# Patient Record
Sex: Female | Born: 1950 | Race: White | Hispanic: No | Smoking: Never smoker
Health system: Southern US, Community
[De-identification: ages and names within clinical notes are randomized; demographics above are authoritative.]

## PROBLEM LIST (undated history)

## (undated) DIAGNOSIS — R87629 Unspecified abnormal cytological findings in specimens from vagina: Secondary | ICD-10-CM

## (undated) DIAGNOSIS — G709 Myoneural disorder, unspecified: Secondary | ICD-10-CM

## (undated) DIAGNOSIS — M1990 Unspecified osteoarthritis, unspecified site: Secondary | ICD-10-CM

## (undated) DIAGNOSIS — I10 Essential (primary) hypertension: Secondary | ICD-10-CM

## (undated) DIAGNOSIS — K219 Gastro-esophageal reflux disease without esophagitis: Secondary | ICD-10-CM

## (undated) DIAGNOSIS — F419 Anxiety disorder, unspecified: Secondary | ICD-10-CM

## (undated) DIAGNOSIS — F32A Depression, unspecified: Secondary | ICD-10-CM

## (undated) DIAGNOSIS — E119 Type 2 diabetes mellitus without complications: Secondary | ICD-10-CM

## (undated) HISTORY — DX: Gastro-esophageal reflux disease without esophagitis: K21.9

## (undated) HISTORY — DX: Type 2 diabetes mellitus without complications: E11.9

## (undated) HISTORY — DX: Anxiety disorder, unspecified: F41.9

## (undated) HISTORY — PX: ABDOMINAL HYSTERECTOMY: SHX81

## (undated) HISTORY — PX: CERVIX LESION DESTRUCTION: SHX591

## (undated) HISTORY — DX: Essential (primary) hypertension: I10

## (undated) HISTORY — PX: JOINT REPLACEMENT: SHX530

## (undated) HISTORY — DX: Depression, unspecified: F32.A

## (undated) HISTORY — DX: Unspecified abnormal cytological findings in specimens from vagina: R87.629

## (undated) HISTORY — DX: Unspecified osteoarthritis, unspecified site: M19.90

## (undated) HISTORY — PX: ARTHROPLASTY: SHX135

## (undated) HISTORY — DX: Myoneural disorder, unspecified: G70.9

## (undated) HISTORY — PX: TUBAL LIGATION: SHX77

---

## 1994-03-19 DIAGNOSIS — I10 Essential (primary) hypertension: Secondary | ICD-10-CM

## 1994-03-19 HISTORY — DX: Essential (primary) hypertension: I10

## 2005-09-17 ENCOUNTER — Encounter: Payer: Self-pay | Admitting: Family Medicine

## 2005-10-16 ENCOUNTER — Ambulatory Visit: Payer: Self-pay | Admitting: Family Medicine

## 2005-11-08 ENCOUNTER — Ambulatory Visit: Payer: Self-pay | Admitting: Family Medicine

## 2005-12-20 ENCOUNTER — Ambulatory Visit: Payer: Self-pay | Admitting: Family Medicine

## 2005-12-25 DIAGNOSIS — G4700 Insomnia, unspecified: Secondary | ICD-10-CM | POA: Insufficient documentation

## 2005-12-25 DIAGNOSIS — I10 Essential (primary) hypertension: Secondary | ICD-10-CM | POA: Insufficient documentation

## 2005-12-25 DIAGNOSIS — F339 Major depressive disorder, recurrent, unspecified: Secondary | ICD-10-CM | POA: Insufficient documentation

## 2005-12-31 ENCOUNTER — Encounter: Payer: Self-pay | Admitting: Family Medicine

## 2006-03-06 ENCOUNTER — Ambulatory Visit: Payer: Self-pay | Admitting: Family Medicine

## 2006-03-21 ENCOUNTER — Ambulatory Visit: Payer: Self-pay | Admitting: Family Medicine

## 2006-05-20 ENCOUNTER — Ambulatory Visit: Payer: Self-pay | Admitting: Family Medicine

## 2006-05-20 DIAGNOSIS — G2581 Restless legs syndrome: Secondary | ICD-10-CM | POA: Insufficient documentation

## 2006-06-14 ENCOUNTER — Telehealth: Payer: Self-pay | Admitting: Family Medicine

## 2006-07-08 ENCOUNTER — Ambulatory Visit: Payer: Self-pay | Admitting: Family Medicine

## 2006-09-04 ENCOUNTER — Ambulatory Visit: Payer: Self-pay | Admitting: Family Medicine

## 2006-09-04 DIAGNOSIS — J019 Acute sinusitis, unspecified: Secondary | ICD-10-CM | POA: Insufficient documentation

## 2006-10-03 ENCOUNTER — Ambulatory Visit: Payer: Self-pay | Admitting: Family Medicine

## 2006-11-15 ENCOUNTER — Telehealth: Payer: Self-pay | Admitting: Family Medicine

## 2006-11-19 ENCOUNTER — Ambulatory Visit: Payer: Self-pay | Admitting: Family Medicine

## 2006-11-19 DIAGNOSIS — H66009 Acute suppurative otitis media without spontaneous rupture of ear drum, unspecified ear: Secondary | ICD-10-CM | POA: Insufficient documentation

## 2006-11-27 ENCOUNTER — Telehealth (INDEPENDENT_AMBULATORY_CARE_PROVIDER_SITE_OTHER): Payer: Self-pay | Admitting: *Deleted

## 2006-11-28 ENCOUNTER — Ambulatory Visit: Payer: Self-pay | Admitting: Family Medicine

## 2006-12-12 ENCOUNTER — Ambulatory Visit: Payer: Self-pay | Admitting: Family Medicine

## 2006-12-12 ENCOUNTER — Encounter: Payer: Self-pay | Admitting: Family Medicine

## 2006-12-12 DIAGNOSIS — R5383 Other fatigue: Secondary | ICD-10-CM

## 2006-12-12 DIAGNOSIS — R5381 Other malaise: Secondary | ICD-10-CM | POA: Insufficient documentation

## 2006-12-13 ENCOUNTER — Encounter: Payer: Self-pay | Admitting: Family Medicine

## 2006-12-13 LAB — CONVERTED CEMR LAB
Albumin: 4.2 g/dL (ref 3.5–5.2)
CO2: 24 meq/L (ref 19–32)
Calcium: 9.6 mg/dL (ref 8.4–10.5)
Chloride: 104 meq/L (ref 96–112)
Cholesterol, target level: 200 mg/dL
Cholesterol: 219 mg/dL — ABNORMAL HIGH (ref 0–200)
Glucose, Bld: 102 mg/dL — ABNORMAL HIGH (ref 70–99)
HDL goal, serum: 40 mg/dL
Hemoglobin: 12.7 g/dL (ref 12.0–15.0)
LDL Goal: 160 mg/dL
MCV: 93.8 fL (ref 78.0–100.0)
Potassium: 4 meq/L (ref 3.5–5.3)
RDW: 12.9 % (ref 11.5–14.0)
Sodium: 141 meq/L (ref 135–145)
TSH: 1.596 microintl units/mL (ref 0.350–5.50)
Total CHOL/HDL Ratio: 5.2
Total Protein: 7.1 g/dL (ref 6.0–8.3)

## 2007-01-13 ENCOUNTER — Ambulatory Visit: Payer: Self-pay | Admitting: Family Medicine

## 2007-01-13 ENCOUNTER — Encounter: Admission: RE | Admit: 2007-01-13 | Discharge: 2007-01-13 | Payer: Self-pay | Admitting: Family Medicine

## 2007-01-13 DIAGNOSIS — R05 Cough: Secondary | ICD-10-CM

## 2007-01-13 DIAGNOSIS — R059 Cough, unspecified: Secondary | ICD-10-CM | POA: Insufficient documentation

## 2007-01-20 ENCOUNTER — Telehealth: Payer: Self-pay | Admitting: Family Medicine

## 2007-02-18 ENCOUNTER — Telehealth: Payer: Self-pay | Admitting: Family Medicine

## 2007-11-03 ENCOUNTER — Encounter: Admission: RE | Admit: 2007-11-03 | Discharge: 2007-11-03 | Payer: Self-pay | Admitting: Family Medicine

## 2007-11-03 ENCOUNTER — Ambulatory Visit: Payer: Self-pay | Admitting: Family Medicine

## 2007-11-03 DIAGNOSIS — R112 Nausea with vomiting, unspecified: Secondary | ICD-10-CM | POA: Insufficient documentation

## 2007-11-06 LAB — CONVERTED CEMR LAB
ALT: 11 units/L (ref 0–35)
AST: 15 units/L (ref 0–37)
Alkaline Phosphatase: 82 units/L (ref 39–117)
BUN: 23 mg/dL (ref 6–23)
Basophils Relative: 1 % (ref 0–1)
Creatinine, Ser: 1.06 mg/dL (ref 0.40–1.20)
Eosinophils Absolute: 0.1 10*3/uL (ref 0.0–0.7)
Eosinophils Relative: 1 % (ref 0–5)
Glucose, Bld: 137 mg/dL — ABNORMAL HIGH (ref 70–99)
Lymphs Abs: 2.5 10*3/uL (ref 0.7–4.0)
MCV: 94.8 fL (ref 78.0–100.0)
Neutro Abs: 7.1 10*3/uL (ref 1.7–7.7)
RBC: 4.6 M/uL (ref 3.87–5.11)
RDW: 12.9 % (ref 11.5–15.5)
Total Bilirubin: 0.4 mg/dL (ref 0.3–1.2)
Total Protein: 8 g/dL (ref 6.0–8.3)
WBC: 10.5 10*3/uL (ref 4.0–10.5)

## 2007-11-13 ENCOUNTER — Telehealth: Payer: Self-pay | Admitting: Family Medicine

## 2007-12-23 ENCOUNTER — Encounter: Payer: Self-pay | Admitting: Family Medicine

## 2008-06-22 ENCOUNTER — Telehealth: Payer: Self-pay | Admitting: Family Medicine

## 2008-07-26 ENCOUNTER — Telehealth (INDEPENDENT_AMBULATORY_CARE_PROVIDER_SITE_OTHER): Payer: Self-pay | Admitting: *Deleted

## 2008-12-08 ENCOUNTER — Encounter: Admission: RE | Admit: 2008-12-08 | Discharge: 2008-12-08 | Payer: Self-pay | Admitting: Family Medicine

## 2008-12-08 ENCOUNTER — Ambulatory Visit: Payer: Self-pay | Admitting: Family Medicine

## 2008-12-08 DIAGNOSIS — M79609 Pain in unspecified limb: Secondary | ICD-10-CM | POA: Insufficient documentation

## 2008-12-14 ENCOUNTER — Encounter: Admission: RE | Admit: 2008-12-14 | Discharge: 2008-12-14 | Payer: Self-pay | Admitting: Family Medicine

## 2010-08-15 ENCOUNTER — Telehealth: Payer: Self-pay | Admitting: Family Medicine

## 2010-08-15 NOTE — Telephone Encounter (Signed)
Additional hernia she needs an appointment. It's been a long time since she's been here and she does not have up-to-date labs being that she is on losartan. If she has changed physicians please let us know.

## 2010-08-16 NOTE — Telephone Encounter (Signed)
Called and was unable to leave a message

## 2010-08-21 NOTE — Telephone Encounter (Signed)
Called and left message on vm

## 2010-11-23 ENCOUNTER — Ambulatory Visit (INDEPENDENT_AMBULATORY_CARE_PROVIDER_SITE_OTHER): Payer: Managed Care, Other (non HMO) | Admitting: Family Medicine

## 2010-11-23 VITALS — BP 126/86 | HR 118 | Wt 141.0 lb

## 2010-11-23 DIAGNOSIS — F339 Major depressive disorder, recurrent, unspecified: Secondary | ICD-10-CM

## 2010-11-23 MED ORDER — SERTRALINE HCL 25 MG PO TABS: 25.0000 mg | ORAL_TABLET | Freq: Every day | ORAL | Status: DC

## 2010-11-23 NOTE — Progress Notes (Signed)
  Subjective:    Patient ID: Bianca Mckee, female    DOB: 1951/02/16, 60 y.o.   MRN: [id number]  HPI Feels her mood has been worse for the last few month. Worse the last few weeks.  Sys has been more fidgety.  She moved out with living with her father and this has helped her mentally. But now stressed because she is not there to help himm. Not in a relationship. She says she doesn't want to go out socially and now having a hard time going to work in the mornings.  She feels socially isolated.  She does occ go out to eat with a girlfirend.  Won't leave house the entire weekend unti lhas to go back to work on Monday.  Did Well on the paxil but caused some weight gain. Felt fluoxetine didn't help her.  She would like to try sertraline. Waking up frequently at night.  Has been using melatonin at night.   Says she was dx with elevated sugas but not DM last April. She said she has blood work done then as well.   Review of Systems     Objective:   Physical Exam  Constitutional: She is oriented to person, place, and time. She appears well-developed and well-nourished.  HENT:  Head: Normocephalic and atraumatic.  Cardiovascular: Normal rate, regular rhythm and normal heart sounds.   Pulmonary/Chest: Effort normal and breath sounds normal.  Neurological: She is alert and oriented to person, place, and time.  Skin: Skin is warm and dry.  Psychiatric: She has a normal mood and affect. Her behavior is normal.          Assessment & Plan:

## 2010-11-23 NOTE — Assessment & Plan Note (Signed)
PHQ - 9 score of 19 and GAD- 7 score of 19.  Discussed dx. Will start sertraline. F/U in 3 weeks. Discussed potential SE.  Discussed expected course.

## 2010-11-24 ENCOUNTER — Telehealth: Payer: Self-pay | Admitting: *Deleted

## 2010-11-24 MED ORDER — ALPRAZOLAM 1 MG PO TABS: ORAL_TABLET | ORAL | Status: DC

## 2010-11-24 NOTE — Telephone Encounter (Signed)
Pt came into office and asked if you would prescribe her some Xanax until her other meds kicked in- forgot to ask at her appt yesterday. Please advise

## 2010-11-24 NOTE — Telephone Encounter (Signed)
pT NOTIFEID. KJ LPN

## 2010-11-24 NOTE — Telephone Encounter (Signed)
OK will fax over. She has had these before.

## 2010-11-30 ENCOUNTER — Encounter: Payer: Self-pay | Admitting: Family Medicine

## 2010-12-21 ENCOUNTER — Encounter: Payer: Self-pay | Admitting: Family Medicine

## 2010-12-21 ENCOUNTER — Ambulatory Visit (INDEPENDENT_AMBULATORY_CARE_PROVIDER_SITE_OTHER): Payer: Managed Care, Other (non HMO) | Admitting: Family Medicine

## 2010-12-21 VITALS — BP 119/69 | HR 74 | Wt 144.0 lb

## 2010-12-21 DIAGNOSIS — R7309 Other abnormal glucose: Secondary | ICD-10-CM

## 2010-12-21 DIAGNOSIS — E8881 Metabolic syndrome: Secondary | ICD-10-CM

## 2010-12-21 DIAGNOSIS — F339 Major depressive disorder, recurrent, unspecified: Secondary | ICD-10-CM

## 2010-12-21 DIAGNOSIS — G4700 Insomnia, unspecified: Secondary | ICD-10-CM

## 2010-12-21 LAB — POCT GLYCOSYLATED HEMOGLOBIN (HGB A1C): Hemoglobin A1C: 6.3

## 2010-12-21 MED ORDER — GLUCOSE BLOOD VI STRP: ORAL_STRIP | Status: AC

## 2010-12-21 MED ORDER — BUPROPION HCL ER (XL) 150 MG PO TB24: 150.0000 mg | ORAL_TABLET | ORAL | Status: DC

## 2010-12-21 MED ORDER — SERTRALINE HCL 25 MG PO TABS: 25.0000 mg | ORAL_TABLET | Freq: Every day | ORAL | Status: DC

## 2010-12-21 NOTE — Patient Instructions (Signed)
Decrease sertraline to one a day for one week, then every other day for for one week, then stop.

## 2010-12-21 NOTE — Progress Notes (Signed)
  Subjective:    Patient ID: Bianca Mckee, female    DOB: 1951/03/15, 60 y.o.   MRN: [id number]  HPI Insomnia - Alteril for sleep.  It has melatonin and valerian root as well. Says not sleeping well  Says helps some nights but not consistent.  Night time hot flashes have started again.    Tried the sertaline and says did well. Says her mood has been better. No SE ucrrently. She has gained 3 lbs and she is worried about it.  She is interested in changing to Wellbutrin bc she feels it could also help with her ADD as well.   Hx of abnormal glucose.  Says would like refill on her strips.   Review of Systems     Objective:   Physical Exam  Constitutional: She is oriented to person, place, and time. She appears well-developed and well-nourished.  HENT:  Head: Normocephalic and atraumatic.  Cardiovascular: Normal rate, regular rhythm and normal heart sounds.   Pulmonary/Chest: Effort normal and breath sounds normal.  Neurological: She is alert and oriented to person, place, and time.  Skin: Skin is warm and dry.  Psychiatric: She has a normal mood and affect. Her behavior is normal.          Assessment & Plan:  INsomnia- I really want to get her mood much better and see if that helps sleep as well as her sleep difficulty is from racing thoughts when she tries to go to bed.. Can continue the herbal supplement since helps some.    Depression - PHQ- 9 score of 7 today which is better.  She feels pretty sure that the sertraline is causing weight gain he finishes a very low dose. She would really like to try switching to Wellbutrin. I have no problem with this but I did warn her that the Wellbutrin may or may not be enough to control moderate to severe symptoms. Her father was recently put in a nursing home and this should take a lot of added stress off of her since she was his primary caretaker. Hopefully this will help as well. We will start Wellbutrin 150 mg extended release once a day.  Followup in 6 weeks.  Abnormal glucose-she said that she was told by her nephrologist that her glucose was elevated. Since and she's made significant changes to lose a large amount of weight which she has unsuccessfully. She would like a new prescription for the strips. Also check an A1c today. 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 Recheck in 6 months. Continue to eat healthy diet and maintain a healthy weight.

## 2011-01-04 ENCOUNTER — Telehealth: Payer: Self-pay | Admitting: Family Medicine

## 2011-01-04 NOTE — Telephone Encounter (Signed)
Pt called and was recently placed on wellbutrin 150 mg am and switched from zoloft med.  Pt says she can't sleep at night, jittery inside, and med is making more nervous.  Pt is requesting to change back to original med or do something diff. Plan:  Routed encounter to Dr. Thurmond Butts as pt is to return in 6 wks from 12-21-10, but pt stated she couldn't wait that long to change the medication. Jarvis Newcomer, LPN [facility]

## 2011-01-05 NOTE — Telephone Encounter (Signed)
Bianca Mckee I would keep the wellbutrin onboard since it has other useful properties but we can restart the zoloft again for her anxiety. . She may take both it looks like she was on 25 mg daily. She can have 30 w/4 refills.

## 2011-01-08 ENCOUNTER — Telehealth: Payer: Self-pay | Admitting: Family Medicine

## 2011-01-08 NOTE — Telephone Encounter (Signed)
The jitteriness is usually the zoloft getting out of her system. This should get better after about a week.  I really don't think this is the wellbutrin.  But if she really wants to go back on th zoloft she can. We can always try a lower dose.

## 2011-01-08 NOTE — Telephone Encounter (Signed)
Pt last took zoloft this past Saturday and when she was notified this am she said she spoke to [place] last Friday and she told her to take the wellbutrin every other day.  Pt would prefer Dr. Linford Arnold evaluate message because the pt feels the wellbutrin is causing the jitters.  Please advise. Plan:  Routed to Dr. Linford Arnold. Jarvis Newcomer, LPN [facility]

## 2011-01-08 NOTE — Telephone Encounter (Signed)
Re sent again to Dr. Linford Arnold. Jarvis Newcomer, LPN [facility]

## 2011-01-09 NOTE — Telephone Encounter (Signed)
Closed

## 2011-01-09 NOTE — Telephone Encounter (Signed)
Pt notified with instructions.  Told to try to hang in there for another week to see if jitterness improves.  If not told to call back. Jarvis Newcomer, LPN [facility]

## 2011-02-01 ENCOUNTER — Encounter: Payer: Self-pay | Admitting: Family Medicine

## 2011-02-01 ENCOUNTER — Ambulatory Visit (INDEPENDENT_AMBULATORY_CARE_PROVIDER_SITE_OTHER): Payer: Managed Care, Other (non HMO) | Admitting: Family Medicine

## 2011-02-01 VITALS — BP 162/101 | HR 86 | Wt 145.0 lb

## 2011-02-01 DIAGNOSIS — R635 Abnormal weight gain: Secondary | ICD-10-CM

## 2011-02-01 DIAGNOSIS — F329 Major depressive disorder, single episode, unspecified: Secondary | ICD-10-CM

## 2011-02-01 DIAGNOSIS — F32A Depression, unspecified: Secondary | ICD-10-CM

## 2011-02-01 MED ORDER — CITALOPRAM HYDROBROMIDE 20 MG PO TABS: 20.0000 mg | ORAL_TABLET | Freq: Every day | ORAL | Status: DC

## 2011-02-01 NOTE — Progress Notes (Signed)
  Subjective:    Patient ID: Bianca Mckee, female    DOB: 06-08-1950, 60 y.o.   MRN: [id number]  HPI Says still hasnt lost weight. She is eating healthy and says she is not exercising regularly.  She was down to 135 lb and wants to get back to that.  She switched to the Wellbutrin and initally had shakes and insomnia but that has gotten better.  She has also had dry mouth and constipation. She ran out of the wellbutrin a week ago.     Review of Systems     Objective:   Physical Exam  Constitutional: She is oriented to person, place, and time. She appears well-developed and well-nourished.  HENT:  Head: Normocephalic and atraumatic.  Cardiovascular: Normal rate, regular rhythm and normal heart sounds.   Pulmonary/Chest: Effort normal and breath sounds normal.  Neurological: She is alert and oriented to person, place, and time.  Skin: Skin is warm and dry.  Psychiatric: She has a normal mood and affect. Her behavior is normal.          Assessment & Plan:  Depression  - PHQ-9 score is 6. Discussed options. She feels the sertraline works better than the wellbutrin but was gaining wt on the sertraline. After much discussion we decided to try citalopram. We can always go back to the sertraline if no success with the citalopram. F.U. 1 mo.

## 2011-02-01 NOTE — Patient Instructions (Signed)
Start with half a tab 1/2 tab in the morning for one week then increase to whole tab daily.

## 2011-03-01 ENCOUNTER — Ambulatory Visit (INDEPENDENT_AMBULATORY_CARE_PROVIDER_SITE_OTHER): Payer: Managed Care, Other (non HMO) | Admitting: Family Medicine

## 2011-03-01 ENCOUNTER — Other Ambulatory Visit: Payer: Self-pay | Admitting: Family Medicine

## 2011-03-01 ENCOUNTER — Encounter: Payer: Self-pay | Admitting: Family Medicine

## 2011-03-01 VITALS — BP 105/67 | HR 89 | Wt 146.0 lb

## 2011-03-01 DIAGNOSIS — K5900 Constipation, unspecified: Secondary | ICD-10-CM

## 2011-03-01 DIAGNOSIS — F329 Major depressive disorder, single episode, unspecified: Secondary | ICD-10-CM

## 2011-03-01 DIAGNOSIS — Z1231 Encounter for screening mammogram for malignant neoplasm of breast: Secondary | ICD-10-CM

## 2011-03-01 DIAGNOSIS — M62838 Other muscle spasm: Secondary | ICD-10-CM

## 2011-03-01 DIAGNOSIS — F32A Depression, unspecified: Secondary | ICD-10-CM

## 2011-03-01 MED ORDER — CITALOPRAM HYDROBROMIDE 10 MG PO TABS: 10.0000 mg | ORAL_TABLET | Freq: Every day | ORAL | Status: DC

## 2011-03-01 NOTE — Progress Notes (Signed)
Subjective:    Patient ID: Bianca Mckee, female    DOB: May 05, 1950, 60 y.o.   MRN: [id number]  HPI F/u depression - Celexa is working well and sleeping well, but feels too sedated during the daytime.  She really liked the sertraline the past but says that it caused significant weight gain which is why we discontinued it.  Still very constipated.  Tried a stool softener and that has not helped. She wants to know what else to try. Her last bowel movement was 5 days ago.  Neck tightness. Approximately one month. She says she's been getting more frequent headaches from it. She can feel the tightness in the muscles in her neck. No trauma or injury. She has not tried heat or massage. She does occasionally try anti-inflammatory which helps some. She said getting in the shower and running the pulse setting on the shower head does seem to help.  Review of Systems BP 105/67  Pulse 89  Wt 146 lb (66.225 kg)    Allergies  Allergen Reactions  . Codeine Phosphate     Past Medical History  Diagnosis Date  . Hypertension 1996  . Anxiety   . GERD (gastroesophageal reflux disease)     Past Surgical History  Procedure Date  . Tubal ligation     History   Social History  . Marital Status: Divorced    Spouse Name: N/A    Number of Children: N/A  . Years of Education: N/A   Occupational History  . Not on file.   Social History Main Topics  . Smoking status: Never Smoker   . Smokeless tobacco: Not on file  . Alcohol Use: No  . Drug Use: No  . Sexually Active:    Other Topics Concern  . Not on file   Social History Narrative  . No narrative on file    No family history on file.    Objective:   Physical Exam  Constitutional: She is oriented to person, place, and time. She appears well-developed and well-nourished.  HENT:  Head: Normocephalic and atraumatic.  Cardiovascular: Normal rate, regular rhythm and normal heart sounds.   Pulmonary/Chest: Effort normal and breath  sounds normal.  Musculoskeletal:       Neck with normal flexion and extension. Decreased rotation right and left. Normal side bending. I can actually feel the knot of spasms on the left occiput area. She is mildly tender over the cervical paraspinous muscles.  Neurological: She is oriented to person, place, and time.  Skin: Skin is warm and dry.  Psychiatric: She has a normal mood and affect. Her behavior is normal.          Assessment & Plan:  Depression - PHQ-9 score of 6.  Her depression score has not changed and I do think she is doing well Ms. Celexa but I am concerned about the excess sedation. We will try decreasing her dose to 10 mg for couple weeks and see if this still helps her mood but avoids the daytime sedation. I also explained to her that we can always go back to the sertraline for a time.  Neck spasm - Rec masage and heating pad. Upon gentle stretching. If no significant improvement in the next couple weeks and please let me know and we can place her into formal physical therapy. Since there was no known injury off on x-ray at this point.  Constipation-and posterior get back on her MiraLax which she has used in the past and  has worked well for her. She says she finds it difficult to remember to do it daily. I explained to her that she can at least start using it twice and she has a normal bowel movement and then if she wants to do Benefiber or something like that, they do come in individual packets that you can put in her purse.

## 2011-03-01 NOTE — Patient Instructions (Signed)
Miralax daily or consider adding a fiber supplement to help with your constipation.

## 2011-03-02 ENCOUNTER — Other Ambulatory Visit: Payer: Self-pay | Admitting: Family Medicine

## 2011-03-29 ENCOUNTER — Ambulatory Visit (INDEPENDENT_AMBULATORY_CARE_PROVIDER_SITE_OTHER): Payer: Managed Care, Other (non HMO) | Admitting: Family Medicine

## 2011-03-29 ENCOUNTER — Encounter: Payer: Self-pay | Admitting: Family Medicine

## 2011-03-29 VITALS — BP 121/64 | HR 104 | Wt 145.0 lb

## 2011-03-29 DIAGNOSIS — F32A Depression, unspecified: Secondary | ICD-10-CM

## 2011-03-29 DIAGNOSIS — H9319 Tinnitus, unspecified ear: Secondary | ICD-10-CM

## 2011-03-29 DIAGNOSIS — F329 Major depressive disorder, single episode, unspecified: Secondary | ICD-10-CM

## 2011-03-29 DIAGNOSIS — F3289 Other specified depressive episodes: Secondary | ICD-10-CM

## 2011-03-29 MED ORDER — CITALOPRAM HYDROBROMIDE 20 MG PO TABS: 20.0000 mg | ORAL_TABLET | Freq: Every day | ORAL | Status: DC

## 2011-03-29 NOTE — Progress Notes (Signed)
  Subjective:    Patient ID: Bianca Mckee, female    DOB: 05/04/1950, 61 y.o.   MRN: [id number]  HPI Depression - She says feels almost paranoid that her manager is treating her differently.  Doesn't feel this way at home. Say no longer feels as tired on th emedication. She dropped down to the 10mg  for a couple of weeks and then decdied to go back up to the 20mg  tab on her own bc felt it was working better.   Left ear buzzing x 3 weeks. Normal hearing. No pain. No drainage. Never had before. No trauma.    Review of Systems     Objective:   Physical Exam  Constitutional: She is oriented to person, place, and time. She appears well-developed and well-nourished.  HENT:  Head: Normocephalic and atraumatic.       Righ and Left TM are normal.   Neurological: She is alert and oriented to person, place, and time.  Skin: Skin is warm and dry.  Psychiatric: She has a normal mood and affect. Her behavior is normal.          Assessment & Plan:  Depression - PHQ- 9 score of 4. Discussed different options. She finally decided to continue current regimen. F/U in 1 month  HTN - BP looks great.    Ear ringing - Exam is normal. No pain,.   giveiit a couple of more weeks to resolved. If not then consider ENT referral.  Gave reassurance.

## 2011-04-17 ENCOUNTER — Inpatient Hospital Stay: Admission: RE | Admit: 2011-04-17 | Payer: Managed Care, Other (non HMO) | Source: Ambulatory Visit

## 2011-04-30 ENCOUNTER — Ambulatory Visit: Payer: Managed Care, Other (non HMO) | Admitting: Family Medicine

## 2011-05-02 ENCOUNTER — Encounter: Payer: Self-pay | Admitting: *Deleted

## 2011-05-07 ENCOUNTER — Other Ambulatory Visit: Payer: Self-pay | Admitting: Family Medicine

## 2011-05-10 ENCOUNTER — Encounter: Payer: Self-pay | Admitting: Family Medicine

## 2011-05-10 ENCOUNTER — Ambulatory Visit (INDEPENDENT_AMBULATORY_CARE_PROVIDER_SITE_OTHER): Payer: Managed Care, Other (non HMO) | Admitting: Family Medicine

## 2011-05-10 DIAGNOSIS — G4700 Insomnia, unspecified: Secondary | ICD-10-CM

## 2011-05-10 DIAGNOSIS — F329 Major depressive disorder, single episode, unspecified: Secondary | ICD-10-CM

## 2011-05-10 DIAGNOSIS — F32A Depression, unspecified: Secondary | ICD-10-CM

## 2011-05-10 MED ORDER — CITALOPRAM HYDROBROMIDE 20 MG PO TABS: 20.0000 mg | ORAL_TABLET | Freq: Every day | ORAL | Status: DC

## 2011-05-10 NOTE — Progress Notes (Signed)
  Subjective:    Patient ID: Bianca Mckee, female    DOB: [birth year], 61 y.o.   MRN: [id number]  HPI  F/U depression - Says her father passed away since last here. That ha sbeen stressful but says she is at peace with it bc she didn't want him to suffer. He had been very ill. Still not feeling well.  She inc her citalopram to 20mg  on her own and thinks it may be helping her mood.  Her weight is stable. Still using Tylenol PM at night.  Helps sometimes. When she really can't sleep she will take a half of her Xanax and she's not getting to sleep for one to 2 hours. If that doesn't work she will get up again and take the other half but then by the morning she's had for a few hours sleep that she feels too sedated to get out of bed.  Review of Systems     Objective:   Physical Exam  Constitutional: She appears well-developed and well-nourished.  HENT:  Head: Normocephalic and atraumatic.  Skin: Skin is warm and dry.  Psychiatric: She has a normal mood and affect. Her behavior is normal.          Assessment & Plan:  Depression- PHQ-9 score of 7.  Doing well consider the deaht of her father. I asked her to  please call the office if she feels she would benefit from grief counseling. She says right now she feels that she's doing okay. I did rewrite her prescription for citalopram 20 mg dose. Followup in 3 months. This lasted, she has any concerns or problems.  INsmonia -dicussed options.  Discusse can use her xanax at bdtime for 4-5 days to try to reset her sleep cycle. If not then consider low dose ambien. Used seroquel for mood and sleep in the past.

## 2011-05-15 ENCOUNTER — Ambulatory Visit
Admission: RE | Admit: 2011-05-15 | Discharge: 2011-05-15 | Disposition: A | Discharge status: Home / Self Care | Payer: Managed Care, Other (non HMO) | Source: Ambulatory Visit | Attending: Family Medicine | Admitting: Family Medicine

## 2011-05-15 DIAGNOSIS — Z1231 Encounter for screening mammogram for malignant neoplasm of breast: Secondary | ICD-10-CM

## 2011-05-18 ENCOUNTER — Telehealth: Payer: Self-pay | Admitting: Family Medicine

## 2011-05-18 ENCOUNTER — Telehealth: Payer: Self-pay | Admitting: *Deleted

## 2011-05-18 MED ORDER — CITALOPRAM HYDROBROMIDE 40 MG PO TABS: 40.0000 mg | ORAL_TABLET | Freq: Every day | ORAL | Status: DC

## 2011-05-18 NOTE — Telephone Encounter (Signed)
Please call patient. Normal mammogram.  Repeat in 1 year.  

## 2011-05-18 NOTE — Telephone Encounter (Signed)
RX sent to pharmacy  

## 2011-05-18 NOTE — Telephone Encounter (Signed)
Pt states that she has been taking her Celexa 2 tabs, total of 40mg  when she thought she was taking 20mg . She would like to know if we can send a rx for the 40mg  since that is what she has been taking. States that she thought her tabs were 10mg  but they are 20mg . Please advise.

## 2011-05-18 NOTE — Telephone Encounter (Signed)
Ok to sent the 40mg  generic tabs.

## 2011-05-21 NOTE — Telephone Encounter (Signed)
Pt informed

## 2011-08-07 ENCOUNTER — Ambulatory Visit (INDEPENDENT_AMBULATORY_CARE_PROVIDER_SITE_OTHER): Payer: Self-pay | Admitting: Family Medicine

## 2011-08-07 DIAGNOSIS — F329 Major depressive disorder, single episode, unspecified: Secondary | ICD-10-CM

## 2011-08-07 DIAGNOSIS — F32A Depression, unspecified: Secondary | ICD-10-CM

## 2011-08-07 DIAGNOSIS — I10 Essential (primary) hypertension: Secondary | ICD-10-CM

## 2011-08-07 MED ORDER — CITALOPRAM HYDROBROMIDE 40 MG PO TABS: 40.0000 mg | ORAL_TABLET | Freq: Every day | ORAL | Status: DC

## 2011-08-07 MED ORDER — TRIAMTERENE-HCTZ 75-50 MG PO TABS: 1.0000 | ORAL_TABLET | Freq: Every day | ORAL | Status: DC

## 2011-08-07 NOTE — Progress Notes (Signed)
  Subjective:    Patient ID: Bianca Mckee, female    DOB: March 13, 1951, 61 y.o.   MRN: [id number]  HPI Depression - Lost her job in March. She is doing on celexa. No SE. Sleep is fair. Looking for a new job.  No CP or SOB. Needs refills  HTN - Recently changed to micardis by Dr. Valerie Roys.  Says getting samples for right now.     Review of Systems     Objective:   Physical Exam  Constitutional: She is oriented to person, place, and time. She appears well-developed and well-nourished.  HENT:  Head: Normocephalic and atraumatic.  Cardiovascular: Normal rate, regular rhythm and normal heart sounds.   Pulmonary/Chest: Effort normal and breath sounds normal.  Neurological: She is alert and oriented to person, place, and time.  Skin: Skin is warm and dry.  Psychiatric: She has a normal mood and affect. Her behavior is normal.          Assessment & Plan:  Depression  - Doing well. Send refills. F/U in 6 months.  Doing well. Hopefully will find a new job soon.   HTN - Well controlled. F/U in 6 months.  Check BMP at that time.

## 2011-08-07 NOTE — Patient Instructions (Signed)
Call me if needs samples.

## 2012-02-07 ENCOUNTER — Ambulatory Visit: Payer: Self-pay | Admitting: Family Medicine

## 2012-08-07 LAB — HEMOGLOBIN A1C: Hgb A1c MFr Bld: 6.5 % — AB (ref 4.0–6.0)

## 2012-08-07 LAB — LIPID PANEL: Cholesterol: 213 mg/dL — AB (ref 0–200)

## 2012-11-27 ENCOUNTER — Other Ambulatory Visit: Payer: Self-pay | Admitting: Family Medicine

## 2012-11-27 DIAGNOSIS — Z139 Encounter for screening, unspecified: Secondary | ICD-10-CM

## 2012-12-01 ENCOUNTER — Encounter: Payer: Self-pay | Admitting: Family Medicine

## 2012-12-01 ENCOUNTER — Ambulatory Visit (INDEPENDENT_AMBULATORY_CARE_PROVIDER_SITE_OTHER): Payer: BC Managed Care – PPO | Admitting: Family Medicine

## 2012-12-01 VITALS — BP 109/66 | HR 90 | Wt 154.0 lb

## 2012-12-01 DIAGNOSIS — Z0000 Encounter for general adult medical examination without abnormal findings: Secondary | ICD-10-CM

## 2012-12-01 DIAGNOSIS — E8881 Metabolic syndrome: Secondary | ICD-10-CM

## 2012-12-01 MED ORDER — ALPRAZOLAM 1 MG PO TABS: ORAL_TABLET | ORAL | Status: DC

## 2012-12-01 MED ORDER — CITALOPRAM HYDROBROMIDE 40 MG PO TABS: 40.0000 mg | ORAL_TABLET | Freq: Every day | ORAL | Status: DC

## 2012-12-01 MED ORDER — METFORMIN HCL 500 MG PO TABS: 500.0000 mg | ORAL_TABLET | Freq: Two times a day (BID) | ORAL | Status: DC

## 2012-12-01 NOTE — Progress Notes (Signed)
Subjective:     Bianca Mckee is a 62 y.o. female and is here for a comprehensive physical exam. The patient reports no problems. Left shoulder pain x 1 month ago.  Was painful when she reached above her head.  Say was painful with activity.  She says she took Aleve and it actually did provide some relief. She still has a little bit of soreness there but overall is better. She's able to reach above her head and 6 her hair without any difficulty. She said she felt like the muscle would give out at times when it was really painful. She try to do some stretches on her own at home. No injuries or trauma.  History   Social History  . Marital Status: Divorced    Spouse Name: N/A    Number of Children: N/A  . Years of Education: N/A   Occupational History  . Not on file.   Social History Main Topics  . Smoking status: Never Smoker   . Smokeless tobacco: Not on file  . Alcohol Use: No  . Drug Use: No  . Sexual Activity:    Other Topics Concern  . Not on file   Social History Narrative  . No narrative on file   Health Maintenance  Topic Date Due  . Influenza Vaccine  08/31/2013  . Mammogram  05/14/2013  . Pap Smear  02/18/2014  . Colonoscopy  11/22/2020  . Tetanus/tdap  11/22/2020    The following portions of the patient's history were reviewed and updated as appropriate: allergies, current medications, past family history, past medical history, past social history, past surgical history and problem list.  Review of Systems A comprehensive review of systems was negative.   Objective:    BP 109/66  Pulse 90  Wt 154 lb (69.854 kg)  BMI 26.85 kg/m2 General appearance: alert, cooperative and appears stated age Head: Normocephalic, without obvious abnormality, atraumatic Eyes: conj clear, EOMi, PEERLA Ears: normal TM's and external ear canals both ears Nose: Nares normal. Septum midline. Mucosa normal. No drainage or sinus tenderness. Throat: lips, mucosa, and tongue  normal; teeth and gums normal Neck: no adenopathy, no carotid bruit, no JVD, supple, symmetrical, trachea midline and thyroid not enlarged, symmetric, no tenderness/mass/nodules Back: symmetric, no curvature. ROM normal. No CVA tenderness. Lungs: clear to auscultation bilaterally Heart: regular rate and rhythm, S1, S2 normal, no murmur, click, rub or gallop Abdomen: soft, non-tender; bowel sounds normal; no masses,  no organomegaly Extremities: extremities normal, atraumatic, no cyanosis or edema. Left shoulder with normal range of motion. She's slightly weaker on the left side with empty can test compared to the right. Otherwise strength in the shoulder elbow and wrist is 5 out of 5. Reflexes in both upper extremities's 2+ and reflexes in both lower extremities is 2+. Pulses: 2+ and symmetric Skin: Skin color, texture, turgor normal. No rashes or lesions Lymph nodes: Cervical adenopathy: nl and Supraclavicular adenopathy: nl Neurologic: Alert and oriented X 3, normal strength and tone. Normal symmetric reflexes. Normal coordination and gait    Assessment:    Healthy female exam.      Plan:     See After Visit Summary for Counseling Recommendations  Keep up a regular exercise program and make sure you are eating a healthy diet Try to eat 4 servings of dairy a day, or if you are lactose intolerant take a calcium with vitamin D daily.  Your vaccines are up to date.   She has mammogram scheduled  for tomorrow. Declined flu vaccine. Had screening lipid panel done through work. Please see scan document. I reviewed her labs with her. Encouraged low fat diet and regular exercise to help improve her cholesterol. Next  Left shoulder pain-likely mild rotator cuff tear or strain. Handout given for exercise. She is already significantly better on her own with some stretching and Aleve. Also consider bursitis. If he suddenly gets worse then please make an appointment.  HTN - Well controlled.   Continue current regimen. She is being followed by cardiology yearly. F/u in 6 months.    Depression - Due for refills on medication.  followup in 6 months.  IFG - A1C is back up to 6.5.  Will restart metformin. F/u in 3-4 months. Work on diet na dexercise.

## 2012-12-02 ENCOUNTER — Ambulatory Visit (INDEPENDENT_AMBULATORY_CARE_PROVIDER_SITE_OTHER): Payer: BC Managed Care – PPO

## 2012-12-02 ENCOUNTER — Encounter: Payer: Self-pay | Admitting: *Deleted

## 2012-12-02 DIAGNOSIS — Z139 Encounter for screening, unspecified: Secondary | ICD-10-CM

## 2012-12-11 ENCOUNTER — Encounter: Payer: Self-pay | Admitting: Family Medicine

## 2013-04-06 ENCOUNTER — Ambulatory Visit: Payer: BC Managed Care – PPO | Admitting: Family Medicine

## 2013-04-07 ENCOUNTER — Ambulatory Visit: Payer: BC Managed Care – PPO | Admitting: Family Medicine

## 2013-04-10 ENCOUNTER — Ambulatory Visit: Payer: BC Managed Care – PPO | Admitting: Family Medicine

## 2013-04-17 ENCOUNTER — Ambulatory Visit (INDEPENDENT_AMBULATORY_CARE_PROVIDER_SITE_OTHER): Payer: BC Managed Care – PPO

## 2013-04-17 ENCOUNTER — Encounter: Payer: Self-pay | Admitting: Family Medicine

## 2013-04-17 ENCOUNTER — Ambulatory Visit (INDEPENDENT_AMBULATORY_CARE_PROVIDER_SITE_OTHER): Payer: BC Managed Care – PPO | Admitting: Family Medicine

## 2013-04-17 VITALS — BP 99/60 | HR 82 | Temp 98.1°F | Ht 64.0 in | Wt 154.0 lb

## 2013-04-17 DIAGNOSIS — M25519 Pain in unspecified shoulder: Secondary | ICD-10-CM

## 2013-04-17 DIAGNOSIS — N189 Chronic kidney disease, unspecified: Secondary | ICD-10-CM

## 2013-04-17 DIAGNOSIS — I10 Essential (primary) hypertension: Secondary | ICD-10-CM

## 2013-04-17 DIAGNOSIS — F339 Major depressive disorder, recurrent, unspecified: Secondary | ICD-10-CM

## 2013-04-17 DIAGNOSIS — M25512 Pain in left shoulder: Secondary | ICD-10-CM

## 2013-04-17 DIAGNOSIS — E8881 Metabolic syndrome: Secondary | ICD-10-CM

## 2013-04-17 LAB — POCT GLYCOSYLATED HEMOGLOBIN (HGB A1C): HEMOGLOBIN A1C: 6

## 2013-04-17 NOTE — Progress Notes (Signed)
Subjective:    Patient ID: Bianca Mckee, female    DOB: [birth year], 63 y.o.   MRN: [id number]  HPI Depression-here to followup on depression. She reports feeling down several days of the week, feels like she sleeps too much, complains of low energy more than half of the days, after eating several days a week. No thoughts of being better off it or harming herself.  Impaired fasting glucose-no increased thirst or urination. No regular exercise. Taking her metformin.     Hypertension- Pt denies chest pain, SOB, dizziness, or heart palpitations.  Taking meds as directed w/o problems.  Denies medication side effects.    Dry skin on forehead. Not painful but says cosmetically bothersome. Using vaseline and aquaphor. mHAs tried to keep hairspray off it.   Review of Systems     BP 99/60  Pulse 82  Temp(Src) 98.1 F (36.7 C)  Ht 5\' 4"  (1.626 m)  Wt 154 lb (69.854 kg)  BMI 26.42 kg/m2  SpO2 93%    Allergies  Allergen Reactions  . Codeine Phosphate     Past Medical History  Diagnosis Date  . Hypertension 1996  . Anxiety   . GERD (gastroesophageal reflux disease)     Past Surgical History  Procedure Laterality Date  . Tubal ligation      History   Social History  . Marital Status: Divorced    Spouse Name: N/A    Number of Children: N/A  . Years of Education: N/A   Occupational History  . Not on file.   Social History Main Topics  . Smoking status: Never Smoker   . Smokeless tobacco: Not on file  . Alcohol Use: No  . Drug Use: No  . Sexual Activity:    Other Topics Concern  . Not on file   Social History Narrative  . No narrative on file    No family history on file.  Outpatient Encounter Prescriptions as of 04/17/2013  Medication Sig  . citalopram (CELEXA) 40 MG tablet Take 1 tablet (40 mg total) by mouth daily.  . metFORMIN (GLUCOPHAGE) 500 MG tablet Take 1 tablet (500 mg total) by mouth 2 (two) times daily with a meal.  . telmisartan (MICARDIS) 40 MG  tablet Take 40 mg by mouth daily.  Marland Kitchen triamterene-hydrochlorothiazide (MAXZIDE) 75-50 MG per tablet Take 1 tablet by mouth daily.  Marland Kitchen ALPRAZolam (XANAX) 1 MG tablet take 1/2 to 1 tablet twice a day if needed       Objective:   Physical Exam  Constitutional: She is oriented to person, place, and time. She appears well-developed and well-nourished.  HENT:  Head: Normocephalic and atraumatic.  Cardiovascular: Normal rate, regular rhythm and normal heart sounds.   Pulmonary/Chest: Effort normal and breath sounds normal.  Neurological: She is alert and oriented to person, place, and time.  Skin: Skin is warm and dry.  Dry flaking skin on her forehead.   Psychiatric: She has a normal mood and affect. Her behavior is normal.          Assessment & Plan:  Depression-PHQ 9 score of 6 today. On celexa.   Overall doing well. Cointinue current regimen.   Impaired fasting glucose- well controlled.  On metformin. Recheck in 6 mo.  Lab Results  Component Value Date   HGBA1C 6.0 04/17/2013    Hypertension-well-controlled.  F/U in 6 months.  Encouraged her to monitpor her BP at home and see if still low. If so then needs to let Dr.  Dilley know.   Dry skin - discussed trial of OTC hydrocortison and running humidifier and drinking plenty of water.

## 2013-04-18 ENCOUNTER — Encounter: Payer: Self-pay | Admitting: Family Medicine

## 2013-04-18 ENCOUNTER — Telehealth: Payer: Self-pay | Admitting: Family Medicine

## 2013-04-18 DIAGNOSIS — N1831 Chronic kidney disease, stage 3a: Secondary | ICD-10-CM | POA: Insufficient documentation

## 2013-04-18 DIAGNOSIS — N183 Chronic kidney disease, stage 3 (moderate): Secondary | ICD-10-CM

## 2013-04-18 LAB — COMPLETE METABOLIC PANEL WITH GFR
ALT: 15 U/L (ref 0–35)
AST: 17 U/L (ref 0–37)
Albumin: 4.4 g/dL (ref 3.5–5.2)
Alkaline Phosphatase: 62 U/L (ref 39–117)
BUN: 24 mg/dL — ABNORMAL HIGH (ref 6–23)
CO2: 28 mEq/L (ref 19–32)
Calcium: 9.5 mg/dL (ref 8.4–10.5)
Chloride: 102 mEq/L (ref 96–112)
Creat: 1.06 mg/dL (ref 0.50–1.10)
GFR, Est African American: 66 mL/min
GFR, Est Non African American: 57 mL/min — ABNORMAL LOW
Glucose, Bld: 89 mg/dL (ref 70–99)
Potassium: 4.1 mEq/L (ref 3.5–5.3)
Sodium: 137 mEq/L (ref 135–145)
Total Bilirubin: 0.3 mg/dL (ref 0.2–1.2)
Total Protein: 7.1 g/dL (ref 6.0–8.3)

## 2013-04-18 LAB — TSH: TSH: 1.754 u[IU]/mL (ref 0.350–4.500)

## 2013-04-18 NOTE — Progress Notes (Signed)
Quick Note:  All labs are normal. ______ 

## 2013-04-18 NOTE — Telephone Encounter (Signed)
Please call Dr. Vivi Barrack office for notes. That is her nephrologist. I don't have anything on file.

## 2013-04-21 NOTE — Telephone Encounter (Signed)
Spoke w/karla @ dr.[email or web address]'s she will fax most recent [place].Audelia Hives [place]

## 2013-04-24 ENCOUNTER — Ambulatory Visit: Payer: BC Managed Care – PPO | Admitting: Family Medicine

## 2013-04-30 ENCOUNTER — Ambulatory Visit: Payer: BC Managed Care – PPO | Admitting: Family Medicine

## 2013-04-30 ENCOUNTER — Ambulatory Visit (INDEPENDENT_AMBULATORY_CARE_PROVIDER_SITE_OTHER): Payer: BC Managed Care – PPO | Admitting: Sports Medicine

## 2013-04-30 ENCOUNTER — Encounter: Payer: Self-pay | Admitting: Sports Medicine

## 2013-04-30 VITALS — BP 115/67 | HR 90 | Ht 64.0 in | Wt 158.0 lb

## 2013-04-30 DIAGNOSIS — M25512 Pain in left shoulder: Secondary | ICD-10-CM | POA: Insufficient documentation

## 2013-04-30 DIAGNOSIS — M25519 Pain in unspecified shoulder: Secondary | ICD-10-CM

## 2013-04-30 NOTE — Assessment & Plan Note (Signed)
Subacromial and glenohumeral joint injections as above. I do think this represents a combination of adhesive capsulitis as well as subacromial bursitis. Formal physical therapy. Return to see me in one month.

## 2013-04-30 NOTE — Progress Notes (Signed)
   Subjective:    I'm seeing this patient as a consultation for:   Dr. Madilyn Fireman  CC: Left shoulder pain  HPI: Bianca Mckee is a pleasant 63 year old female, for the past several weeks she is in excruciating pain she localizes on the anterior aspect of her left shoulder, worse with essentially any movement particularly overhead activities. She denies any trauma, denies neck pain or paresthesias in the left upper extremity. Pain is severe, persistent.  Past medical history, Surgical history, Family history not pertinant except as noted below, Social history, Allergies, and medications have been entered into the medical record, reviewed, and no changes needed.   Review of Systems: No headache, visual changes, nausea, vomiting, diarrhea, constipation, dizziness, abdominal pain, skin rash, fevers, chills, night sweats, weight loss, swollen lymph nodes, body aches, joint swelling, muscle aches, chest pain, shortness of breath, mood changes, visual or auditory hallucinations.   Objective:   General: Well Developed, well nourished, and in no acute distress.  Neuro/Psych: Alert and oriented x3, extra-ocular muscles intact, able to move all 4 extremities, sensation grossly intact. Skin: Warm and dry, no rashes noted.  Respiratory: Not using accessory muscles, speaking in full sentences, trachea midline.  Cardiovascular: Pulses palpable, no extremity edema. Abdomen: Does not appear distended. Left Shoulder: Inspection reveals no abnormalities, atrophy or asymmetry. Palpation is normal with no tenderness over AC joint or bicipital groove. ROM is full in all planes. Rotator cuff strength normal throughout. Positive Neer and Hawkin's tests, empty can sign. Speeds and Yergason's tests normal. There is moderate pain with passive external rotation however range of motion is good. Normal scapular function observed. No painful arc and no drop arm sign. No apprehension sign  Procedure: Real-time Ultrasound  Guided Injection of the left glenohumeral joint Device: GE Logiq E  Verbal informed consent obtained.  Time-out conducted.  Noted no overlying erythema, induration, or other signs of local infection.  Skin prepped in a sterile fashion.  Local anesthesia: Topical Ethyl chloride.  With sterile technique and under real time ultrasound guidance:  Spinal needle was advanced to the glenohumeral joint taking care to avoid the glenoid labrum, once the joint capsule was entered, a total of 1 cc Kenalog 40, 4 cc lidocaine was injected. Completed without difficulty  Pain immediately resolved suggesting accurate placement of the medication.  Advised to call if fevers/chills, erythema, induration, drainage, or persistent bleeding.  Images permanently stored and available for review in the ultrasound unit.  Impression: Technically successful ultrasound guided injection.  Procedure: Real-time Ultrasound Guided Injection of left subacromial bursa Device: GE Logiq E  Verbal informed consent obtained.  Time-out conducted.  Noted no overlying erythema, induration, or other signs of local infection.  Skin prepped in a sterile fashion.  Local anesthesia: Topical Ethyl chloride.  With sterile technique and under real time ultrasound guidance: Noted partial-thickness tearing of the supraspinatus, there was a distended subacromial bursa, 1 cc Kenalog 40, 3 cc lidocaine was injected easily.  Completed without difficulty  Pain immediately resolved suggesting accurate placement of the medication.  Advised to call if fevers/chills, erythema, induration, drainage, or persistent bleeding.  Images permanently stored and available for review in the ultrasound unit.  Impression: Technically successful ultrasound guided injection.  Impression and Recommendations:   This case required medical decision making of moderate complexity.

## 2013-05-01 ENCOUNTER — Telehealth: Payer: Self-pay | Admitting: *Deleted

## 2013-05-01 MED ORDER — MELOXICAM 15 MG PO TABS: ORAL_TABLET | ORAL | Status: DC

## 2013-05-01 MED ORDER — HYDROCODONE-ACETAMINOPHEN 5-325 MG PO TABS: 1.0000 | ORAL_TABLET | Freq: Three times a day (TID) | ORAL | Status: DC | PRN

## 2013-05-01 NOTE — Telephone Encounter (Signed)
Patient notified can pick rx up at office. Marland Kitchenkg

## 2013-05-01 NOTE — Telephone Encounter (Signed)
Pt calls left message on VM stating that her shoulder really hurts.  When at appointment yesterday she states you didn't think she would need it but she does. Clemetine Marker, LPN

## 2013-05-01 NOTE — Telephone Encounter (Signed)
It will improve quickly, everyone hurts a little more the next day, rx for hydrocodone for brief use in my box.

## 2013-05-13 ENCOUNTER — Ambulatory Visit (INDEPENDENT_AMBULATORY_CARE_PROVIDER_SITE_OTHER): Payer: BC Managed Care – PPO

## 2013-05-13 DIAGNOSIS — M25519 Pain in unspecified shoulder: Secondary | ICD-10-CM

## 2013-05-13 DIAGNOSIS — M25619 Stiffness of unspecified shoulder, not elsewhere classified: Secondary | ICD-10-CM

## 2013-05-13 DIAGNOSIS — R609 Edema, unspecified: Secondary | ICD-10-CM

## 2013-05-13 DIAGNOSIS — M6281 Muscle weakness (generalized): Secondary | ICD-10-CM

## 2013-05-21 ENCOUNTER — Encounter (INDEPENDENT_AMBULATORY_CARE_PROVIDER_SITE_OTHER): Payer: BC Managed Care – PPO | Admitting: Physical Therapy

## 2013-05-21 DIAGNOSIS — M6281 Muscle weakness (generalized): Secondary | ICD-10-CM

## 2013-05-21 DIAGNOSIS — M25619 Stiffness of unspecified shoulder, not elsewhere classified: Secondary | ICD-10-CM

## 2013-05-21 DIAGNOSIS — M25519 Pain in unspecified shoulder: Secondary | ICD-10-CM

## 2013-05-21 DIAGNOSIS — R609 Edema, unspecified: Secondary | ICD-10-CM

## 2013-05-28 ENCOUNTER — Encounter: Payer: Self-pay | Admitting: Sports Medicine

## 2013-05-28 ENCOUNTER — Ambulatory Visit (INDEPENDENT_AMBULATORY_CARE_PROVIDER_SITE_OTHER): Payer: BC Managed Care – PPO | Admitting: Sports Medicine

## 2013-05-28 ENCOUNTER — Encounter (INDEPENDENT_AMBULATORY_CARE_PROVIDER_SITE_OTHER): Payer: BC Managed Care – PPO

## 2013-05-28 VITALS — BP 104/68 | HR 81 | Ht 64.0 in | Wt 156.0 lb

## 2013-05-28 DIAGNOSIS — M25512 Pain in left shoulder: Secondary | ICD-10-CM

## 2013-05-28 DIAGNOSIS — M25519 Pain in unspecified shoulder: Secondary | ICD-10-CM

## 2013-05-28 DIAGNOSIS — M25619 Stiffness of unspecified shoulder, not elsewhere classified: Secondary | ICD-10-CM

## 2013-05-28 DIAGNOSIS — R609 Edema, unspecified: Secondary | ICD-10-CM

## 2013-05-28 DIAGNOSIS — M6281 Muscle weakness (generalized): Secondary | ICD-10-CM

## 2013-05-28 NOTE — Assessment & Plan Note (Addendum)
One month posterior glenohumeral and subacromial injections. Completely pain-free, 3 weeks in the physical therapy, I do recommend finishing a full 4 weeks. She does have what appears to be a proximal biceps tendon tear with a popeye sign but she continues to have good strength to flexion,, a little weak to supination as expected. Nothing needs to be done regarding this. Finish PT, return to see me on an as-needed basis.

## 2013-05-28 NOTE — Progress Notes (Signed)
  Subjective:    CC: Follow up  HPI: Left shoulder pain: Bianca Mckee returns, a month ago I performed the glenohumeral and subacromial injections. Since then she has been through 3 weeks of physical therapy. She is very happy with the results and is pain-free. She has full range of motion. A week ago she did notice a lump in her distal upper arm, but she has not noted any weakness.  Past medical history, Surgical history, Family history not pertinant except as noted below, Social history, Allergies, and medications have been entered into the medical record, reviewed, and no changes needed.   Review of Systems: No fevers, chills, night sweats, weight loss, chest pain, or shortness of breath.   Objective:    General: Well Developed, well nourished, and in no acute distress.  Neuro: Alert and oriented x3, extra-ocular muscles intact, sensation grossly intact.  HEENT: Normocephalic, atraumatic, pupils equal round reactive to light, neck supple, no masses, no lymphadenopathy, thyroid nonpalpable.  Skin: Warm and dry, no rashes. Cardiac: Regular rate and rhythm, no murmurs rubs or gallops, no lower extremity edema.  Respiratory: Clear to auscultation bilaterally. Not using accessory muscles, speaking in full sentences. Left Shoulder: Inspection reveals no abnormalities, atrophy or asymmetry. Palpation is normal with no tenderness over AC joint or bicipital groove. ROM is full in all planes. Rotator cuff strength normal throughout. No signs of impingement with negative Neer and Hawkin's tests, empty can sign. Speeds and Yergason's tests normal. No labral pathology noted with negative Obrien's, negative clunk and good stability. Normal scapular function observed. No painful arc and no drop arm sign. No apprehension sign Only a little bit weak to supination, full flexion strength at the elbow, she does have a popeye deformity suggestive of a proximal biceps tear.  Impression and Recommendations:

## 2013-06-03 ENCOUNTER — Encounter (INDEPENDENT_AMBULATORY_CARE_PROVIDER_SITE_OTHER): Payer: BC Managed Care – PPO

## 2013-06-03 DIAGNOSIS — M25619 Stiffness of unspecified shoulder, not elsewhere classified: Secondary | ICD-10-CM

## 2013-06-03 DIAGNOSIS — R609 Edema, unspecified: Secondary | ICD-10-CM

## 2013-06-03 DIAGNOSIS — M25519 Pain in unspecified shoulder: Secondary | ICD-10-CM

## 2013-06-03 DIAGNOSIS — M6281 Muscle weakness (generalized): Secondary | ICD-10-CM

## 2013-06-10 ENCOUNTER — Encounter (INDEPENDENT_AMBULATORY_CARE_PROVIDER_SITE_OTHER): Payer: BC Managed Care – PPO

## 2013-06-10 ENCOUNTER — Encounter (INDEPENDENT_AMBULATORY_CARE_PROVIDER_SITE_OTHER): Payer: Self-pay

## 2013-06-10 DIAGNOSIS — R609 Edema, unspecified: Secondary | ICD-10-CM

## 2013-06-10 DIAGNOSIS — M6281 Muscle weakness (generalized): Secondary | ICD-10-CM

## 2013-06-10 DIAGNOSIS — M25619 Stiffness of unspecified shoulder, not elsewhere classified: Secondary | ICD-10-CM

## 2013-06-10 DIAGNOSIS — M25519 Pain in unspecified shoulder: Secondary | ICD-10-CM

## 2013-07-14 LAB — LIPID PANEL
Cholesterol: 209 mg/dL — AB (ref 0–200)
HDL: 38 mg/dL (ref 35–70)
LDL Cholesterol: 144 mg/dL

## 2013-07-14 LAB — HEMOGLOBIN A1C: HEMOGLOBIN A1C: 6.5 % — AB (ref 4.0–6.0)

## 2013-09-04 ENCOUNTER — Other Ambulatory Visit: Payer: Self-pay | Admitting: Family Medicine

## 2013-12-08 ENCOUNTER — Encounter: Payer: Self-pay | Admitting: Family Medicine

## 2013-12-08 ENCOUNTER — Ambulatory Visit (INDEPENDENT_AMBULATORY_CARE_PROVIDER_SITE_OTHER): Payer: BC Managed Care – PPO | Admitting: Family Medicine

## 2013-12-08 VITALS — BP 117/66 | HR 83 | Temp 98.1°F | Wt 153.0 lb

## 2013-12-08 DIAGNOSIS — K2100 Gastro-esophageal reflux disease with esophagitis, without bleeding: Secondary | ICD-10-CM

## 2013-12-08 DIAGNOSIS — E8881 Metabolic syndrome: Secondary | ICD-10-CM | POA: Diagnosis not present

## 2013-12-08 DIAGNOSIS — Z0000 Encounter for general adult medical examination without abnormal findings: Secondary | ICD-10-CM

## 2013-12-08 DIAGNOSIS — K5909 Other constipation: Secondary | ICD-10-CM | POA: Insufficient documentation

## 2013-12-08 DIAGNOSIS — Z1211 Encounter for screening for malignant neoplasm of colon: Secondary | ICD-10-CM

## 2013-12-08 DIAGNOSIS — I10 Essential (primary) hypertension: Secondary | ICD-10-CM

## 2013-12-08 DIAGNOSIS — E119 Type 2 diabetes mellitus without complications: Secondary | ICD-10-CM | POA: Insufficient documentation

## 2013-12-08 DIAGNOSIS — K5900 Constipation, unspecified: Secondary | ICD-10-CM

## 2013-12-08 DIAGNOSIS — Z1231 Encounter for screening mammogram for malignant neoplasm of breast: Secondary | ICD-10-CM

## 2013-12-08 DIAGNOSIS — K219 Gastro-esophageal reflux disease without esophagitis: Secondary | ICD-10-CM | POA: Insufficient documentation

## 2013-12-08 LAB — POCT UA - MICROALBUMIN
Albumin/Creatinine Ratio, Urine, POC: <30
CREATININE, POC: 200 mg/dL
Microalbumin Ur, POC: 30 mg/L

## 2013-12-08 LAB — POCT GLYCOSYLATED HEMOGLOBIN (HGB A1C): Hemoglobin A1C: 6.2

## 2013-12-08 MED ORDER — ALPRAZOLAM 1 MG PO TABS: ORAL_TABLET | ORAL | Status: DC

## 2013-12-08 MED ORDER — ESOMEPRAZOLE MAGNESIUM 40 MG PO CPDR: 40.0000 mg | DELAYED_RELEASE_CAPSULE | Freq: Every day | ORAL | Status: DC

## 2013-12-08 MED ORDER — LINACLOTIDE 145 MCG PO CAPS: 145.0000 ug | ORAL_CAPSULE | Freq: Every day | ORAL | Status: DC

## 2013-12-08 NOTE — Assessment & Plan Note (Signed)
Recommend a trial of Linzess. Coupon card given for free 30 day trial. She likes it she can fill the prescription for a $30 co-pay per month.

## 2013-12-08 NOTE — Assessment & Plan Note (Signed)
She would like to restart her Nexium. Works very well for her. She has stopped it recently because of cost. Her co-pay was $50 and she just couldn't afford it. She has been having some more severe reflux symptoms over the last week without the medication. New prescription presented and coupon card provided. Should hopefully make it $15 per month.

## 2013-12-08 NOTE — Progress Notes (Signed)
  Subjective:     Bianca Mckee is a 62 y.o. female and is here for a comprehensive physical exam. The patient reports problems - she would like to lose weight. has lost 5 lbs since last Feb but she i snot exercising.Marland Kitchen  History   Social History  . Marital Status: Divorced    Spouse Name: N/A    Number of Children: N/A  . Years of Education: N/A   Occupational History  . Not on file.   Social History Main Topics  . Smoking status: Never Smoker   . Smokeless tobacco: Not on file  . Alcohol Use: No  . Drug Use: No  . Sexual Activity:    Other Topics Concern  . Not on file   Social History Narrative  . No narrative on file   Health Maintenance  Topic Date Due  . Zostavax  02/03/2013  . Influenza Vaccine  03/18/2014 (Originally 10/17/2013)  . Pap Smear  02/18/2014  . Mammogram  12/03/2014  . Colonoscopy  11/22/2020  . Tetanus/tdap  11/22/2020    The following portions of the patient's history were reviewed and updated as appropriate: allergies, current medications, past family history, past medical history, past social history, past surgical history and problem list.  Review of Systems A comprehensive review of systems was negative.   Objective:    BP 117/66  Pulse 83  Temp(Src) 98.1 F (36.7 C)  Wt 153 lb (69.4 kg) General appearance: alert, cooperative and appears stated age Head: Normocephalic, without obvious abnormality, atraumatic Eyes: conj clear, EOMi , PEERLA Ears: normal TM's and external ear canals both ears Nose: Nares normal. Septum midline. Mucosa normal. No drainage or sinus tenderness. Throat: lips, mucosa, and tongue normal; teeth and gums normal Neck: no adenopathy, no carotid bruit, no JVD, supple, symmetrical, trachea midline and thyroid not enlarged, symmetric, no tenderness/mass/nodules Back: symmetric, no curvature. ROM normal. No CVA tenderness. Lungs: clear to auscultation bilaterally Breasts: normal appearance, no masses or  tenderness Heart: regular rate and rhythm, S1, S2 normal, no murmur, click, rub or gallop Abdomen: soft, non-tender; bowel sounds normal; no masses,  no organomegaly Extremities: extremities normal, atraumatic, no cyanosis or edema Pulses: 2+ and symmetric Skin: Skin color, texture, turgor normal. No rashes or lesions Lymph nodes: Cervical, supraclavicular, and axillary nodes normal. Neurologic: Alert and oriented X 3, normal strength and tone. Normal symmetric reflexes. Normal coordination and gait    Assessment:    Healthy female exam.     Plan:     See After Visit Summary for Counseling Recommendations  Keep up a regular exercise program and make sure you are eating a healthy diet Try to eat 4 servings of dairy a day, or if you are lactose intolerant take a calcium with vitamin D daily.  Your vaccines are up to date.  Declines flu vaccine today. She is finally agreed to a screening colonoscopy but wants to wait until November or December after she has her shoulder surgery. She's never had one before and she 63 years old. She denies any blood in the stool. She does have chronic constipation.

## 2013-12-08 NOTE — Assessment & Plan Note (Addendum)
Well-controlled on current regimen. A1c is 6.2 today. Followup in 3 months. Continue with metformin.  Urine micron been performed today.

## 2013-12-08 NOTE — Assessment & Plan Note (Signed)
Well-controlled.  Continue current regimen. 

## 2013-12-08 NOTE — Patient Instructions (Signed)
Keep up a regular exercise program and make sure you are eating a healthy diet Try to eat 4 servings of dairy a day, or if you are lactose intolerant take a calcium with vitamin D daily.  Your vaccines are up to date.   

## 2013-12-24 ENCOUNTER — Ambulatory Visit (INDEPENDENT_AMBULATORY_CARE_PROVIDER_SITE_OTHER): Payer: BC Managed Care – PPO

## 2013-12-24 DIAGNOSIS — Z1231 Encounter for screening mammogram for malignant neoplasm of breast: Secondary | ICD-10-CM

## 2014-02-04 ENCOUNTER — Other Ambulatory Visit: Payer: Self-pay | Admitting: Family Medicine

## 2014-02-17 ENCOUNTER — Other Ambulatory Visit: Payer: Self-pay | Admitting: Family Medicine

## 2014-02-28 ENCOUNTER — Other Ambulatory Visit: Payer: Self-pay | Admitting: Family Medicine

## 2014-03-09 ENCOUNTER — Ambulatory Visit (INDEPENDENT_AMBULATORY_CARE_PROVIDER_SITE_OTHER): Payer: BC Managed Care – PPO | Admitting: Family Medicine

## 2014-03-09 ENCOUNTER — Encounter: Payer: Self-pay | Admitting: Family Medicine

## 2014-03-09 VITALS — BP 127/78 | HR 72 | Wt 156.0 lb

## 2014-03-09 DIAGNOSIS — E119 Type 2 diabetes mellitus without complications: Secondary | ICD-10-CM

## 2014-03-09 DIAGNOSIS — F331 Major depressive disorder, recurrent, moderate: Secondary | ICD-10-CM | POA: Diagnosis not present

## 2014-03-09 DIAGNOSIS — Z23 Encounter for immunization: Secondary | ICD-10-CM | POA: Diagnosis not present

## 2014-03-09 LAB — POCT GLYCOSYLATED HEMOGLOBIN (HGB A1C): Hemoglobin A1C: 6.3

## 2014-03-09 NOTE — Progress Notes (Signed)
   Subjective:    Patient ID: Bianca Mckee, female    DOB: 1950-05-25, 63 y.o.   MRN: [id number]  HPI Diabetes - no hypoglycemic events. No wounds or sores that are not healing well. No increased thirst or urination. Checking glucose at home. Taking medications as prescribed without any side effects.  Had left shoulder surgery in October. She is in PT. she still has somewhat limited range of motion of that left shoulder. Is going back to work in about 2 weeks.   Depression-says she has had little interest or pleasure in doing things more than half the days and has felt down several days a week. She does have complaint of fatigue and sleep issues. She's also not been as active over the last month and a half with her recent shoulder surgery. She'll start back to work in about 2 weeks.  Review of Systems     Objective:   Physical Exam  Constitutional: She is oriented to person, place, and time. She appears well-developed and well-nourished.  HENT:  Head: Normocephalic and atraumatic.  Cardiovascular: Normal rate, regular rhythm and normal heart sounds.   Pulmonary/Chest: Effort normal and breath sounds normal.  Neurological: She is alert and oriented to person, place, and time.  Skin: Skin is warm and dry.  Psychiatric: She has a normal mood and affect. Her behavior is normal.          Assessment & Plan:  DM- Well controlled. Foot exam performed.  Last eye exam was in 2012. Strongly encouraged her to try to get in and make an appointment for an eye exam. I discussed the importance of this especially with having a diagnosis of diabetes. Continue current regimen. Follow-up in 3 months. Pneumococcal vaccine discussed today.  Depression - PHQ- 9 score of 7. Under fair control on current regimen. Will continue current regimen. Follow-up in 6 months.   Pneumococcal 23 given today.

## 2014-04-01 ENCOUNTER — Other Ambulatory Visit: Payer: Self-pay | Admitting: *Deleted

## 2014-04-01 MED ORDER — CITALOPRAM HYDROBROMIDE 40 MG PO TABS: 40.0000 mg | ORAL_TABLET | Freq: Every day | ORAL | Status: DC

## 2014-04-29 ENCOUNTER — Other Ambulatory Visit: Payer: Self-pay | Admitting: *Deleted

## 2014-04-29 MED ORDER — ALPRAZOLAM 1 MG PO TABS: ORAL_TABLET | ORAL | Status: DC

## 2014-05-03 ENCOUNTER — Other Ambulatory Visit: Payer: Self-pay | Admitting: *Deleted

## 2014-05-03 MED ORDER — METFORMIN HCL 500 MG PO TABS: 500.0000 mg | ORAL_TABLET | Freq: Two times a day (BID) | ORAL | Status: DC

## 2014-06-01 ENCOUNTER — Encounter: Payer: Self-pay | Admitting: Family Medicine

## 2014-06-08 ENCOUNTER — Ambulatory Visit: Payer: BC Managed Care – PPO | Admitting: Family Medicine

## 2014-06-10 ENCOUNTER — Ambulatory Visit (INDEPENDENT_AMBULATORY_CARE_PROVIDER_SITE_OTHER): Payer: BLUE CROSS/BLUE SHIELD | Admitting: Family Medicine

## 2014-06-10 ENCOUNTER — Encounter: Payer: Self-pay | Admitting: Family Medicine

## 2014-06-10 VITALS — BP 106/56 | HR 96 | Ht 64.0 in | Wt 156.0 lb

## 2014-06-10 DIAGNOSIS — Z114 Encounter for screening for human immunodeficiency virus [HIV]: Secondary | ICD-10-CM | POA: Diagnosis not present

## 2014-06-10 DIAGNOSIS — I10 Essential (primary) hypertension: Secondary | ICD-10-CM | POA: Diagnosis not present

## 2014-06-10 DIAGNOSIS — Z1159 Encounter for screening for other viral diseases: Secondary | ICD-10-CM | POA: Diagnosis not present

## 2014-06-10 DIAGNOSIS — E119 Type 2 diabetes mellitus without complications: Secondary | ICD-10-CM

## 2014-06-10 DIAGNOSIS — F331 Major depressive disorder, recurrent, moderate: Secondary | ICD-10-CM

## 2014-06-10 MED ORDER — METFORMIN HCL 1000 MG PO TABS: 1000.0000 mg | ORAL_TABLET | Freq: Two times a day (BID) | ORAL | Status: DC

## 2014-06-10 NOTE — Progress Notes (Signed)
   Subjective:    Patient ID: Bianca Mckee, female    DOB: 1950-12-12, 64 y.o.   MRN: [id number]  HPI Diabetes - no hypoglycemic events. No wounds or sores that are not healing well. No increased thirst or urination. Checking glucose at home. Taking medications as prescribed without any side effects.  Hypertension- Pt denies chest pain, SOB, dizziness, or heart palpitations.  Taking meds as directed w/o problems.  Denies medication side effects.    Still having problems with her left shoulder.  She has been in thearpy since October and still having problems. She is scheduled for another MRI.    She has been under a lot of stress lately. She says work has been very stressful. She doesn't like her supervisor at work and doesn't get time off of work. In fact she says she's been using her alprazolam more frequently.  Review of Systems      Objective:   Physical Exam  Constitutional: She is oriented to person, place, and time. She appears well-developed and well-nourished.  HENT:  Head: Normocephalic and atraumatic.  Cardiovascular: Normal rate, regular rhythm and normal heart sounds.   Pulmonary/Chest: Effort normal and breath sounds normal.  Neurological: She is alert and oriented to person, place, and time.  Skin: Skin is warm and dry.  Psychiatric: She has a normal mood and affect. Her behavior is normal.          Assessment & Plan:  Diabetes-well-controlled. Hemoglobin A1c of 6.7 today. She is due for an eye exam. Pneumonia vaccine given. Has eye exam in FEb ( [facility] [address]) . Will call to get report. Continue work on diet and exercise. Follow-up in 3 months.  HTN - well controlled on current regimen.  She is due for blood work primarily a BMP. But she says she section following up with her nephrologist next month and that he typically does blood work.   Left shoulder pain - Has repeat MRI scheduled with ortho.   Depression - reminded her to use the  xanax sparingly and reminede her of potential for dependency.

## 2014-07-15 ENCOUNTER — Other Ambulatory Visit: Payer: Self-pay | Admitting: Family Medicine

## 2014-08-31 ENCOUNTER — Ambulatory Visit: Payer: BLUE CROSS/BLUE SHIELD | Admitting: Family Medicine

## 2014-09-09 ENCOUNTER — Ambulatory Visit: Payer: BLUE CROSS/BLUE SHIELD | Admitting: Family Medicine

## 2014-09-21 ENCOUNTER — Encounter: Payer: Self-pay | Admitting: Family Medicine

## 2014-09-21 ENCOUNTER — Ambulatory Visit (INDEPENDENT_AMBULATORY_CARE_PROVIDER_SITE_OTHER): Payer: BLUE CROSS/BLUE SHIELD | Admitting: Family Medicine

## 2014-09-21 VITALS — BP 120/77 | HR 77 | Wt 156.0 lb

## 2014-09-21 DIAGNOSIS — N189 Chronic kidney disease, unspecified: Secondary | ICD-10-CM

## 2014-09-21 DIAGNOSIS — I10 Essential (primary) hypertension: Secondary | ICD-10-CM | POA: Diagnosis not present

## 2014-09-21 DIAGNOSIS — E119 Type 2 diabetes mellitus without complications: Secondary | ICD-10-CM

## 2014-09-21 LAB — POCT GLYCOSYLATED HEMOGLOBIN (HGB A1C): Hemoglobin A1C: 6.5

## 2014-09-21 MED ORDER — AMBULATORY NON FORMULARY MEDICATION: Status: DC

## 2014-09-21 NOTE — Progress Notes (Signed)
   Subjective:    Patient ID: Bianca Mckee, female    DOB: 1950-05-12, 64 y.o.   MRN: [id number]  HPI Diabetes - no hypoglycemic events. No wounds or sores that are not healing well. No increased thirst or urination. Checking glucose at home. Taking medications as prescribed without any side effects.  Last eye exam was April ( [facility] on [address]).   Hypertension- Pt denies chest pain, SOB, dizziness, or heart palpitations.  Taking meds as directed w/o problems.  Denies medication side effects. Hasn't been able to exercise bc of her right knee pain.    Chronic kidney disease-follows with Dr. Welton Mckee.we don't have up to date labs.  Will call to get the last note.   Review of Systems     Objective:   Physical Exam  Constitutional: She is oriented to person, place, and time. She appears well-developed and well-nourished.  HENT:  Head: Normocephalic and atraumatic.  Cardiovascular: Normal rate, regular rhythm and normal heart sounds.   Pulmonary/Chest: Effort normal and breath sounds normal.  Neurological: She is alert and oriented to person, place, and time.  Skin: Skin is warm and dry.  Psychiatric: She has a normal mood and affect. Her behavior is normal.        Assessment & Plan:  DM- a1c is 6.5. Well controlled, though up from last time.  Continue current regimen. Follow-up in 3-4 months. Due for CMP and fasting lipid panel. She was supposed to go for bloodwork in March and did not. She has had a cholesterol done through work.  Had kidney function done through Dr. Welton Mckee.   Hypertension-well-controlled. Continue current regimen. She will also try to get [place] a copy of her recent cholesterol that was done through work. I gave her our fax number.   CKD - follows with Dr. Welton Mckee. Due to recheck kidney function. Will call to get labs from this spring.  To better manage her diabetes I need to know what's going on with her kidney function. I think she may be staged as  CKD 2 or possibly CKD 3 but I need the notes to confirm.

## 2014-09-21 NOTE — Progress Notes (Signed)
Called Dr. Vivi Barrack office [phone]) lvm asking for rtn call with regards to pt's labs.Bianca Mckee [place] [facility] [place]

## 2014-10-04 DIAGNOSIS — G8929 Other chronic pain: Secondary | ICD-10-CM | POA: Insufficient documentation

## 2014-10-04 DIAGNOSIS — M25561 Pain in right knee: Secondary | ICD-10-CM

## 2014-10-13 ENCOUNTER — Other Ambulatory Visit: Payer: Self-pay | Admitting: Family Medicine

## 2014-10-26 ENCOUNTER — Other Ambulatory Visit: Payer: Self-pay | Admitting: Family Medicine

## 2014-12-15 ENCOUNTER — Other Ambulatory Visit: Payer: Self-pay | Admitting: Family Medicine

## 2015-01-06 ENCOUNTER — Ambulatory Visit (INDEPENDENT_AMBULATORY_CARE_PROVIDER_SITE_OTHER): Payer: BLUE CROSS/BLUE SHIELD | Admitting: Family Medicine

## 2015-01-06 ENCOUNTER — Encounter: Payer: Self-pay | Admitting: Family Medicine

## 2015-01-06 VITALS — BP 111/79 | HR 85 | Temp 98.6°F | Resp 18 | Ht 64.0 in | Wt 152.2 lb

## 2015-01-06 DIAGNOSIS — Z1211 Encounter for screening for malignant neoplasm of colon: Secondary | ICD-10-CM | POA: Diagnosis not present

## 2015-01-06 DIAGNOSIS — Z23 Encounter for immunization: Secondary | ICD-10-CM

## 2015-01-06 DIAGNOSIS — Z0000 Encounter for general adult medical examination without abnormal findings: Secondary | ICD-10-CM

## 2015-01-06 DIAGNOSIS — E119 Type 2 diabetes mellitus without complications: Secondary | ICD-10-CM

## 2015-01-06 LAB — POCT GLYCOSYLATED HEMOGLOBIN (HGB A1C): Hemoglobin A1C: 6.3

## 2015-01-06 NOTE — Progress Notes (Signed)
Subjective:     Bianca Mckee is a 64 y.o. female and is here for a comprehensive physical exam. The patient reports problems - not eating the best.  she has been working out baout 2-3 days per week.  Social History   Social History  . Marital Status: Divorced    Spouse Name: N/A  . Number of Children: N/A  . Years of Education: N/A   Occupational History  . Not on file.   Social History Main Topics  . Smoking status: Never Smoker   . Smokeless tobacco: Not on file  . Alcohol Use: No  . Drug Use: No  . Sexual Activity: Not on file   Other Topics Concern  . Not on file   Social History Narrative   Health Maintenance  Topic Date Due  . Hepatitis C Screening  01[birth year]  . PNEUMOCOCCAL POLYSACCHARIDE VACCINE (1) 02/04/1955  . OPHTHALMOLOGY EXAM  02/04/1963  . HIV Screening  02/04/1968  . ZOSTAVAX  02/03/2013  . PAP SMEAR  02/18/2014  . INFLUENZA VACCINE  10/18/2014  . URINE MICROALBUMIN  12/09/2014  . FOOT EXAM  03/10/2015  . HEMOGLOBIN A1C  03/24/2015  . MAMMOGRAM  12/25/2015  . COLONOSCOPY  11/22/2020  . TETANUS/TDAP  11/22/2020    The following portions of the patient's history were reviewed and updated as appropriate: allergies, current medications, past family history, past medical history, past social history, past surgical history and problem list.  Review of Systems Pertinent items noted in HPI and remainder of comprehensive ROS otherwise negative.   Objective:    BP 111/79 mmHg  Pulse 85  Temp(Src) 98.6 F (37 C) (Oral)  Resp 18  Ht 5\' 4"  (1.626 m)  Wt 152 lb 3.2 oz (69.037 kg)  BMI 26.11 kg/m2  SpO2 98% General appearance: alert, cooperative and appears stated age Head: Normocephalic, without obvious abnormality, atraumatic Eyes: conj clear. EOMI, PEERLA Ears: normal TM's and external ear canals both ears Nose: Nares normal. Septum midline. Mucosa normal. No drainage or sinus tenderness. Throat: lips, mucosa, and tongue normal; teeth and  gums normal Neck: no adenopathy, no carotid bruit, no JVD, supple, symmetrical, trachea midline and thyroid not enlarged, symmetric, no tenderness/mass/nodules Back: symmetric, no curvature. ROM normal. No CVA tenderness. Lungs: clear to auscultation bilaterally Heart: regular rate and rhythm, S1, S2 normal, no murmur, click, rub or gallop Abdomen: soft, non-tender; bowel sounds normal; no masses,  no organomegaly Extremities: extremities normal, atraumatic, no cyanosis or edema Pulses: 2+ and symmetric Skin: Skin color, texture, turgor normal. No rashes or lesions Lymph nodes: Cervical, supraclavicular, and axillary nodes normal. Neurologic: Alert and oriented X 3, normal strength and tone. Normal symmetric reflexes. Normal coordination and gait    Assessment:    Healthy female exam.      Plan:     See After Visit Summary for Counseling Recommendations   Keep up a regular exercise program and make sure you are eating a healthy diet Try to eat 4 servings of dairy a day, or if you are lactose intolerant take a calcium with vitamin D daily.  Your vaccines are up to date.   DM-   Well-controlled. Hemoglobin A1c of 6.3 in April. She workup if her blood work from work.   pneumonia vaccine given today. Shingles vaccine given today.   due for mammogram. She will schedule downstairs.  She plans to schedule her to plan exam with her OB/GYN. She will try to do that this fall.  Declines colon  cancer screening with colonoscopy but is okay with doing Cologuard.

## 2015-01-06 NOTE — Patient Instructions (Signed)
Keep up a regular exercise program and make sure you are eating a healthy diet Try to eat 4 servings of dairy a day, or if you are lactose intolerant take a calcium with vitamin D daily.  Your vaccines are up to date.   

## 2015-01-07 LAB — COMPLETE METABOLIC PANEL WITH GFR
ALT: 12 U/L (ref 6–29)
AST: 22 U/L (ref 10–35)
Albumin: 4.2 g/dL (ref 3.6–5.1)
Alkaline Phosphatase: 78 U/L (ref 33–130)
BILIRUBIN TOTAL: 0.3 mg/dL (ref 0.2–1.2)
BUN: 20 mg/dL (ref 7–25)
CO2: 23 mmol/L (ref 20–31)
CREATININE: 1.02 mg/dL — AB (ref 0.50–0.99)
Calcium: 9.4 mg/dL (ref 8.6–10.4)
Chloride: 106 mmol/L (ref 98–110)
GFR, Est African American: 69 mL/min (ref >=60)
GFR, Est Non African American: 60 mL/min (ref >=60)
GLUCOSE: 102 mg/dL — AB (ref 65–99)
Potassium: 4.2 mmol/L (ref 3.5–5.3)
SODIUM: 139 mmol/L (ref 135–146)
TOTAL PROTEIN: 6.7 g/dL (ref 6.1–8.1)

## 2015-01-12 ENCOUNTER — Other Ambulatory Visit: Payer: Self-pay

## 2015-01-12 ENCOUNTER — Ambulatory Visit (INDEPENDENT_AMBULATORY_CARE_PROVIDER_SITE_OTHER): Payer: BLUE CROSS/BLUE SHIELD

## 2015-01-12 DIAGNOSIS — Z1231 Encounter for screening mammogram for malignant neoplasm of breast: Secondary | ICD-10-CM

## 2015-01-12 DIAGNOSIS — Z1211 Encounter for screening for malignant neoplasm of colon: Secondary | ICD-10-CM

## 2015-01-12 MED ORDER — AMBULATORY NON FORMULARY MEDICATION: Status: DC

## 2015-02-08 ENCOUNTER — Encounter: Payer: Self-pay | Admitting: Family Medicine

## 2015-02-22 ENCOUNTER — Other Ambulatory Visit: Payer: Self-pay | Admitting: Family Medicine

## 2015-03-02 DIAGNOSIS — M1711 Unilateral primary osteoarthritis, right knee: Secondary | ICD-10-CM | POA: Insufficient documentation

## 2015-03-04 LAB — BASIC METABOLIC PANEL
BUN: 28 mg/dL — AB (ref 4–21)
Creatinine: 1.1 mg/dL (ref 0.5–1.1)
GLUCOSE: 133 mg/dL
POTASSIUM: 4.9 mmol/L (ref 3.4–5.3)
Sodium: 134 mmol/L — AB (ref 137–147)

## 2015-03-04 LAB — BASIC METABOLIC PANEL WITH GFR
CO2: 27 mmol/L
EGFR (African American): 62

## 2015-04-08 ENCOUNTER — Ambulatory Visit: Payer: BLUE CROSS/BLUE SHIELD | Admitting: Family Medicine

## 2015-04-13 ENCOUNTER — Other Ambulatory Visit: Payer: Self-pay | Admitting: Family Medicine

## 2015-05-24 ENCOUNTER — Other Ambulatory Visit: Payer: Self-pay | Admitting: Family Medicine

## 2015-05-26 ENCOUNTER — Other Ambulatory Visit: Payer: Self-pay | Admitting: Family Medicine

## 2015-05-26 NOTE — Telephone Encounter (Signed)
Needs f/u appt. No future refills. Last f/u for depression 3-16

## 2015-06-07 ENCOUNTER — Ambulatory Visit: Payer: BLUE CROSS/BLUE SHIELD | Admitting: Family Medicine

## 2015-06-14 ENCOUNTER — Ambulatory Visit (INDEPENDENT_AMBULATORY_CARE_PROVIDER_SITE_OTHER): Payer: BLUE CROSS/BLUE SHIELD | Admitting: Family Medicine

## 2015-06-14 ENCOUNTER — Encounter: Payer: Self-pay | Admitting: Family Medicine

## 2015-06-14 VITALS — BP 102/69 | HR 89 | Wt 143.0 lb

## 2015-06-14 DIAGNOSIS — E119 Type 2 diabetes mellitus without complications: Secondary | ICD-10-CM | POA: Diagnosis not present

## 2015-06-14 DIAGNOSIS — I10 Essential (primary) hypertension: Secondary | ICD-10-CM

## 2015-06-14 DIAGNOSIS — F330 Major depressive disorder, recurrent, mild: Secondary | ICD-10-CM

## 2015-06-14 DIAGNOSIS — Z23 Encounter for immunization: Secondary | ICD-10-CM | POA: Diagnosis not present

## 2015-06-14 DIAGNOSIS — I82401 Acute embolism and thrombosis of unspecified deep veins of right lower extremity: Secondary | ICD-10-CM | POA: Diagnosis not present

## 2015-06-14 LAB — POCT GLYCOSYLATED HEMOGLOBIN (HGB A1C): Hemoglobin A1C: 5.8

## 2015-06-14 MED ORDER — METFORMIN HCL 1000 MG PO TABS: 1000.0000 mg | ORAL_TABLET | Freq: Two times a day (BID) | ORAL | Status: DC

## 2015-06-14 MED ORDER — CITALOPRAM HYDROBROMIDE 40 MG PO TABS: 40.0000 mg | ORAL_TABLET | Freq: Every day | ORAL | Status: DC

## 2015-06-14 NOTE — Addendum Note (Signed)
Addended by: Teddy Spike on: 06/14/2015 03:55 PM   Modules accepted: Orders

## 2015-06-14 NOTE — Progress Notes (Signed)
   Subjective:    Patient ID: Bianca Mckee, female    DOB: December 08, 1950, 65 y.o.   MRN: [id number]  HPI Diabetes - no hypoglycemic events. No wounds or sores that are not healing well. No increased thirst or urination. Checking glucose at home. Taking medications as prescribed without any side effects.Her weight is actually down about 9 pounds.  Hypertension- Pt denies chest pain, SOB, dizziness, or heart palpitations.  Taking meds as directed w/o problems.  Denies medication side effects.    She did have her knee replacement in December. Unfortunately she ended up getting a blood clot and then that causes only in her therapy she's had a major problem healing. She finally back at work and doing a little bit better though she still in maintenance physical therapy.  Follow-up depression-overall she is doing well with citalopram. She's had a lot of struggles with her knee but is starting to feel like she's getting back to herself. She does still have difficulty with sleep. She reports that she uses her alprazolam sparingly. They lately she has been using it at night to help her relax because of her knee pain.  Review of Systems     Objective:   Physical Exam  Constitutional: She is oriented to person, place, and time. She appears well-developed and well-nourished.  HENT:  Head: Normocephalic and atraumatic.  Cardiovascular: Normal rate, regular rhythm and normal heart sounds.   Pulmonary/Chest: Effort normal and breath sounds normal.  Neurological: She is alert and oriented to person, place, and time.  Skin: Skin is warm and dry.  Psychiatric: She has a normal mood and affect. Her behavior is normal.          Assessment & Plan:  DM-Well controlled. Continue current regimen. Eye exam is scheduled for next month. Please have them send [place] a copy. She is due for a lipid panel. She says she will get blood work done in May at work and says she will fax [place] a copy of that. Lab Results   Component Value Date   HGBA1C 5.8 06/14/2015   HTN - Well controlled. Continue current regimen. Dr. Welton Flakes her cardiologist about a month ago and they did blood work.  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 She will be on Xarelto for 1 more month.  Depression-PHQ 9 score of 6 today. She rates her symptoms is somewhat difficult. She is happy with her regimen with the citalopram. New prescription sent to the pharmacy.  Tdap given today.

## 2015-06-15 ENCOUNTER — Encounter: Payer: Self-pay | Admitting: Family Medicine

## 2015-06-28 LAB — HM DIABETES EYE EXAM

## 2015-07-05 ENCOUNTER — Encounter: Payer: Self-pay | Admitting: Family Medicine

## 2015-07-05 MED ORDER — TRAZODONE HCL 50 MG PO TABS: 25.0000 mg | ORAL_TABLET | Freq: Every evening | ORAL | Status: DC | PRN

## 2015-07-05 NOTE — Telephone Encounter (Signed)
Script sent for trazodone for insomnia.

## 2015-07-13 ENCOUNTER — Other Ambulatory Visit: Payer: Self-pay | Admitting: Family Medicine

## 2015-07-18 ENCOUNTER — Encounter: Payer: Self-pay | Admitting: Family Medicine

## 2015-07-27 LAB — LIPID PANEL
Cholesterol: 268 mg/dL — AB (ref 0–200)
HDL: 36 mg/dL (ref 35–70)
LDL Cholesterol: 180 mg/dL

## 2015-07-27 LAB — HEMOGLOBIN A1C: HEMOGLOBIN A1C: 6

## 2015-08-04 ENCOUNTER — Other Ambulatory Visit: Payer: Self-pay | Admitting: *Deleted

## 2015-08-04 MED ORDER — CITALOPRAM HYDROBROMIDE 40 MG PO TABS: ORAL_TABLET | ORAL | Status: DC

## 2015-08-10 ENCOUNTER — Other Ambulatory Visit: Payer: Self-pay | Admitting: Family Medicine

## 2015-09-04 ENCOUNTER — Other Ambulatory Visit: Payer: Self-pay | Admitting: Family Medicine

## 2015-10-04 ENCOUNTER — Telehealth: Payer: Self-pay | Admitting: Family Medicine

## 2015-10-04 ENCOUNTER — Encounter: Payer: Self-pay | Admitting: Family Medicine

## 2015-10-04 ENCOUNTER — Ambulatory Visit (INDEPENDENT_AMBULATORY_CARE_PROVIDER_SITE_OTHER): Payer: BLUE CROSS/BLUE SHIELD | Admitting: Family Medicine

## 2015-10-04 VITALS — BP 106/58 | HR 80 | Wt 142.0 lb

## 2015-10-04 DIAGNOSIS — N183 Chronic kidney disease, stage 3 (moderate): Secondary | ICD-10-CM | POA: Diagnosis not present

## 2015-10-04 DIAGNOSIS — K5900 Constipation, unspecified: Secondary | ICD-10-CM | POA: Diagnosis not present

## 2015-10-04 DIAGNOSIS — K5909 Other constipation: Secondary | ICD-10-CM

## 2015-10-04 DIAGNOSIS — E119 Type 2 diabetes mellitus without complications: Secondary | ICD-10-CM | POA: Diagnosis not present

## 2015-10-04 DIAGNOSIS — E785 Hyperlipidemia, unspecified: Secondary | ICD-10-CM

## 2015-10-04 DIAGNOSIS — I10 Essential (primary) hypertension: Secondary | ICD-10-CM

## 2015-10-04 MED ORDER — LINACLOTIDE 145 MCG PO CAPS
145.0000 ug | ORAL_CAPSULE | Freq: Every day | ORAL | Status: DC
Start: 2015-10-04 — End: 2015-11-23

## 2015-10-04 MED ORDER — ATORVASTATIN CALCIUM 20 MG PO TABS: 20.0000 mg | ORAL_TABLET | Freq: Every day | ORAL | Status: DC

## 2015-10-04 NOTE — Progress Notes (Signed)
Subjective:    CC: DM  HPI: Diabetes - no hypoglycemic events. No wounds or sores that are not healing well. No increased thirst or urination. Checking glucose at home. Taking medications as prescribed without any side effects.  Hypertension- Pt denies chest pain, SOB, dizziness, or heart palpitations.  Taking meds as directed w/o problems.  Denies medication side effects.    CKD 3- She brought in a copy of her lab work that she had done through her work. No change in urinary symptoms. Lab Results  Component Value Date   CREATININE 1.1 03/04/2015     Hyperlipidemia - Her LDL was very elevated on recent lab done through her work. It has been trending up over the last several years. She says she actually has been exercising more than ever and feels like she is eating health.   Past medical history, Surgical history, Family history not pertinant except as noted below, Social history, Allergies, and medications have been entered into the medical record, reviewed, and corrections made.   Review of Systems: No fevers, chills, night sweats, weight loss, chest pain, or shortness of breath.   Objective:    General: Well Developed, well nourished, and in no acute distress.  Neuro: Alert and oriented x3, extra-ocular muscles intact, sensation grossly intact.  HEENT: Normocephalic, atraumatic  Skin: Warm and dry, no rashes. Cardiac: Regular rate and rhythm, no murmurs rubs or gallops, no lower extremity edema.  Respiratory: Clear to auscultation bilaterally. Not using accessory muscles, speaking in full sentences.   Impression and Recommendations:   DM- last A1C is 6.0, Continue current regimen for now will adjust if we need to based on results. Continue metformin.  CKD 3 - due to recheck kidney function.  Hyperlipidemia - will start lipitor. Discussed potential side effects. Recheck labs in 3 months.    Hypertension-well-controlled. In fact blood pressure is little bit on the lower and it  has been the last couple times she's been here. 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

## 2015-10-04 NOTE — Telephone Encounter (Signed)
Please call patient: Her blood pressure was a little on the lower and during her office visit and the visit before. 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

## 2015-10-04 NOTE — Patient Instructions (Signed)
Plan to recheck cholesterol in 3 months.

## 2015-10-05 NOTE — Telephone Encounter (Signed)
Called pt and lvm w/recommendations. Told to rtn call if any questions.Bianca Mckee

## 2015-10-14 ENCOUNTER — Ambulatory Visit: Payer: BLUE CROSS/BLUE SHIELD | Admitting: Family Medicine

## 2015-10-21 ENCOUNTER — Other Ambulatory Visit: Payer: Self-pay | Admitting: Family Medicine

## 2015-11-03 ENCOUNTER — Encounter: Payer: Self-pay | Admitting: Family Medicine

## 2015-11-21 ENCOUNTER — Other Ambulatory Visit: Payer: Self-pay | Admitting: Family Medicine

## 2015-11-23 ENCOUNTER — Other Ambulatory Visit: Payer: Self-pay | Admitting: *Deleted

## 2015-11-23 MED ORDER — LINACLOTIDE 145 MCG PO CAPS: 145.0000 ug | ORAL_CAPSULE | Freq: Every day | ORAL | 3 refills | Status: DC

## 2015-12-02 ENCOUNTER — Ambulatory Visit (INDEPENDENT_AMBULATORY_CARE_PROVIDER_SITE_OTHER): Payer: BLUE CROSS/BLUE SHIELD | Admitting: Sports Medicine

## 2015-12-02 ENCOUNTER — Encounter: Payer: Self-pay | Admitting: Sports Medicine

## 2015-12-02 DIAGNOSIS — L249 Irritant contact dermatitis, unspecified cause: Secondary | ICD-10-CM | POA: Diagnosis not present

## 2015-12-02 MED ORDER — TRIAMCINOLONE ACETONIDE 0.5 % EX CREA: 1.0000 "application " | TOPICAL_CREAM | Freq: Two times a day (BID) | CUTANEOUS | 3 refills | Status: DC

## 2015-12-02 NOTE — Progress Notes (Signed)
  Subjective:    CC: Skin rash  HPI: For the past month this pleasant 65 year old female has had a red rash that's minimally itchy that she localizes around the base of her neck, and somewhat between her intermammary cleft. She has been wearing various necklaces, but has stopped in the past week. Symptoms are moderate, persistent.  Past medical history, Surgical history, Family history not pertinant except as noted below, Social history, Allergies, and medications have been entered into the medical record, reviewed, and no changes needed.   Review of Systems: No fevers, chills, night sweats, weight loss, chest pain, or shortness of breath.   Objective:    General: Well Developed, well nourished, and in no acute distress.  Neuro: Alert and oriented x3, extra-ocular muscles intact, sensation grossly intact.  HEENT: Normocephalic, atraumatic, pupils equal round reactive to light, neck supple, no masses, no lymphadenopathy, thyroid nonpalpable.  Skin: Warm and dry, There is an erythematous, somewhat flattened rash around the neck, the skin is thin, there is a similar rash at a small spot in the intermammary cleft. No signs of bacterial superinfection. Cardiac: Regular rate and rhythm, no murmurs rubs or gallops, no lower extremity edema.  Respiratory: Clear to auscultation bilaterally. Not using accessory muscles, speaking in full sentences.  Impression and Recommendations:    Irritant dermatitis I suspect that the necklaces that she's been wearing have some degree of nickel alloy. Adding topical triamcinolone to be applied twice a day to affected areas. Avoid necklaces for now, she may use Gold or Pearl or cloth. Return to see me in one month.  I spent 25 minutes with this patient, greater than 50% was face-to-face time counseling regarding the above diagnoses

## 2015-12-02 NOTE — Assessment & Plan Note (Addendum)
I suspect that the necklaces that she's been wearing have some degree of nickel alloy. Adding topical triamcinolone to be applied twice a day to affected areas. Avoid necklaces for now, she may use Gold or Pearl or cloth. Return to see me in one month.

## 2015-12-20 LAB — HM COLONOSCOPY

## 2015-12-23 ENCOUNTER — Encounter: Payer: Self-pay | Admitting: Family Medicine

## 2016-01-05 ENCOUNTER — Other Ambulatory Visit: Payer: Self-pay | Admitting: Family Medicine

## 2016-01-05 ENCOUNTER — Ambulatory Visit (INDEPENDENT_AMBULATORY_CARE_PROVIDER_SITE_OTHER): Payer: BLUE CROSS/BLUE SHIELD | Admitting: Family Medicine

## 2016-01-05 ENCOUNTER — Encounter: Payer: Self-pay | Admitting: Family Medicine

## 2016-01-05 VITALS — BP 100/61 | HR 65 | Wt 145.0 lb

## 2016-01-05 DIAGNOSIS — R21 Rash and other nonspecific skin eruption: Secondary | ICD-10-CM

## 2016-01-05 DIAGNOSIS — N183 Chronic kidney disease, stage 3 unspecified: Secondary | ICD-10-CM

## 2016-01-05 DIAGNOSIS — Z1159 Encounter for screening for other viral diseases: Secondary | ICD-10-CM | POA: Diagnosis not present

## 2016-01-05 DIAGNOSIS — Z114 Encounter for screening for human immunodeficiency virus [HIV]: Secondary | ICD-10-CM | POA: Diagnosis not present

## 2016-01-05 DIAGNOSIS — Z0000 Encounter for general adult medical examination without abnormal findings: Secondary | ICD-10-CM | POA: Diagnosis not present

## 2016-01-05 DIAGNOSIS — I10 Essential (primary) hypertension: Secondary | ICD-10-CM

## 2016-01-05 DIAGNOSIS — E119 Type 2 diabetes mellitus without complications: Secondary | ICD-10-CM

## 2016-01-05 DIAGNOSIS — E785 Hyperlipidemia, unspecified: Secondary | ICD-10-CM

## 2016-01-05 MED ORDER — ALPRAZOLAM 1 MG PO TABS: ORAL_TABLET | ORAL | 1 refills | Status: DC

## 2016-01-05 NOTE — Patient Instructions (Signed)
Keep up a regular exercise program and make sure you are eating a healthy diet Try to eat 4 servings of dairy a day, or if you are lactose intolerant take a calcium with vitamin D daily.  Your vaccines are up to date.   

## 2016-01-05 NOTE — Progress Notes (Signed)
Subjective:     Bianca Mckee is a 65 y.o. female and is here for a comprehensive physical exam. The patient reports problems - still has rash on antecubital fossa on both arms.  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 she has been usign a topical steroid and says it is getting better.   Social History   Social History  . Marital status: Divorced    Spouse name: N/A  . Number of children: N/A  . Years of education: N/A   Occupational History  . Not on file.   Social History Main Topics  . Smoking status: Never Smoker  . Smokeless tobacco: Not on file  . Alcohol use No  . Drug use: No  . Sexual activity: Not on file   Other Topics Concern  . Not on file   Social History Narrative  . No narrative on file   Health Maintenance  Topic Date Due  . Hepatitis C Screening  Jul 03, 1950  . HIV Screening  02/04/1968  . INFLUENZA VACCINE  06/16/2017 (Originally 10/18/2015)  . HEMOGLOBIN A1C  01/27/2016  . FOOT EXAM  06/13/2016  . OPHTHALMOLOGY EXAM  06/27/2016  . MAMMOGRAM  01/11/2017  . PAP SMEAR  01/31/2018  . PNEUMOCOCCAL POLYSACCHARIDE VACCINE (2) 01/06/2020  . COLONOSCOPY  12/19/2020  . TETANUS/TDAP  06/13/2025  . ZOSTAVAX  Completed    The following portions of the patient's history were reviewed and updated as appropriate: allergies, current medications, past family history, past medical history, past social history, past surgical history and problem list.  Review of Systems A comprehensive review of systems was negative.   Objective:    BP 100/61   Pulse 65   Wt 145 lb (65.8 kg)   SpO2 98%   BMI 24.89 kg/m  General appearance: alert, cooperative and appears stated age Head: Normocephalic, without obvious abnormality, atraumatic Eyes: conj clear, EOMI, PEERLA Ears: normal TM's and external ear canals both ears Nose: Nares normal. Septum midline. Mucosa normal. No drainage or sinus tenderness. Throat: lips, mucosa, and tongue normal; teeth and gums  normal Neck: no adenopathy, no carotid bruit, no JVD, supple, symmetrical, trachea midline and thyroid not enlarged, symmetric, no tenderness/mass/nodules Back: symmetric, no curvature. ROM normal. No CVA tenderness. Lungs: clear to auscultation bilaterally Breasts: normal appearance, no masses or tenderness Heart: regular rate and rhythm, S1, S2 normal, no murmur, click, rub or gallop Abdomen: soft, non-tender; bowel sounds normal; no masses,  no organomegaly Extremities: extremities normal, atraumatic, no cyanosis or edema Pulses: 2+ and symmetric Skin: Skin color, texture, turgor normal. No rashes or lesions.  Skin with erythematous round macular lesion with fine scale on both antecubital fossas and one on her left anterior shoulder and between her breasts. Area around her neck with some mild erythema, Confluent with not scale.  Lymph nodes: Cervical, supraclavicular, and axillary nodes normal. Neurologic: Alert and oriented X 3, normal strength and tone. Normal symmetric reflexes. Normal coordination and gait    Assessment:    Healthy female exam.      Plan:     See After Visit Summary for Counseling Recommendations    Keep up a regular exercise program and make sure you are eating a healthy diet Try to eat 4 servings of dairy a day, or if you are lactose intolerant take a calcium with vitamin D daily.  Your vaccines are up to date.   Rash-based on exam in the areas and antecubital fossa it looks most consistent with  eczema. I did do a skin scraping just to rule out tinea versicolor. The area around her neck that does seem to be healing well does appear to be more of a contact dermatitis though. She denies any new soaps lotions perfumes at urgent stopped softeners etc. Has moisturize her skin regularly.

## 2016-01-06 ENCOUNTER — Encounter: Payer: Self-pay | Admitting: Family Medicine

## 2016-01-06 LAB — HEMOGLOBIN A1C
HEMOGLOBIN A1C: 5.9 % — AB (ref <5.7)
Mean Plasma Glucose: 123 mg/dL

## 2016-01-06 LAB — LIPID PANEL
CHOL/HDL RATIO: 3.5 ratio (ref <=5.0)
Cholesterol: 154 mg/dL (ref 125–200)
HDL: 44 mg/dL — AB (ref >=46)
LDL Cholesterol: 78 mg/dL (ref <130)
Triglycerides: 159 mg/dL — ABNORMAL HIGH (ref <150)
VLDL: 32 mg/dL — AB (ref <30)

## 2016-01-06 LAB — COMPLETE METABOLIC PANEL WITH GFR
ALBUMIN: 4.4 g/dL (ref 3.6–5.1)
ALK PHOS: 71 U/L (ref 33–130)
ALT: 8 U/L (ref 6–29)
AST: 15 U/L (ref 10–35)
BUN: 27 mg/dL — AB (ref 7–25)
CHLORIDE: 108 mmol/L (ref 98–110)
CO2: 27 mmol/L (ref 20–31)
Calcium: 9.2 mg/dL (ref 8.6–10.4)
Creat: 1.21 mg/dL — ABNORMAL HIGH (ref 0.50–0.99)
GFR, EST NON AFRICAN AMERICAN: 48 mL/min — AB (ref >=60)
GFR, Est African American: 55 mL/min — ABNORMAL LOW (ref >=60)
GLUCOSE: 81 mg/dL (ref 65–99)
POTASSIUM: 4.5 mmol/L (ref 3.5–5.3)
SODIUM: 142 mmol/L (ref 135–146)
Total Bilirubin: 0.3 mg/dL (ref 0.2–1.2)
Total Protein: 7.2 g/dL (ref 6.1–8.1)

## 2016-01-06 LAB — MICROALBUMIN, URINE: MICROALB UR: 0.4 mg/dL

## 2016-01-06 LAB — HEPATITIS C ANTIBODY: HCV AB: NEGATIVE

## 2016-01-06 LAB — HIV ANTIBODY (ROUTINE TESTING W REFLEX): HIV: NONREACTIVE

## 2016-01-09 LAB — FUNGAL STAIN

## 2016-01-09 MED ORDER — BETAMETHASONE DIPROPIONATE 0.05 % EX CREA: TOPICAL_CREAM | Freq: Two times a day (BID) | CUTANEOUS | 0 refills | Status: DC

## 2016-01-09 NOTE — Addendum Note (Signed)
Addended by: Beatrice Lecher D on: 01/09/2016 08:42 PM   Modules accepted: Orders

## 2016-01-24 ENCOUNTER — Ambulatory Visit (INDEPENDENT_AMBULATORY_CARE_PROVIDER_SITE_OTHER): Payer: BLUE CROSS/BLUE SHIELD

## 2016-01-24 DIAGNOSIS — Z1231 Encounter for screening mammogram for malignant neoplasm of breast: Secondary | ICD-10-CM

## 2016-01-24 DIAGNOSIS — Z0000 Encounter for general adult medical examination without abnormal findings: Secondary | ICD-10-CM

## 2016-05-03 ENCOUNTER — Other Ambulatory Visit: Payer: Self-pay | Admitting: Family Medicine

## 2016-05-07 ENCOUNTER — Ambulatory Visit: Payer: BLUE CROSS/BLUE SHIELD | Admitting: Family Medicine

## 2016-05-11 ENCOUNTER — Ambulatory Visit (INDEPENDENT_AMBULATORY_CARE_PROVIDER_SITE_OTHER): Payer: Commercial Managed Care - PPO | Admitting: Family Medicine

## 2016-05-11 ENCOUNTER — Encounter: Payer: Self-pay | Admitting: Family Medicine

## 2016-05-11 VITALS — BP 114/67 | HR 72 | Ht 64.0 in | Wt 151.0 lb

## 2016-05-11 DIAGNOSIS — E119 Type 2 diabetes mellitus without complications: Secondary | ICD-10-CM | POA: Diagnosis not present

## 2016-05-11 DIAGNOSIS — N183 Chronic kidney disease, stage 3 unspecified: Secondary | ICD-10-CM

## 2016-05-11 DIAGNOSIS — I10 Essential (primary) hypertension: Secondary | ICD-10-CM

## 2016-05-11 LAB — POCT GLYCOSYLATED HEMOGLOBIN (HGB A1C): HEMOGLOBIN A1C: 6.1

## 2016-05-11 NOTE — Progress Notes (Signed)
Subjective:    CC: HTN, DM,   HPI:  Hypertension- Pt denies chest pain, SOB, dizziness, or heart palpitations.  Taking meds as directed w/o problems.  Denies medication side effects.    Diabetes - no hypoglycemic events. No wounds or sores that are not healing well. No increased thirst or urination. Checking glucose at home. Taking medications as prescribed without any side effects.  CKD 3- no change in urination.    Says Dr. Welton Flakes released herAnd have her PCP follow her blood pressure for now on.    Past medical history, Surgical history, Family history not pertinant except as noted below, Social history, Allergies, and medications have been entered into the medical record, reviewed, and corrections made.   Review of Systems: No fevers, chills, night sweats, weight loss, chest pain, or shortness of breath.   Objective:    General: Well Developed, well nourished, and in no acute distress.  Neuro: Alert and oriented x3, extra-ocular muscles intact, sensation grossly intact.  HEENT: Normocephalic, atraumatic  Skin: Warm and dry, no rashes. Cardiac: Regular rate and rhythm, no murmurs rubs or gallops, no lower extremity edema.  Respiratory: Clear to auscultation bilaterally. Not using accessory muscles, speaking in full sentences.   Impression and Recommendations:    HTN  - Controlled looks absolutely fantastic. Due for BMP. Follow-up in 6 months.  DM- well controlled per continue current regimen. Follow-up in 3-4 months.  CKD 3- Schedule for renal ultrasound.  He for repeat BMP today.

## 2016-05-12 LAB — BASIC METABOLIC PANEL WITH GFR
BUN: 26 mg/dL — AB (ref 7–25)
CALCIUM: 9 mg/dL (ref 8.6–10.4)
CO2: 23 mmol/L (ref 20–31)
Chloride: 106 mmol/L (ref 98–110)
Creat: 1.2 mg/dL — ABNORMAL HIGH (ref 0.50–0.99)
GFR, EST AFRICAN AMERICAN: 56 mL/min — AB (ref >=60)
GFR, Est Non African American: 48 mL/min — ABNORMAL LOW (ref >=60)
GLUCOSE: 83 mg/dL (ref 65–99)
POTASSIUM: 4.3 mmol/L (ref 3.5–5.3)
Sodium: 137 mmol/L (ref 135–146)

## 2016-05-19 ENCOUNTER — Other Ambulatory Visit: Payer: Self-pay | Admitting: Family Medicine

## 2016-05-24 ENCOUNTER — Ambulatory Visit (INDEPENDENT_AMBULATORY_CARE_PROVIDER_SITE_OTHER): Payer: Commercial Managed Care - PPO

## 2016-05-24 DIAGNOSIS — I129 Hypertensive chronic kidney disease with stage 1 through stage 4 chronic kidney disease, or unspecified chronic kidney disease: Secondary | ICD-10-CM | POA: Diagnosis not present

## 2016-05-24 DIAGNOSIS — N183 Chronic kidney disease, stage 3 unspecified: Secondary | ICD-10-CM

## 2016-06-12 ENCOUNTER — Encounter: Payer: Self-pay | Admitting: Family Medicine

## 2016-06-12 MED ORDER — DOXEPIN HCL 10 MG PO CAPS: 10.0000 mg | ORAL_CAPSULE | Freq: Every day | ORAL | 1 refills | Status: DC

## 2016-06-12 NOTE — Telephone Encounter (Signed)
Hi J

## 2016-06-19 ENCOUNTER — Encounter: Payer: Self-pay | Admitting: Family Medicine

## 2016-06-19 NOTE — Telephone Encounter (Signed)
I thought could wait until tomorrow for you to address.

## 2016-06-27 LAB — LIPID PANEL
Cholesterol: 209 mg/dL — AB (ref 0–200)
HDL: 40 mg/dL (ref 35–70)
LDL CALC: 130 mg/dL
LDl/HDL Ratio: 5.2

## 2016-06-27 LAB — HEMOGLOBIN A1C: Hemoglobin A1C: 6.1

## 2016-07-12 ENCOUNTER — Encounter: Payer: Self-pay | Admitting: Family Medicine

## 2016-07-12 ENCOUNTER — Ambulatory Visit: Payer: Commercial Managed Care - PPO

## 2016-07-12 ENCOUNTER — Ambulatory Visit (INDEPENDENT_AMBULATORY_CARE_PROVIDER_SITE_OTHER): Payer: Commercial Managed Care - PPO | Admitting: Family Medicine

## 2016-07-12 VITALS — BP 109/67 | HR 87 | Ht 64.0 in | Wt 153.0 lb

## 2016-07-12 DIAGNOSIS — M79605 Pain in left leg: Secondary | ICD-10-CM

## 2016-07-12 NOTE — Progress Notes (Signed)
Subjective:    Patient ID: Bianca Mckee, female    DOB: 10-Dec-1950, 66 y.o.   MRN: [id number]  HPI  pt reports that 6 days ago her L leg behind the knee, laterally,   began to hurt.  She typically sits with her legs to the side when she watches TV. She thought maybe she just sat that position too long it was a little sore or stiff. But did remember any specific injury or trauma.  last night she stated that she was walking through her house and heard a "pop" last night and suddenly dropped to her knees it was so painful.  She said the pain is in her L calf and goes down the side, 6/10, throbbing. she has been using heat for comfort. No trauma or injury to this leg. 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     Review of Systems  BP 109/67   Pulse 87   Ht 5\' 4"  (1.626 m)   Wt 153 lb (69.4 kg)   SpO2 98%   BMI 26.26 kg/m     Allergies  Allergen Reactions  . Codeine Phosphate     Past Medical History:  Diagnosis Date  . Anxiety   . GERD (gastroesophageal reflux disease)   . Hypertension 1996    Past Surgical History:  Procedure Laterality Date  . TUBAL LIGATION      Social History   Social History  . Marital status: Divorced    Spouse name: N/A  . Number of children: N/A  . Years of education: N/A   Occupational History  . Not on file.   Social History Main Topics  . Smoking status: Never Smoker  . Smokeless tobacco: Never Used  . Alcohol use No  . Drug use: No  . Sexual activity: Not on file   Other Topics Concern  . Not on file   Social History Narrative  . No narrative on file    No family history on file.  Outpatient Encounter Prescriptions as of 07/12/2016  Medication Sig  . ALPRAZolam (XANAX) 1 MG tablet TAKE 1/2 TO 1 TABLET TWICE A DAY AS NEEDED  . AMBULATORY NON FORMULARY MEDICATION Medication Name: strips for One Touch Ultra 2  to test sugar once a day. Dx. Diabetes. E11.9  . atorvastatin (LIPITOR) 20 MG tablet Take 1 tablet (20  mg total) by mouth daily.  . citalopram (CELEXA) 40 MG tablet TAKE 1 TABLET (40 MG TOTAL) BY MOUTH DAILY.  Marland Kitchen diclofenac (VOLTAREN) 75 MG EC tablet   . doxepin (SINEQUAN) 10 MG capsule Take 1 capsule (10 mg total) by mouth at bedtime.  Marland Kitchen esomeprazole (NEXIUM) 40 MG capsule Take 1 capsule (40 mg total) by mouth daily.  Marland Kitchen linaclotide (LINZESS) 145 MCG CAPS capsule Take 1 capsule (145 mcg total) by mouth daily.  . metFORMIN (GLUCOPHAGE) 1000 MG tablet TAKE 1 TABLET (1,000 MG TOTAL) BY MOUTH 2 (TWO) TIMES DAILY WITH A MEAL.  Marland Kitchen telmisartan (MICARDIS) 40 MG tablet Take 40 mg by mouth daily.  Marland Kitchen triamcinolone cream (KENALOG) 0.5 % Apply 1 application topically 2 (two) times daily. To affected areas.  . triamterene-hydrochlorothiazide (MAXZIDE) 75-50 MG tablet Take 1 tablet by mouth daily.  . [DISCONTINUED] triamterene-hydrochlorothiazide (MAXZIDE) 75-50 MG per tablet Take 1 tablet by mouth daily.   No facility-administered encounter medications on file as of 07/12/2016.          Objective:   Physical Exam  Constitutional: She is  oriented to person, place, and time. She appears well-developed and well-nourished.  HENT:  Head: Normocephalic and atraumatic.  Eyes: Conjunctivae and EOM are normal.  Cardiovascular: Normal rate.   Pulmonary/Chest: Effort normal.  Musculoskeletal:  He is nontender over the left knee joint or posteriorly. She does have pain when she straightens the leg and dorsiflexes the foot. No tenderness with calf squeeze. Her left calf does look slightly larger on appearance but by measurement is only about half a centimeter bigger. No lower extremity edema. Strength at the knees and ankles are 5 out of 5 bilaterally.  Neurological: She is alert and oriented to person, place, and time.  Skin: Skin is dry. No pallor.  Psychiatric: She has a normal mood and affect. Her behavior is normal.  Vitals reviewed.      Assessment & Plan:  Left posterior knee pain radiating down into the  calf.  Unclear etiology at this point per. Certainly it could be muscular skeletal strain or a tendon tear that she really wasn't doing anything that would have caused this. Consider that could be a DVT. She has a prior history of DVT in her right leg after surgery.  Ultrasound was negative for DVTs recommend conservative care with anti-inflammatory, compression Ace wrap and ice. If not improving over the weekend with some gentle stretching and allowing the healing process to occur then recommend that she see one of our sports medicine doctors next week.

## 2016-07-12 NOTE — Patient Instructions (Signed)
We will call with results when available.

## 2016-07-17 ENCOUNTER — Other Ambulatory Visit: Payer: Self-pay | Admitting: Family Medicine

## 2016-07-24 LAB — HM DIABETES EYE EXAM

## 2016-08-08 ENCOUNTER — Other Ambulatory Visit: Payer: Self-pay | Admitting: Family Medicine

## 2016-08-10 ENCOUNTER — Encounter: Payer: Self-pay | Admitting: Family Medicine

## 2016-08-10 ENCOUNTER — Ambulatory Visit (INDEPENDENT_AMBULATORY_CARE_PROVIDER_SITE_OTHER): Payer: Commercial Managed Care - PPO | Admitting: Family Medicine

## 2016-08-10 ENCOUNTER — Ambulatory Visit (INDEPENDENT_AMBULATORY_CARE_PROVIDER_SITE_OTHER): Payer: Commercial Managed Care - PPO

## 2016-08-10 VITALS — BP 96/57 | HR 84 | Wt 158.0 lb

## 2016-08-10 DIAGNOSIS — R1319 Other dysphagia: Secondary | ICD-10-CM | POA: Diagnosis not present

## 2016-08-10 DIAGNOSIS — E119 Type 2 diabetes mellitus without complications: Secondary | ICD-10-CM | POA: Diagnosis not present

## 2016-08-10 DIAGNOSIS — M25562 Pain in left knee: Secondary | ICD-10-CM

## 2016-08-10 DIAGNOSIS — R1111 Vomiting without nausea: Secondary | ICD-10-CM

## 2016-08-10 DIAGNOSIS — G8929 Other chronic pain: Secondary | ICD-10-CM | POA: Diagnosis not present

## 2016-08-10 NOTE — Progress Notes (Signed)
Subjective:    CC: DM  HPI:  Diabetes - no hypoglycemic events. No wounds or sores that are not healing well. No increased thirst or urination. Checking glucose at home. Taking medications as prescribed without any side effects.She brought in an A1c done at her job/work for barometric screening. Her A1c was 6.1.  She did go to [facility] on [address]. about 2 weeks ago for her eye exam and says she overall got a good checkup.  He also complains of epigastric discomfort after eating. She says she's noticed for several weeks now that after she eats she missed feels like her foods getting stuck right at the base of her chest and stomach area. Sometimes she can, massage it and feel like it finally passes. She says typically her stomach feels very hard after eating. Also over the last 2 weeks she's actually vomited 3 times anywhere between 2:50 hours after eating. With one of the episodes she actually also had diarrhea that was only the one time.  She also complains of left knee pain. She is Artie had a right knee replacement. Now her left knee is really starting to bother her and keep her awake at night. The pain is worse on the outside of the knee. Sometimes she says it just pop and give out and she'll fall. She thinks she probably has a lot of arthritis there. No recent injury or trauma. She is open to physical therapy.   Past medical history, Surgical history, Family history not pertinant except as noted below, Social history, Allergies, and medications have been entered into the medical record, reviewed, and corrections made.   Review of Systems: No fevers, chills, night sweats, weight loss, chest pain, or shortness of breath.   Objective:    General: Well Developed, well nourished, and in no acute distress.  Neuro: Alert and oriented x3, extra-ocular muscles intact, sensation grossly intact.  HEENT: Normocephalic, atraumatic  Skin: Warm and dry, no rashes. Cardiac: Regular rate and  rhythm, no murmurs rubs or gallops, no lower extremity edema.  Respiratory: Clear to auscultation bilaterally. Not using accessory muscles, speaking in full sentences.   Impression and Recommendations:   DM- Well controlled. Continue current regimen. Follow up in  4 months.  Foot exam performed today. Labs are up-to-date. We'll cough or recent eye exam.  Dysphagia at the distal esophagus-recommend referral to GI for further evaluation. I suspect she likely has a stricture. Sounds like she is getting some early satiety and bloating with it after eating and has actually vomited a couple of times.  Left knee pain-we'll get x-ray today. She would likely benefit from physical therapy. If not improving at that point and can consider further options care.

## 2016-08-14 ENCOUNTER — Telehealth: Payer: Self-pay | Admitting: *Deleted

## 2016-08-14 ENCOUNTER — Encounter: Payer: Self-pay | Admitting: Family Medicine

## 2016-08-14 DIAGNOSIS — M25562 Pain in left knee: Principal | ICD-10-CM

## 2016-08-14 DIAGNOSIS — G8929 Other chronic pain: Secondary | ICD-10-CM

## 2016-08-14 NOTE — Telephone Encounter (Signed)
Referral placed for PT.Bianca Mckee [place]

## 2016-08-15 ENCOUNTER — Encounter: Payer: Self-pay | Admitting: Family Medicine

## 2016-08-22 ENCOUNTER — Telehealth: Payer: Self-pay | Admitting: *Deleted

## 2016-08-22 DIAGNOSIS — R1310 Dysphagia, unspecified: Secondary | ICD-10-CM

## 2016-08-22 DIAGNOSIS — R1110 Vomiting, unspecified: Secondary | ICD-10-CM

## 2016-08-22 NOTE — Telephone Encounter (Signed)
Referral placed.Bianca Mckee  

## 2016-08-24 ENCOUNTER — Encounter: Payer: Self-pay | Admitting: Family Medicine

## 2016-09-06 ENCOUNTER — Other Ambulatory Visit: Payer: Self-pay | Admitting: Family Medicine

## 2016-09-10 ENCOUNTER — Encounter: Payer: Self-pay | Admitting: Family Medicine

## 2016-09-11 MED ORDER — CELECOXIB 200 MG PO CAPS: 200.0000 mg | ORAL_CAPSULE | Freq: Two times a day (BID) | ORAL | Status: DC

## 2016-09-12 MED ORDER — TRAMADOL HCL 50 MG PO TABS: 50.0000 mg | ORAL_TABLET | Freq: Two times a day (BID) | ORAL | 0 refills | Status: DC | PRN

## 2016-09-12 NOTE — Addendum Note (Signed)
Addended by: Beatrice Lecher D on: 09/12/2016 04:55 PM   Modules accepted: Orders

## 2016-10-01 ENCOUNTER — Other Ambulatory Visit: Payer: Self-pay | Admitting: Family Medicine

## 2016-10-18 ENCOUNTER — Other Ambulatory Visit: Payer: Self-pay

## 2016-10-18 ENCOUNTER — Other Ambulatory Visit: Payer: Self-pay | Admitting: Family Medicine

## 2016-12-03 ENCOUNTER — Other Ambulatory Visit: Payer: Self-pay | Admitting: Family Medicine

## 2016-12-11 ENCOUNTER — Ambulatory Visit: Payer: Commercial Managed Care - PPO | Admitting: Family Medicine

## 2016-12-24 ENCOUNTER — Other Ambulatory Visit: Payer: Self-pay | Admitting: Family Medicine

## 2016-12-24 ENCOUNTER — Encounter: Payer: Self-pay | Admitting: Family Medicine

## 2016-12-24 ENCOUNTER — Ambulatory Visit (INDEPENDENT_AMBULATORY_CARE_PROVIDER_SITE_OTHER): Payer: Commercial Managed Care - PPO | Admitting: Family Medicine

## 2016-12-24 VITALS — BP 107/67 | HR 91 | Ht 64.0 in | Wt 159.0 lb

## 2016-12-24 DIAGNOSIS — Z1231 Encounter for screening mammogram for malignant neoplasm of breast: Secondary | ICD-10-CM

## 2016-12-24 DIAGNOSIS — E119 Type 2 diabetes mellitus without complications: Secondary | ICD-10-CM

## 2016-12-24 DIAGNOSIS — Z6827 Body mass index (BMI) 27.0-27.9, adult: Secondary | ICD-10-CM

## 2016-12-24 DIAGNOSIS — Z0000 Encounter for general adult medical examination without abnormal findings: Secondary | ICD-10-CM

## 2016-12-24 LAB — POCT GLYCOSYLATED HEMOGLOBIN (HGB A1C): HEMOGLOBIN A1C: 6.2

## 2016-12-24 NOTE — Progress Notes (Signed)
Subjective:     Bianca Mckee is a 66 y.o. female and is here for a comprehensive physical exam. The patient reports no problems.  Diabetes - no hypoglycemic events. No wounds or sores that are not healing well. No increased thirst or urination. Checking glucose at home. Taking medications as prescribed without any side effects.  She also wanted to update me that her brother was recently diagnosed with lung cancer. He was a smoker.   Social History   Social History  . Marital status: Divorced    Spouse name: N/A  . Number of children: N/A  . Years of education: N/A   Occupational History  . Not on file.   Social History Main Topics  . Smoking status: Never Smoker  . Smokeless tobacco: Never Used  . Alcohol use No  . Drug use: No  . Sexual activity: Not on file   Other Topics Concern  . Not on file   Social History Narrative  . No narrative on file   Health Maintenance  Topic Date Due  . INFLUENZA VACCINE  12/25/2016 (Originally 10/17/2016)  . HEMOGLOBIN A1C  12/27/2016  . OPHTHALMOLOGY EXAM  07/24/2017  . FOOT EXAM  08/10/2017  . MAMMOGRAM  01/23/2018  . PAP SMEAR  01/31/2018  . PNEUMOCOCCAL POLYSACCHARIDE VACCINE (2) 01/06/2020  . COLONOSCOPY  12/19/2020  . TETANUS/TDAP  06/13/2025  . Hepatitis C Screening  Completed  . HIV Screening  Completed    The following portions of the patient's history were reviewed and updated as appropriate: allergies, current medications, past family history, past medical history, past social history, past surgical history and problem list.  Review of Systems  A comprehensive review of systems was negative.   Objective:    BP 107/67   Pulse 91   Ht 5\' 4"  (1.626 m)   Wt 159 lb (72.1 kg)   SpO2 99%   BMI 27.29 kg/m   General appearance: alert, cooperative and appears stated age Head: Normocephalic, without obvious abnormality, atraumatic Eyes: conj clear, EOMI, PEERLA Ears: normal TM's and external ear canals both  ears Nose: Nares normal. Septum midline. Mucosa normal. No drainage or sinus tenderness. Throat: lips, mucosa, and tongue normal; teeth and gums normal Neck: no adenopathy, no carotid bruit, no JVD, supple, symmetrical, trachea midline and thyroid not enlarged, symmetric, no tenderness/mass/nodules Back: symmetric, no curvature. ROM normal. No CVA tenderness. Lungs: clear to auscultation bilaterally Heart: regular rate and rhythm, S1, S2 normal, no murmur, click, rub or gallop Abdomen: soft, non-tender; bowel sounds normal; no masses,  no organomegaly Extremities: extremities normal, atraumatic, no cyanosis or edema Pulses: 2+ and symmetric Skin: Skin color, texture, turgor normal. No rashes or lesions Lymph nodes: Cervical, supraclavicular, and axillary nodes normal. Neurologic: Alert and oriented X 3, normal strength and tone. Normal symmetric reflexes. Normal coordination and gait    Assessment:    Healthy female exam.      Plan:     See After Visit Summary for Counseling Recommendations   complete physical examination Keep up a regular exercise program and make sure you are eating a healthy diet Try to eat 4 servings of dairy a day, or if you are lactose intolerant take a calcium with vitamin D daily.  Your vaccines are up to date.   Lab Results  Component Value Date   HGBA1C 6.2 12/24/2016    BMI 27 - Discussed the importance of getting back on track with healthy diet and regular exercise. She admits she's  been eating out a lot and has not been working out like she no she should have. We discussed some strategies around this.  DM - A1C of 6.2. Well controlled. Continue current regimen. Follow up in  4 months. Again encouraged healthy diet and regular exercise and avoid eating fast food.

## 2016-12-25 LAB — BASIC METABOLIC PANEL WITH GFR
BUN / CREAT RATIO: 25 (calc) — AB (ref 6–22)
BUN: 27 mg/dL — ABNORMAL HIGH (ref 7–25)
CO2: 28 mmol/L (ref 20–32)
CREATININE: 1.07 mg/dL — AB (ref 0.50–0.99)
Calcium: 9 mg/dL (ref 8.6–10.4)
Chloride: 105 mmol/L (ref 98–110)
GFR, EST NON AFRICAN AMERICAN: 55 mL/min/{1.73_m2} — AB (ref >=60)
GFR, Est African American: 64 mL/min/{1.73_m2} (ref >=60)
Glucose, Bld: 107 mg/dL — ABNORMAL HIGH (ref 65–99)
Potassium: 4 mmol/L (ref 3.5–5.3)
SODIUM: 140 mmol/L (ref 135–146)

## 2017-01-07 ENCOUNTER — Other Ambulatory Visit: Payer: Self-pay | Admitting: Osteopathic Medicine

## 2017-01-30 ENCOUNTER — Ambulatory Visit (INDEPENDENT_AMBULATORY_CARE_PROVIDER_SITE_OTHER): Payer: Commercial Managed Care - PPO

## 2017-01-30 DIAGNOSIS — Z1231 Encounter for screening mammogram for malignant neoplasm of breast: Secondary | ICD-10-CM

## 2017-01-31 ENCOUNTER — Encounter: Payer: Self-pay | Admitting: Family Medicine

## 2017-01-31 ENCOUNTER — Other Ambulatory Visit: Payer: Self-pay | Admitting: Family Medicine

## 2017-01-31 MED ORDER — OMEPRAZOLE 40 MG PO CPDR: 40.0000 mg | DELAYED_RELEASE_CAPSULE | Freq: Every day | ORAL | 3 refills | Status: DC

## 2017-01-31 MED ORDER — ONDANSETRON HCL 4 MG PO TABS: 4.0000 mg | ORAL_TABLET | Freq: Three times a day (TID) | ORAL | 0 refills | Status: DC | PRN

## 2017-02-02 ENCOUNTER — Other Ambulatory Visit: Payer: Self-pay | Admitting: Family Medicine

## 2017-02-12 ENCOUNTER — Encounter: Payer: Self-pay | Admitting: Family Medicine

## 2017-03-04 ENCOUNTER — Other Ambulatory Visit: Payer: Self-pay | Admitting: Family Medicine

## 2017-03-04 NOTE — Telephone Encounter (Signed)
Please advise on medication refill...thanks  

## 2017-03-05 ENCOUNTER — Other Ambulatory Visit: Payer: Self-pay | Admitting: *Deleted

## 2017-03-05 MED ORDER — CITALOPRAM HYDROBROMIDE 40 MG PO TABS: ORAL_TABLET | ORAL | 3 refills | Status: DC

## 2017-04-09 ENCOUNTER — Other Ambulatory Visit: Payer: Self-pay | Admitting: *Deleted

## 2017-04-09 ENCOUNTER — Other Ambulatory Visit: Payer: Self-pay | Admitting: Family Medicine

## 2017-04-09 NOTE — Telephone Encounter (Signed)
Routed to pcp for signature..Bianca Mckee  

## 2017-04-10 ENCOUNTER — Other Ambulatory Visit: Payer: Self-pay | Admitting: Family Medicine

## 2017-04-10 ENCOUNTER — Other Ambulatory Visit: Payer: Self-pay | Admitting: *Deleted

## 2017-04-29 ENCOUNTER — Ambulatory Visit: Payer: Commercial Managed Care - PPO | Admitting: Family Medicine

## 2017-05-07 ENCOUNTER — Encounter: Payer: Self-pay | Admitting: Family Medicine

## 2017-05-07 ENCOUNTER — Telehealth: Payer: Self-pay | Admitting: Family Medicine

## 2017-05-07 ENCOUNTER — Ambulatory Visit (INDEPENDENT_AMBULATORY_CARE_PROVIDER_SITE_OTHER): Payer: Commercial Managed Care - PPO | Admitting: Family Medicine

## 2017-05-07 VITALS — BP 122/72 | HR 72 | Ht 64.0 in | Wt 155.0 lb

## 2017-05-07 DIAGNOSIS — E119 Type 2 diabetes mellitus without complications: Secondary | ICD-10-CM

## 2017-05-07 DIAGNOSIS — N183 Chronic kidney disease, stage 3 unspecified: Secondary | ICD-10-CM

## 2017-05-07 DIAGNOSIS — F330 Major depressive disorder, recurrent, mild: Secondary | ICD-10-CM

## 2017-05-07 DIAGNOSIS — I10 Essential (primary) hypertension: Secondary | ICD-10-CM

## 2017-05-07 LAB — POCT GLYCOSYLATED HEMOGLOBIN (HGB A1C): HEMOGLOBIN A1C: 6.1

## 2017-05-07 MED ORDER — LINACLOTIDE 145 MCG PO CAPS: 145.0000 ug | ORAL_CAPSULE | Freq: Every day | ORAL | 3 refills | Status: DC

## 2017-05-07 NOTE — Telephone Encounter (Signed)
Received fax from [facility] that [place] was approved from 04/07/2017 until 05/06/2020.  Pharmacy notified. Form sent to scan.   Reference: [id number]

## 2017-05-07 NOTE — Telephone Encounter (Signed)
Received fax from [place] was needing a PA. Awaiting determination-HM.

## 2017-05-07 NOTE — Progress Notes (Signed)
Subjective:    CC: BP, DM, kidney ck HPI:  Hypertension- Pt denies chest pain, SOB, dizziness, or heart palpitations.  Taking meds as directed w/o problems.  Denies medication side effects.    Diabetes - no hypoglycemic events. No wounds or sores that are not healing well. No increased thirst or urination. Checking glucose at home. Taking medications as prescribed without any side effects.  CKD 3 - due to recheck in 2 months.    F/U MDD - On citalopram 40mg  QD.  Effectiveness.  But she has been under some more stress recently with her knee pain and problems.  She really does not want to work miss any work because of it.  Left knee OA - she will be getting symvisc for OA in the next few months. Hoping to put off knee replacement until November.    Past medical history, Surgical history, Family history not pertinant except as noted below, Social history, Allergies, and medications have been entered into the medical record, reviewed, and corrections made.   Review of Systems: No fevers, chills, night sweats, weight loss, chest pain, or shortness of breath.   Objective:    General: Well Developed, well nourished, and in no acute distress.  Neuro: Alert and oriented x3, extra-ocular muscles intact, sensation grossly intact.  HEENT: Normocephalic, atraumatic  Skin: Warm and dry, no rashes. Cardiac: Regular rate and rhythm, no murmurs rubs or gallops, no lower extremity edema.  Respiratory: Clear to auscultation bilaterally. Not using accessory muscles, speaking in full sentences.   Impression and Recommendations:    HTN - Well controlled. Continue current regimen. Follow up in  6 months.    DM -  Well controlled. Continue current regimen. Follow up in  6 months.  A1C of 6.1.  Continue to work on dietary changes.  And also encouraged her to get back into some type of regular exercise.  I think stationary would bike would be a great choice that she has knee and shoulder problems.  CKD 3 -  STable. Due to recheck in 2 months.    MDD -  Will continue current regimen.  PHA - 9 score of 11, and GAD - 7 score of 4.  Discussed that we can always add something such as Wellbutrin to her citalopram or even consider changing the medication but typically the medication does not wear off over time.  It does not lose its of efficacy.  Left knee pain -getting Synvisc injections soon and hoping to put off knee replacement until November when she turns 67.

## 2017-05-15 ENCOUNTER — Other Ambulatory Visit: Payer: Self-pay | Admitting: Family Medicine

## 2017-05-15 DIAGNOSIS — G4700 Insomnia, unspecified: Secondary | ICD-10-CM

## 2017-05-15 NOTE — Telephone Encounter (Signed)
Pt looked up in [place]. She last filled this medication 04/09/17 #30. Will fwd to pcp for review and signature.Elouise Munroe, [place]

## 2017-05-31 ENCOUNTER — Other Ambulatory Visit: Payer: Self-pay | Admitting: Family Medicine

## 2017-06-05 ENCOUNTER — Other Ambulatory Visit: Payer: Self-pay | Admitting: Family Medicine

## 2017-07-23 LAB — LIPID PANEL
CHOLESTEROL: 225 — AB (ref 0–200)
Cholesterol: 226 — AB (ref 0–200)
HDL: 51 (ref 35–70)
LDL Cholesterol: 137
LDl/HDL Ratio: 4.4

## 2017-07-23 LAB — HEMOGLOBIN A1C: Hemoglobin A1C: 6.2 % — AB (ref 4.0–5.6)

## 2017-08-05 ENCOUNTER — Encounter: Payer: Self-pay | Admitting: Family Medicine

## 2017-08-08 ENCOUNTER — Encounter: Payer: Self-pay | Admitting: *Deleted

## 2017-08-11 ENCOUNTER — Other Ambulatory Visit: Payer: Self-pay | Admitting: Family Medicine

## 2017-08-22 ENCOUNTER — Other Ambulatory Visit: Payer: Self-pay | Admitting: Family Medicine

## 2017-08-22 DIAGNOSIS — G4700 Insomnia, unspecified: Secondary | ICD-10-CM

## 2017-09-04 NOTE — Progress Notes (Addendum)
Subjective:    CC: HTN, DM  HPI:  Hypertension- Pt denies chest pain, SOB, dizziness, or heart palpitations.  Taking meds as directed w/o problems.  Denies medication side effects.    Diabetes - no hypoglycemic events. No wounds or sores that are not healing well. No increased thirst or urination. Checking glucose at home. Taking medications as prescribed without any side effects.  She did bring a copy of blood work that she recently had done at work.  Through work her hemoglobin A1c was 6.2, total cholesterol 226, HDL 51, direct LDL was 137.  Blood pressure was 116/78 at work.  Mod depressive D/O.  She has been struggling with her mood.  She has ben stress at work.  She has a co-worker who is been keeping track of what time she is going and leaving and getting up from her desk.  It has been frustrating.  Also she has her son staying with her at night because he commutes to his job and this is been a little bit stressful as well.  She actually increased her citalopram up to 40 mg recently.  1 of her brothers is currently on hospice for pancreatic cancer and her other brother has lung cancer and they are not getting along well right now.  Past medical history, Surgical history, Family history not pertinant except as noted below, Social history, Allergies, and medications have been entered into the medical record, reviewed, and corrections made.   Review of Systems: No fevers, chills, night sweats, weight loss, chest pain, or shortness of breath.   Objective:    General: Well Developed, well nourished, and in no acute distress.  Neuro: Alert and oriented x3, extra-ocular muscles intact, sensation grossly intact.  HEENT: Normocephalic, atraumatic  Skin: Warm and dry, no rashes. Cardiac: Regular rate and rhythm, no murmurs rubs or gallops, no lower extremity edema.  Respiratory: Clear to auscultation bilaterally. Not using accessory muscles, speaking in full sentences.   Impression and  Recommendations:    HTN - Well controlled. Continue current regimen. Follow up in  4 months.    DM-well-controlled.  Continue current regimen.  Follow-up in 3 to 4 months.  Major depressive disorder, recurrent-we discussed options.  I would really rather her stick to 30 mg of citalopram is 40 mg can increase her risk for cardiac arrhythmias.  We discussed the option of adding Wellbutrin.  Also encouraged her to check into EAP through work and possibly see if her insurance will cover any therapy or counseling I think would be really beneficial as a lot of her stress is situational.  Hyperlipidemia-LDL not at goal.  Will increase atorvastatin to 40 mg.  New per scription sent to pharmacy.  Plan to recheck levels in 6 months.

## 2017-09-05 ENCOUNTER — Encounter: Payer: Self-pay | Admitting: Family Medicine

## 2017-09-05 ENCOUNTER — Ambulatory Visit (INDEPENDENT_AMBULATORY_CARE_PROVIDER_SITE_OTHER): Payer: Commercial Managed Care - PPO | Admitting: Family Medicine

## 2017-09-05 VITALS — BP 120/64 | HR 84 | Temp 98.4°F | Wt 156.0 lb

## 2017-09-05 DIAGNOSIS — F331 Major depressive disorder, recurrent, moderate: Secondary | ICD-10-CM | POA: Diagnosis not present

## 2017-09-05 DIAGNOSIS — I10 Essential (primary) hypertension: Secondary | ICD-10-CM

## 2017-09-05 DIAGNOSIS — E785 Hyperlipidemia, unspecified: Secondary | ICD-10-CM | POA: Diagnosis not present

## 2017-09-05 DIAGNOSIS — E119 Type 2 diabetes mellitus without complications: Secondary | ICD-10-CM

## 2017-09-05 LAB — COMPLETE METABOLIC PANEL WITH GFR
AG RATIO: 1.8 (calc) (ref 1.0–2.5)
ALKALINE PHOSPHATASE (APISO): 79 U/L (ref 33–130)
ALT: 11 U/L (ref 6–29)
AST: 13 U/L (ref 10–35)
Albumin: 4.6 g/dL (ref 3.6–5.1)
BUN/Creatinine Ratio: 22 (calc) (ref 6–22)
BUN: 32 mg/dL — ABNORMAL HIGH (ref 7–25)
CALCIUM: 9.6 mg/dL (ref 8.6–10.4)
CO2: 24 mmol/L (ref 20–32)
Chloride: 108 mmol/L (ref 98–110)
Creat: 1.45 mg/dL — ABNORMAL HIGH (ref 0.50–0.99)
GFR, EST NON AFRICAN AMERICAN: 38 mL/min/{1.73_m2} — AB (ref >=60)
GFR, Est African American: 44 mL/min/{1.73_m2} — ABNORMAL LOW (ref >=60)
Globulin: 2.5 g/dL (calc) (ref 1.9–3.7)
Glucose, Bld: 83 mg/dL (ref 65–99)
POTASSIUM: 4.6 mmol/L (ref 3.5–5.3)
Sodium: 142 mmol/L (ref 135–146)
Total Bilirubin: 0.3 mg/dL (ref 0.2–1.2)
Total Protein: 7.1 g/dL (ref 6.1–8.1)

## 2017-09-05 LAB — POCT GLYCOSYLATED HEMOGLOBIN (HGB A1C): HEMOGLOBIN A1C: 5.8 % — AB (ref 4.0–5.6)

## 2017-09-05 MED ORDER — BUPROPION HCL ER (XL) 150 MG PO TB24: 150.0000 mg | ORAL_TABLET | Freq: Every day | ORAL | 2 refills | Status: DC

## 2017-09-05 MED ORDER — CITALOPRAM HYDROBROMIDE 20 MG PO TABS: 30.0000 mg | ORAL_TABLET | Freq: Every day | ORAL | 1 refills | Status: DC

## 2017-09-05 MED ORDER — ATORVASTATIN CALCIUM 40 MG PO TABS: 40.0000 mg | ORAL_TABLET | Freq: Every day | ORAL | 3 refills | Status: DC

## 2017-09-06 ENCOUNTER — Other Ambulatory Visit: Payer: Self-pay | Admitting: Family Medicine

## 2017-09-11 ENCOUNTER — Encounter: Payer: Self-pay | Admitting: Family Medicine

## 2017-09-11 LAB — HEMOGLOBIN A1C: A1C: 6.2

## 2017-10-02 ENCOUNTER — Other Ambulatory Visit: Payer: Self-pay | Admitting: Family Medicine

## 2017-10-02 ENCOUNTER — Other Ambulatory Visit: Payer: Self-pay | Admitting: *Deleted

## 2017-10-15 ENCOUNTER — Ambulatory Visit (INDEPENDENT_AMBULATORY_CARE_PROVIDER_SITE_OTHER): Payer: Commercial Managed Care - PPO | Admitting: Osteopathic Medicine

## 2017-10-15 ENCOUNTER — Encounter: Payer: Self-pay | Admitting: Osteopathic Medicine

## 2017-10-15 VITALS — BP 110/67 | HR 88

## 2017-10-15 DIAGNOSIS — R21 Rash and other nonspecific skin eruption: Secondary | ICD-10-CM | POA: Diagnosis not present

## 2017-10-15 MED ORDER — METHYLPREDNISOLONE SODIUM SUCC 125 MG IJ SOLR
125.0000 mg | Freq: Once | INTRAMUSCULAR | Status: AC
Start: 2017-10-15 — End: 2017-10-15
  Administered 2017-10-15: 125 mg via INTRAMUSCULAR

## 2017-10-15 MED ORDER — BETAMETHASONE DIPROPIONATE 0.05 % EX CREA: TOPICAL_CREAM | Freq: Two times a day (BID) | CUTANEOUS | 1 refills | Status: DC

## 2017-10-15 NOTE — Patient Instructions (Signed)
Plan:  Steroid shot today  Steroid cream as needed  If no better, come see [place] or we can send you to a dermatologist if needed!

## 2017-10-15 NOTE — Progress Notes (Signed)
HPI: Bianca Mckee is a 67 y.o. female who  has a past medical history of Anxiety, GERD (gastroesophageal reflux disease), and Hypertension (1996).  she presents to [facility], 10/15/17,  for chief complaint of:  Rash  Was at the beach about a week and a half ago, rash is located on the legs and hands wherever the water touched her.  She thinks it may also have been from a new shaving cream that she was using prior to her trip.  None of her travel companions have a similar rash.  Reports itching, she thinks she is scratching it in her sleep and breaking some of the lesions open         Past medical history, surgical history, and family history reviewed.  Current medication list and allergy/intolerance information reviewed.   (See remainder of HPI, ROS, Phys Exam below)    ASSESSMENT/PLAN:   Rash and nonspecific skin eruption - Plan: methylPREDNISolone sodium succinate (SOLU-MEDROL) 125 mg/2 mL injection 125 mg   Discussed management options including oral steroids/antihistamines, topical treatment, shot today in the office.  Patient thinks that shot might be best and would like to have a cream on hand just in case.  She is advised that the skin will take time to heal fully, avoid scratching it, avoid any new exposures.  I think is most likely an allergic reaction or insect bites possibly sand mites.   Meds ordered this encounter  Medications  . betamethasone dipropionate (DIPROLENE) 0.05 % cream    Sig: Apply topically 2 (two) times daily. Limit use to 2 weeks    Dispense:  45 g    Refill:  1  . methylPREDNISolone sodium succinate (SOLU-MEDROL) 125 mg/2 mL injection 125 mg    Patient Instructions  Plan:  Steroid shot today  Steroid cream as needed  If no better, come see [place] or we can send you to a dermatologist if needed!    Follow-up plan: Return for recheck as needed  .     ############################################ ############################################ ############################################ ############################################    Outpatient Encounter Medications as of 10/15/2017  Medication Sig  . ALPRAZolam (XANAX) 1 MG tablet TAKE 0.5-1 TABLETS (0.5-1 MG TOTAL) BY MOUTH DAILY AS NEEDED FOR ANXIETY.  Marland Kitchen AMBULATORY NON FORMULARY MEDICATION Medication Name: strips for One Touch Ultra 2  to test sugar once a day. Dx. Diabetes. E11.9  . atorvastatin (LIPITOR) 40 MG tablet Take 1 tablet (40 mg total) by mouth at bedtime.  Marland Kitchen buPROPion (WELLBUTRIN XL) 150 MG 24 hr tablet TAKE 1 TABLET BY MOUTH EVERY DAY  . citalopram (CELEXA) 20 MG tablet Take 1.5 tablets (30 mg total) by mouth daily. TAKE 1 TABLET (40 MG TOTAL) BY MOUTH DAILY.  Marland Kitchen diclofenac (VOLTAREN) 75 MG EC tablet Take 75 mg by mouth every 12 (twelve) hours as needed.  . doxepin (SINEQUAN) 10 MG capsule TAKE 1 CAPSULE (10 MG TOTAL) BY MOUTH AT BEDTIME.  Marland Kitchen linaclotide (LINZESS) 145 MCG CAPS capsule Take 1 capsule (145 mcg total) by mouth daily.  . metFORMIN (GLUCOPHAGE) 1000 MG tablet TAKE 1 TABLET (1,000 MG TOTAL) BY MOUTH 2 (TWO) TIMES DAILY WITH A MEAL.  Marland Kitchen omeprazole (PRILOSEC) 40 MG capsule TAKE 1 CAPSULE BY MOUTH DAILY  . ondansetron (ZOFRAN) 4 MG tablet Take 1 tablet (4 mg total) every 8 (eight) hours as needed by mouth for nausea or vomiting.  Marland Kitchen telmisartan (MICARDIS) 40 MG tablet TAKE 1 TABLET (40 MG) BY ORAL ROUTE ONCE  DAILY  . triamterene-hydrochlorothiazide (MAXZIDE) 75-50 MG tablet Take 1 tablet by mouth daily.   No facility-administered encounter medications on file as of 10/15/2017.    Allergies  Allergen Reactions  . Codeine Phosphate       Review of Systems:  Constitutional: No recent illness  HEENT: No  headache, no vision change  Cardiac: No  chest pain, No  pressure, No palpitations  Respiratory:  No  shortness of breath. No   Cough  Gastrointestinal: No  abdominal pain, no change on bowel habits  Musculoskeletal: No new myalgia/arthralgia  Skin: +Rash  Hem/Onc: No  easy bruising/bleeding  Neurologic: No  weakness, No  Dizziness  Exam:  BP 110/67   Pulse 88   Constitutional: VS see above. General Appearance: alert, well-developed, well-nourished, NAD  Eyes: Normal lids and conjunctive, non-icteric sclera  Ears, Nose, Mouth, Throat: MMM, Normal external inspection ears/nares/mouth/lips/gums.  Neck: No masses, trachea midline.   Respiratory: Normal respiratory effort. no wheeze, no rhonchi, no rales  Cardiovascular: S1/S2 normal, no murmur, no rub/gallop auscultated. RRR.   Musculoskeletal: Gait normal. Symmetric and independent movement of all extremities  Neurological: Normal balance/coordination. No tremor.  Skin: warm, dry, intact. Rash as in photo   Psychiatric: Normal judgment/insight. Normal mood and affect. Oriented x3.       Visit summary with medication list and pertinent instructions was printed for patient to review, advised to alert [place] if any changes needed. All questions at time of visit were answered - patient instructed to contact office with any additional concerns. ER/RTC precautions were reviewed with the patient and understanding verbalized.   Follow-up plan: Return for recheck as needed .  Note: Total time spent 25 minutes, greater than 50% of the visit was spent face-to-face counseling and coordinating care for the following: The encounter diagnosis was Rash and nonspecific skin eruption..  Please note: voice recognition software was used to produce this document, and typos may escape review. Please contact Dr. Sheppard Coil for any needed clarifications.

## 2017-10-16 ENCOUNTER — Encounter: Payer: Self-pay | Admitting: Osteopathic Medicine

## 2017-10-17 ENCOUNTER — Telehealth: Payer: Self-pay | Admitting: Osteopathic Medicine

## 2017-10-17 ENCOUNTER — Encounter: Payer: Self-pay | Admitting: Osteopathic Medicine

## 2017-10-17 DIAGNOSIS — R21 Rash and other nonspecific skin eruption: Secondary | ICD-10-CM

## 2017-10-17 NOTE — Telephone Encounter (Signed)
Dermatology referral per patient request

## 2017-10-21 ENCOUNTER — Encounter: Payer: Self-pay | Admitting: Family Medicine

## 2017-10-22 ENCOUNTER — Encounter: Payer: Self-pay | Admitting: Family Medicine

## 2017-10-25 ENCOUNTER — Ambulatory Visit (INDEPENDENT_AMBULATORY_CARE_PROVIDER_SITE_OTHER): Payer: Commercial Managed Care - PPO | Admitting: Family Medicine

## 2017-10-25 ENCOUNTER — Encounter: Payer: Self-pay | Admitting: Family Medicine

## 2017-10-25 VITALS — BP 110/67 | HR 97 | Ht 64.0 in | Wt 155.0 lb

## 2017-10-25 DIAGNOSIS — R21 Rash and other nonspecific skin eruption: Secondary | ICD-10-CM | POA: Diagnosis not present

## 2017-10-25 MED ORDER — BETAMETHASONE DIPROPIONATE 0.05 % EX CREA: TOPICAL_CREAM | Freq: Every day | CUTANEOUS | 1 refills | Status: DC

## 2017-10-25 MED ORDER — CLOBETASOL PROPIONATE 0.025 % EX CREA: 1.0000 "application " | TOPICAL_CREAM | Freq: Every day | CUTANEOUS | 1 refills | Status: DC

## 2017-10-25 NOTE — Progress Notes (Signed)
Subjective:    Patient ID: Bianca Mckee, female    DOB: December 04, 1950, 67 y.o.   MRN: [id number]  HPI Comes in today to follow-up on rash that started right after she went to the beach.  She said she shaved her legs she did use a new type of shaving gel.  The next day she went to the beach was there for several days.  She denies any new sunscreens etc.  No other new soaps lotions etc. worsening or alleviating factors.  She says it really does not bother her and that is not painful but sometimes it does feel a little itchy.  They are pretty much all concentrated below the waist on her legs.  Review of Systems  BP 110/67   Pulse 97   Ht 5\' 4"  (1.626 m)   Wt 155 lb (70.3 kg)   SpO2 98%   BMI 26.61 kg/m     Allergies  Allergen Reactions  . Codeine Phosphate     Past Medical History:  Diagnosis Date  . Anxiety   . GERD (gastroesophageal reflux disease)   . Hypertension 1996    Past Surgical History:  Procedure Laterality Date  . TUBAL LIGATION      Social History   Socioeconomic History  . Marital status: Divorced    Spouse name: Not on file  . Number of children: Not on file  . Years of education: Not on file  . Highest education level: Not on file  Occupational History  . Not on file  Social Needs  . Financial resource strain: Not on file  . Food insecurity:    Worry: Not on file    Inability: Not on file  . Transportation needs:    Medical: Not on file    Non-medical: Not on file  Tobacco Use  . Smoking status: Never Smoker  . Smokeless tobacco: Never Used  Substance and Sexual Activity  . Alcohol use: No  . Drug use: No  . Sexual activity: Not on file  Lifestyle  . Physical activity:    Days per week: Not on file    Minutes per session: Not on file  . Stress: Not on file  Relationships  . Social connections:    Talks on phone: Not on file    Gets together: Not on file    Attends religious service: Not on file    Active member of club or  organization: Not on file    Attends meetings of clubs or organizations: Not on file    Relationship status: Not on file  . Intimate partner violence:    Fear of current or ex partner: Not on file    Emotionally abused: Not on file    Physically abused: Not on file    Forced sexual activity: Not on file  Other Topics Concern  . Not on file  Social History Narrative  . Not on file    Family History  Problem Relation Age of Onset  . Lung cancer Brother 79       smoker  . Pancreatic cancer Brother     Outpatient Encounter Medications as of 10/25/2017  Medication Sig  . ALPRAZolam (XANAX) 1 MG tablet TAKE 0.5-1 TABLETS (0.5-1 MG TOTAL) BY MOUTH DAILY AS NEEDED FOR ANXIETY.  Marland Kitchen AMBULATORY NON FORMULARY MEDICATION Medication Name: strips for One Touch Ultra 2  to test sugar once a day. Dx. Diabetes. E11.9  . atorvastatin (LIPITOR) 40 MG tablet Take 1 tablet (40  mg total) by mouth at bedtime.  Marland Kitchen buPROPion (WELLBUTRIN XL) 150 MG 24 hr tablet TAKE 1 TABLET BY MOUTH EVERY DAY  . citalopram (CELEXA) 20 MG tablet Take 1.5 tablets (30 mg total) by mouth daily. TAKE 1 TABLET (40 MG TOTAL) BY MOUTH DAILY.  . Clobetasol Propionate 0.025 % CREA Apply 1 application topically at bedtime. Stop after 2 weeks  . diclofenac (VOLTAREN) 75 MG EC tablet Take 75 mg by mouth every 12 (twelve) hours as needed.  . doxepin (SINEQUAN) 10 MG capsule TAKE 1 CAPSULE (10 MG TOTAL) BY MOUTH AT BEDTIME.  Marland Kitchen linaclotide (LINZESS) 145 MCG CAPS capsule Take 1 capsule (145 mcg total) by mouth daily.  . metFORMIN (GLUCOPHAGE) 1000 MG tablet TAKE 1 TABLET (1,000 MG TOTAL) BY MOUTH 2 (TWO) TIMES DAILY WITH A MEAL.  Marland Kitchen omeprazole (PRILOSEC) 40 MG capsule TAKE 1 CAPSULE BY MOUTH DAILY  . ondansetron (ZOFRAN) 4 MG tablet Take 1 tablet (4 mg total) every 8 (eight) hours as needed by mouth for nausea or vomiting.  Marland Kitchen telmisartan (MICARDIS) 40 MG tablet TAKE 1 TABLET (40 MG) BY ORAL ROUTE ONCE DAILY  . triamterene-hydrochlorothiazide  (MAXZIDE) 75-50 MG tablet Take 1 tablet by mouth daily.  . [DISCONTINUED] betamethasone dipropionate (DIPROLENE) 0.05 % cream Apply topically 2 (two) times daily. Limit use to 2 weeks  . [DISCONTINUED] betamethasone dipropionate (DIPROLENE) 0.05 % cream Apply topically daily. Limit use to 2 weeks   No facility-administered encounter medications on file as of 10/25/2017.          Objective:   Physical Exam  Constitutional: She is oriented to person, place, and time. She appears well-developed and well-nourished.  HENT:  Head: Normocephalic and atraumatic.  Eyes: Conjunctivae and EOM are normal.  Cardiovascular: Normal rate.  Pulmonary/Chest: Effort normal.  Neurological: She is alert and oriented to person, place, and time.  Skin: Skin is dry. No pallor.  She has scattered erythematous maculopapular lesions some have a little bit of scale.  None of them look vesicular.  Some local look like they are drying.  They are scattered mostly over the lower extremities below the knees but a few on the upper thighs.  None are open or weeping.  The lesion that was biopsied was on the left lower leg posteriorly was a little bit larger than the rest and had a little bit darker dusky appearance with some scale.  Psychiatric: She has a normal mood and affect. Her behavior is normal.  Vitals reviewed.         Assessment & Plan:  Rash-unclear etiology recommend punch biopsy for further diagnosis.  Actually just looks like a spongiotic reaction.  We will send over new prescription for steroid while we wait for the results as it would likely take a week to get the pathology back.  We will call her with that as soon as we get back in.  Follow-up wound care discussed.  Since she is going to be to encourage her to cover with a waterproof Band-Aid when getting in the water but otherwise just keep covered with a loose Band-Aid so it can get some air to it and apply small dab of Vaseline to moisturize the area.   Will call with results once available.  Any irritation, new skin products or scratching at the area.  Punch Biopsy Procedure Note  Pre-operative Diagnosis: Suspicious lesion, rash  Post-operative Diagnosis: normal  Locations:left posterior leg pain  Indications: rash not resolving  Anesthesia: Lidocaine 1% with epinephrine  without added sodium bicarbonate  Procedure Details  History of allergy to iodine: no Patient informed of the risks (including bleeding and infection) and benefits of the  procedure and Verbal informed consent obtained.  The lesion and surrounding area was given a sterile prep using chlorhexidine and draped in the usual sterile fashion. The skin was then stretched perpendicular to the skin tension lines and the lesion removed using the 30mm punch. The resulting ellipse was then closed.  Did use cautery to stop the bleeding because it was a 4 mm punch I did not close it with sutures.. Antibiotic ointment and a sterile dressing applied. The specimen was sent for pathologic examination. The patient tolerated the procedure well.  EBL: trace  Findings: Await pathology  Condition: Stable  Complications: none.  Plan: 1. Instructed to keep the wound dry and covered for 24-48h and clean thereafter. 2. Warning signs of infection were reviewed.   3. Recommended that the patient use OTC acetaminophen as needed for pain.  Marland Kitchen

## 2017-10-28 ENCOUNTER — Encounter: Payer: Self-pay | Admitting: Family Medicine

## 2017-10-28 MED ORDER — METFORMIN HCL 1000 MG PO TABS: 1000.0000 mg | ORAL_TABLET | Freq: Two times a day (BID) | ORAL | 1 refills | Status: DC

## 2017-10-28 MED ORDER — OMEPRAZOLE 40 MG PO CPDR
40.0000 mg | DELAYED_RELEASE_CAPSULE | Freq: Every day | ORAL | 1 refills | Status: DC
Start: 2017-10-28 — End: 2018-04-28

## 2017-10-28 MED ORDER — DOXYCYCLINE HYCLATE 100 MG PO TABS: 100.0000 mg | ORAL_TABLET | Freq: Two times a day (BID) | ORAL | 0 refills | Status: DC

## 2017-10-30 ENCOUNTER — Encounter: Payer: Self-pay | Admitting: Family Medicine

## 2017-11-01 ENCOUNTER — Encounter: Payer: Self-pay | Admitting: Family Medicine

## 2017-11-04 MED ORDER — TRIAMCINOLONE ACETONIDE 0.5 % EX OINT: 1.0000 "application " | TOPICAL_OINTMENT | Freq: Every evening | CUTANEOUS | 0 refills | Status: DC | PRN

## 2017-11-04 NOTE — Addendum Note (Signed)
Addended by: Beatrice Lecher D on: 11/04/2017 12:35 PM   Modules accepted: Orders

## 2017-11-14 ENCOUNTER — Other Ambulatory Visit: Payer: Self-pay | Admitting: Family Medicine

## 2017-11-14 DIAGNOSIS — G4700 Insomnia, unspecified: Secondary | ICD-10-CM

## 2017-11-15 NOTE — Telephone Encounter (Signed)
Routed to pcp for signature.Bianca Mckee

## 2017-11-17 ENCOUNTER — Other Ambulatory Visit: Payer: Self-pay | Admitting: Family Medicine

## 2017-12-05 ENCOUNTER — Encounter: Payer: Self-pay | Admitting: Family Medicine

## 2017-12-05 ENCOUNTER — Ambulatory Visit (INDEPENDENT_AMBULATORY_CARE_PROVIDER_SITE_OTHER): Payer: Commercial Managed Care - PPO | Admitting: Family Medicine

## 2017-12-05 VITALS — BP 118/73 | HR 83 | Ht 64.0 in | Wt 159.0 lb

## 2017-12-05 DIAGNOSIS — E119 Type 2 diabetes mellitus without complications: Secondary | ICD-10-CM

## 2017-12-05 DIAGNOSIS — I10 Essential (primary) hypertension: Secondary | ICD-10-CM | POA: Diagnosis not present

## 2017-12-05 DIAGNOSIS — F331 Major depressive disorder, recurrent, moderate: Secondary | ICD-10-CM

## 2017-12-05 LAB — POCT GLYCOSYLATED HEMOGLOBIN (HGB A1C): Hemoglobin A1C: 6.3 % — AB (ref 4.0–5.6)

## 2017-12-05 MED ORDER — BUPROPION HCL ER (XL) 300 MG PO TB24: 150.0000 mg | ORAL_TABLET | Freq: Every day | ORAL | 2 refills | Status: DC

## 2017-12-05 NOTE — Progress Notes (Addendum)
Subjective:    CC:   HPI: Diabetes - no hypoglycemic events. No wounds or sores that are not healing well. No increased thirst or urination. Checking glucose at home. Taking medications as prescribed without any side effects.  Hypertension- Pt denies chest pain, SOB, dizziness, or heart palpitations.  Taking meds as directed w/o problems.  Denies medication side effects.    F/U MDD -when I last saw her she was struggling a little bit with her mood and had actually increased her citalopram to 40 mg.  We discussed taking with 30 mg because of increased risk for acute QTprolongation and cardiac arrhythmias.  We decided to add Wellbutrin.  She had one brother diagnosed with lung cancer and one diagnosed with pancreatic cancer.    She has been taking 30 mg in fact we also started Wellbutrin.  She is been tolerating it well and has been on it for about 8 weeks but suddenly on Saturday approximately 5 days ago she just started feeling incredibly down and tearful.  She says Monday she felt a little bit better the but then by Tuesday started feeling worse again she is just had difficulty concentrating, reading even going to work.  She just feels tearful all the time and overwhelmed.  Her brother who she was not very close to did die from pancreatic cancer in August and then her other brother who has lung cancer fractured his hip and is in a rehab facility right now.  He is also been a little bit irritable.  She says she is even lashed out at her best friend.  She also wanted to let me know that she hit her head on the edge of the bathtub Tuesday night.  She is feeling okay and has not had any residual symptoms but but she did want to let me know about it.  Past medical history, Surgical history, Family history not pertinant except as noted below, Social history, Allergies, and medications have been entered into the medical record, reviewed, and corrections made.   Review of Systems: No fevers, chills, night  sweats, weight loss, chest pain, or shortness of breath.   Objective:    General: Well Developed, well nourished, and in no acute distress.  Neuro: Alert and oriented x3, extra-ocular muscles intact, sensation grossly intact.  HEENT: Normocephalic, atraumatic  Skin: Warm and dry, no rashes. Cardiac: Regular rate and rhythm, no murmurs rubs or gallops, no lower extremity edema.  Respiratory: Clear to auscultation bilaterally. Not using accessory muscles, speaking in full sentences.   Impression and Recommendations:    DM - Well controlled. Continue current regimen. Follow up in  4 months.    Mood -we discussed several different options.  Consider that the Wellbutrin could actually be aggravating her symptoms but she is Artie been on it for 8 weeks and I would think that it was making things worse she would have noticed that earlier on.  So after much discussion we decided to increase her Wellbutrin.  We decided to keep the citalopram because she is actually been on it for years and is actually done really well on it.  Unfortunately her insurance will not cover any counseling or therapy sessions she did check into it.  She did mark on her sheet that she has had some suicidal thoughts.  She said she has not wanted to act on it because of her son but has had thoughts of being better off dead.  She says she would not harm herself.  We did discuss several options including taking her out of work for a week so that she can focus on herself.  She did check on EAP through work but they only provide 3 sessions.  Did provide her a work note today and will be happy to fill out FMLA.  Plan will be to have her for for her to return next Thursday in 1 week.  F/U next Tuesday.   HTN - Well controlled. Continue current regimen. Follow up in  19months.

## 2017-12-10 ENCOUNTER — Encounter: Payer: Self-pay | Admitting: Family Medicine

## 2017-12-10 ENCOUNTER — Ambulatory Visit (INDEPENDENT_AMBULATORY_CARE_PROVIDER_SITE_OTHER): Payer: Commercial Managed Care - PPO | Admitting: Family Medicine

## 2017-12-10 VITALS — BP 117/66 | HR 75 | Ht 64.0 in | Wt 161.0 lb

## 2017-12-10 DIAGNOSIS — F331 Major depressive disorder, recurrent, moderate: Secondary | ICD-10-CM

## 2017-12-10 MED ORDER — SERTRALINE HCL 50 MG PO TABS: ORAL_TABLET | ORAL | 1 refills | Status: DC

## 2017-12-10 NOTE — Progress Notes (Signed)
   Subjective:    Patient ID: Bianca Mckee, female    DOB: 1950-08-12, 67 y.o.   MRN: [id number]  HPI 67 year old female comes in today to follow-up on her mood.  She came in on the 19th approximately 5 days ago and was having a breakdown.  She felt like she could barely work or read she was just really struggling emotionally with her mood.  She was just feeling like she was being faked everyone feels like she is actually been that way for quite some time.  She was feeling overwhelmed.  1 of her brothers who she really was not very close to died recently and the other one is actually not doing well.  We decided to increase her Wellbutrin.  She really has not noticed any change or improvement in her symptoms in fact she had a panic attack over the weekend.  She also broke up with her boyfriend who has not been supportive whatsoever.  She did go spend some time with a friend and said she did enjoy herself.  Feels like she has no energy and like even getting out shopping as an effort.  She has not really done anything around her home or clean her house since she is been home.  We did write her out of work.  She has been sleeping well at night but has been taking her doxepin but admits she is also been sleeping a lot during the day.  Sometimes is her way of just getting away from things is sleeping.   Review of Systems     Objective:   Physical Exam  Constitutional: She is oriented to person, place, and time. She appears well-developed and well-nourished.  HENT:  Head: Normocephalic and atraumatic.  Eyes: Conjunctivae and EOM are normal.  Cardiovascular: Normal rate.  Pulmonary/Chest: Effort normal.  Neurological: She is alert and oriented to person, place, and time.  Skin: Skin is warm and dry. No pallor.  Psychiatric: She has a normal mood and affect. Her behavior is normal.  Vitals reviewed.       Assessment & Plan:  Acute depressive episode-discussed options.  We will discontinue her  citalopram and wean that off and go ahead and start Zoloft.  We can keep the Wellbutrin for now though eventually I would like to get rid of it.  Think counseling would be extremely helpful and did encourage her to check into EAP through work.  I would like to see her back in about 2 weeks.  Taper written out.  She has any problems at all please call back and we can make adjustments if needed.  Time spent 20 min, > 50% spent face to face counseling about depression.

## 2017-12-10 NOTE — Patient Instructions (Addendum)
Decrease citalopram to 10mg  (1/2 tab)  daily x 8 days.  Then 1/2 tab every other day for 6 days.   ONce you get to half a tab every other day start the sertraline and follow the directions on the bottle.

## 2017-12-11 ENCOUNTER — Telehealth: Payer: Self-pay | Admitting: *Deleted

## 2017-12-11 NOTE — Telephone Encounter (Signed)
Form completed,faxed,confirmation received and scanned into patient's chart..Kylen Schliep Lynetta, CMA  

## 2017-12-11 NOTE — Telephone Encounter (Signed)
Pt informed that forms have been completed.Maryruth Eve, Lahoma Crocker, CMA

## 2017-12-17 ENCOUNTER — Encounter: Payer: Self-pay | Admitting: Family Medicine

## 2017-12-18 ENCOUNTER — Encounter: Payer: Self-pay | Admitting: Family Medicine

## 2017-12-26 ENCOUNTER — Ambulatory Visit (INDEPENDENT_AMBULATORY_CARE_PROVIDER_SITE_OTHER): Payer: Commercial Managed Care - PPO | Admitting: Family Medicine

## 2017-12-26 ENCOUNTER — Encounter: Payer: Self-pay | Admitting: Family Medicine

## 2017-12-26 VITALS — BP 110/68 | HR 99 | Ht 64.0 in | Wt 157.0 lb

## 2017-12-26 DIAGNOSIS — F5101 Primary insomnia: Secondary | ICD-10-CM | POA: Diagnosis not present

## 2017-12-26 DIAGNOSIS — F331 Major depressive disorder, recurrent, moderate: Secondary | ICD-10-CM | POA: Diagnosis not present

## 2017-12-26 NOTE — Progress Notes (Signed)
   Subjective:    Patient ID: Bianca Mckee, female    DOB: [birth year], 67 y.o.   MRN: [id number]  HPI 2-week follow-up for acute depressive episode-she is still struggling emotionally.  She has felt very jittery and hasn't been sleeping well.  She has been taking the doxepin at bedtime.  She has weaned off the citalopram and is now taking the sertraline.  Feels like her mind is racing particularly at night.  She is been up to a whole tab on the Zoloft now for about 3 days.  She will taking the Wellbutrin.   Review of Systems     Objective:   Physical Exam  Constitutional: She is oriented to person, place, and time. She appears well-developed and well-nourished.  HENT:  Head: Normocephalic and atraumatic.  Cardiovascular: Normal rate, regular rhythm and normal heart sounds.  Pulmonary/Chest: Effort normal and breath sounds normal.  Neurological: She is alert and oriented to person, place, and time.  Skin: Skin is warm and dry.  Psychiatric: She has a normal mood and affect. Her behavior is normal.       Assessment & Plan:  Acute depressive episode-she has made some improvements.  Her PHQ 9 score is 17, down from 22.  Her gad 7 score is 15 today, down from 19.  She still feeling a little anxious and jittery.  We discussed options.  I will give her some time to adjust the medication.  She is really only been out for about 10 days currently.  Plan will be to return to work on October 22 going part-time for 4 hours per work day with a maximum of 5 work days per week.  Our goal will be to try this for about 2 weeks and at that point she is doing well the plan will be to return her to full time at the end of the 2-week trial..  I will see her back on Friday, November 1.  Insomnia-I do feel like this is gotten a little worse since we made a medication change.  If hopefully this will gradually get better.  I did encourage her to hold her doxepin.  She is not convinced it really helping and if it  is not helping then I would rather discontinue it and try something else.  Plan to return to work part time on 01/07/18.  Our goal will be to hopefully get her back to work full-time after 2 weeks if she is doing well so we will see her back on November 1 to better evaluate that.

## 2017-12-27 ENCOUNTER — Telehealth: Payer: Self-pay | Admitting: *Deleted

## 2017-12-27 DIAGNOSIS — I10 Essential (primary) hypertension: Secondary | ICD-10-CM

## 2017-12-27 DIAGNOSIS — E119 Type 2 diabetes mellitus without complications: Secondary | ICD-10-CM

## 2017-12-27 NOTE — Telephone Encounter (Signed)
Form completed,faxed,confirmation received and scanned into patient's chart..Azilee Pirro Lynetta, CMA  

## 2017-12-27 NOTE — Telephone Encounter (Signed)
Pt informed that form has been completed and faxed and confirmation was received.Bianca Mckee, [place]

## 2017-12-30 ENCOUNTER — Encounter: Payer: Self-pay | Admitting: Family Medicine

## 2017-12-30 ENCOUNTER — Ambulatory Visit (INDEPENDENT_AMBULATORY_CARE_PROVIDER_SITE_OTHER): Payer: Commercial Managed Care - PPO | Admitting: Family Medicine

## 2017-12-30 ENCOUNTER — Other Ambulatory Visit: Payer: Self-pay | Admitting: Family Medicine

## 2017-12-30 VITALS — BP 135/86 | HR 91 | Temp 98.4°F | Wt 153.0 lb

## 2017-12-30 DIAGNOSIS — Z1231 Encounter for screening mammogram for malignant neoplasm of breast: Secondary | ICD-10-CM

## 2017-12-30 DIAGNOSIS — Z0000 Encounter for general adult medical examination without abnormal findings: Secondary | ICD-10-CM

## 2017-12-30 DIAGNOSIS — R7989 Other specified abnormal findings of blood chemistry: Secondary | ICD-10-CM | POA: Diagnosis not present

## 2017-12-30 NOTE — Progress Notes (Signed)
Subjective:     Bianca Mckee is a 67 y.o. female and is here for a comprehensive physical exam. The patient reports no problems.  Social History   Socioeconomic History  . Marital status: Divorced    Spouse name: Not on file  . Number of children: Not on file  . Years of education: Not on file  . Highest education level: Not on file  Occupational History  . Not on file  Social Needs  . Financial resource strain: Not on file  . Food insecurity:    Worry: Not on file    Inability: Not on file  . Transportation needs:    Medical: Not on file    Non-medical: Not on file  Tobacco Use  . Smoking status: Never Smoker  . Smokeless tobacco: Never Used  Substance and Sexual Activity  . Alcohol use: No  . Drug use: No  . Sexual activity: Not on file  Lifestyle  . Physical activity:    Days per week: Not on file    Minutes per session: Not on file  . Stress: Not on file  Relationships  . Social connections:    Talks on phone: Not on file    Gets together: Not on file    Attends religious service: Not on file    Active member of club or organization: Not on file    Attends meetings of clubs or organizations: Not on file    Relationship status: Not on file  . Intimate partner violence:    Fear of current or ex partner: Not on file    Emotionally abused: Not on file    Physically abused: Not on file    Forced sexual activity: Not on file  Other Topics Concern  . Not on file  Social History Narrative  . Not on file   Health Maintenance  Topic Date Due  . INFLUENZA VACCINE  01/16/2019 (Originally 10/17/2017)  . PAP SMEAR  01/31/2018  . HEMOGLOBIN A1C  06/05/2018  . OPHTHALMOLOGY EXAM  07/25/2018  . FOOT EXAM  09/06/2018  . MAMMOGRAM  01/31/2019  . COLONOSCOPY  12/19/2020  . TETANUS/TDAP  06/13/2025  . Hepatitis C Screening  Completed  . HIV Screening  Completed    The following portions of the patient's history were reviewed and updated as appropriate: allergies,  current medications, past family history, past medical history, past social history, past surgical history and problem list.  Review of Systems A comprehensive review of systems was negative.   Objective:    BP 135/86   Pulse 91   Temp 98.4 F (36.9 C) (Oral)   Wt 153 lb (69.4 kg)   BMI 26.26 kg/m  General appearance: alert, cooperative and appears stated age Head: Normocephalic, without obvious abnormality, atraumatic Eyes: conj clear, EOMI, PEERLA Ears: normal TM's and external ear canals both ears Nose: Nares normal. Septum midline. Mucosa normal. No drainage or sinus tenderness. Throat: lips, mucosa, and tongue normal; teeth and gums normal Neck: no adenopathy, no carotid bruit, no JVD, supple, symmetrical, trachea midline and thyroid not enlarged, symmetric, no tenderness/mass/nodules Back: symmetric, no curvature. ROM normal. No CVA tenderness. Lungs: clear to auscultation bilaterally Breasts: normal appearance, no masses or tenderness Heart: regular rate and rhythm, S1, S2 normal, no murmur, click, rub or gallop Abdomen: soft, non-tender; bowel sounds normal; no masses,  no organomegaly Extremities: extremities normal, atraumatic, no cyanosis or edema Pulses: 2+ and symmetric Skin: Skin color, texture, turgor normal. No rashes or lesions  Lymph nodes: Cervical, supraclavicular, and axillary nodes normal. Neurologic: Alert and oriented X 3, normal strength and tone. Normal symmetric reflexes. Normal coordination and gait    Assessment:    Healthy female exam.      Plan:     See After Visit Summary for Counseling Recommendations    Keep up a regular exercise program and make sure you are eating a healthy diet Try to eat 4 servings of dairy a day, or if you are lactose intolerant take a calcium with vitamin D daily.  Your vaccines are up to date.   Doing extremely shaky and jittery on the sertraline.  She was on a half a tab for 6 days and has been on the whole tab for  a week and still just is shaking to the point where the most uncontrollable.  On 30 night she had palpitations and was shaking so bad she had to use 2 hands to bring a cup of water to her mouth.  She was particularly upset that might be because most of the people around her work couples and they were talking about retirement and traveling and she just did not feel like she fit in.  She still felt extremely shaky since then but not as bad as Saturday night.  Decrease sertraline back down to 25 mg and also decrease her Wellbutrin back down to 150 mg.  She will call on Wednesday and let us know how she is doing.

## 2017-12-30 NOTE — Patient Instructions (Addendum)
Cut the zoloft in half.  Take half a tab daily and call on Wednesday afternoon and see if the Jitteriness is better.   Decrease the Wellbutrin down to 150mg 

## 2017-12-31 LAB — BASIC METABOLIC PANEL WITH GFR
BUN/Creatinine Ratio: 21 (calc) (ref 6–22)
BUN: 25 mg/dL (ref 7–25)
CHLORIDE: 104 mmol/L (ref 98–110)
CO2: 23 mmol/L (ref 20–32)
CREATININE: 1.17 mg/dL — AB (ref 0.50–0.99)
Calcium: 9.8 mg/dL (ref 8.6–10.4)
GFR, Est African American: 57 mL/min/{1.73_m2} — ABNORMAL LOW (ref >=60)
GFR, Est Non African American: 49 mL/min/{1.73_m2} — ABNORMAL LOW (ref >=60)
GLUCOSE: 117 mg/dL — AB (ref 65–99)
POTASSIUM: 4.2 mmol/L (ref 3.5–5.3)
SODIUM: 139 mmol/L (ref 135–146)

## 2018-01-01 ENCOUNTER — Telehealth: Payer: Self-pay

## 2018-01-01 NOTE — Telephone Encounter (Signed)
Awesome. Stick with half on both.  On Friday ok to stop the Wellbutrin.

## 2018-01-01 NOTE — Telephone Encounter (Signed)
Bianca Mckee states she has cut the Wellbutrin and Zoloft in half. She is doing better. She is not 100 % but she is better.

## 2018-01-02 ENCOUNTER — Other Ambulatory Visit: Payer: Self-pay | Admitting: Family Medicine

## 2018-01-02 ENCOUNTER — Encounter: Payer: Self-pay | Admitting: Family Medicine

## 2018-01-02 MED ORDER — ZOLPIDEM TARTRATE 5 MG PO TABS: 2.5000 mg | ORAL_TABLET | Freq: Every evening | ORAL | 1 refills | Status: DC | PRN

## 2018-01-03 NOTE — Telephone Encounter (Signed)
Patient advised of recommendations.  

## 2018-01-07 ENCOUNTER — Other Ambulatory Visit (HOSPITAL_COMMUNITY)
Admission: RE | Admit: 2018-01-07 | Discharge: 2018-01-07 | Disposition: A | Discharge status: Home / Self Care | Payer: Commercial Managed Care - PPO | Source: Ambulatory Visit | Attending: Physician Assistant | Admitting: Physician Assistant

## 2018-01-07 ENCOUNTER — Ambulatory Visit (INDEPENDENT_AMBULATORY_CARE_PROVIDER_SITE_OTHER): Payer: Commercial Managed Care - PPO | Admitting: Physician Assistant

## 2018-01-07 ENCOUNTER — Encounter: Payer: Self-pay | Admitting: Physician Assistant

## 2018-01-07 VITALS — BP 96/65 | HR 96 | Temp 98.3°F | Wt 152.0 lb

## 2018-01-07 DIAGNOSIS — Z124 Encounter for screening for malignant neoplasm of cervix: Secondary | ICD-10-CM

## 2018-01-07 DIAGNOSIS — Z7251 High risk heterosexual behavior: Secondary | ICD-10-CM

## 2018-01-07 DIAGNOSIS — R87612 Low grade squamous intraepithelial lesion on cytologic smear of cervix (LGSIL): Secondary | ICD-10-CM | POA: Insufficient documentation

## 2018-01-07 DIAGNOSIS — R87820 Cervical low risk human papillomavirus (HPV) DNA test positive: Secondary | ICD-10-CM

## 2018-01-07 DIAGNOSIS — R300 Dysuria: Secondary | ICD-10-CM | POA: Diagnosis not present

## 2018-01-07 DIAGNOSIS — Z113 Encounter for screening for infections with a predominantly sexual mode of transmission: Secondary | ICD-10-CM

## 2018-01-07 MED ORDER — AZITHROMYCIN 250 MG PO TABS: 1000.0000 mg | ORAL_TABLET | Freq: Once | ORAL | Status: DC

## 2018-01-07 MED ORDER — CEFUROXIME AXETIL 250 MG PO TABS: 250.0000 mg | ORAL_TABLET | Freq: Two times a day (BID) | ORAL | 0 refills | Status: AC

## 2018-01-07 MED ORDER — CEFTRIAXONE SODIUM 250 MG IJ SOLR
250.0000 mg | Freq: Once | INTRAMUSCULAR | Status: AC
  Administered 2018-01-07: 250 mg via INTRAMUSCULAR

## 2018-01-07 NOTE — Progress Notes (Signed)
HPI:                                                                Bianca Mckee is a 67 y.o. female who presents to [facility] today for dysuria  Dysuria   This is a new problem. The current episode started in the past 7 days. The problem occurs every urination. The problem has been gradually worsening. The quality of the pain is described as burning. There has been no fever. She is sexually active. There is no history of pyelonephritis. Associated symptoms include a discharge (clear) and frequency. Pertinent negatives include no flank pain, hematuria, nausea or vomiting. Associated symptoms comments: + suprapubic pressure. There is no history of catheterization, kidney stones or recurrent UTIs.        Past Medical History:  Diagnosis Date  . Anxiety   . GERD (gastroesophageal reflux disease)   . Hypertension 1996   Past Surgical History:  Procedure Laterality Date  . TUBAL LIGATION     Social History   Tobacco Use  . Smoking status: Never Smoker  . Smokeless tobacco: Never Used  Substance Use Topics  . Alcohol use: No   family history includes Lung cancer (age of onset: 53) in her brother; Pancreatic cancer in her brother.    ROS: negative except as noted in the HPI  Medications: Current Outpatient Medications  Medication Sig Dispense Refill  . ALPRAZolam (XANAX) 1 MG tablet TAKE 1/2 TO 1 TABLET BY MOUTH DAILY AS NEEDED FOR ANXIETY 30 tablet 3  . AMBULATORY NON FORMULARY MEDICATION Medication Name: strips for One Touch Ultra 2  to test sugar once a day. Dx. Diabetes. E11.9 90 Units PRN  . atorvastatin (LIPITOR) 40 MG tablet Take 1 tablet (40 mg total) by mouth at bedtime. 90 tablet 3  . buPROPion (WELLBUTRIN XL) 150 MG 24 hr tablet Take 1 tablet (150 mg total) by mouth daily. 90 tablet 1  . diclofenac (VOLTAREN) 75 MG EC tablet Take 75 mg by mouth every 12 (twelve) hours as needed.  1  . doxepin (SINEQUAN) 10 MG  capsule TAKE 1 CAPSULE (10 MG TOTAL) BY MOUTH AT BEDTIME. 90 capsule 1  . linaclotide (LINZESS) 145 MCG CAPS capsule Take 1 capsule (145 mcg total) by mouth daily. 90 capsule 3  . meloxicam (MOBIC) 7.5 MG tablet Take 7.5 mg by mouth.    . metFORMIN (GLUCOPHAGE) 1000 MG tablet Take 1 tablet (1,000 mg total) by mouth 2 (two) times daily with a meal. 180 tablet 1  . omeprazole (PRILOSEC) 40 MG capsule Take 1 capsule (40 mg total) by mouth daily. 90 capsule 1  . ondansetron (ZOFRAN) 4 MG tablet Take 1 tablet (4 mg total) every 8 (eight) hours as needed by mouth for nausea or vomiting. 15 tablet 0  . sertraline (ZOLOFT) 50 MG tablet Take 1 tablet (50 mg total) by mouth daily. 90 tablet 1  . telmisartan (MICARDIS) 40 MG tablet TAKE 1 TABLET (40 MG) BY ORAL ROUTE ONCE DAILY  3  . triamcinolone ointment (KENALOG) 0.5 % Apply 1 application topically at bedtime as needed. 60 g 0  . triamterene-hydrochlorothiazide (MAXZIDE) 75-50 MG tablet Take 1 tablet by mouth daily.    Marland Kitchen zolpidem (AMBIEN)  5 MG tablet Take 0.5-1 tablets (2.5-5 mg total) by mouth at bedtime as needed for sleep. 15 tablet 1   No current facility-administered medications for this visit.    Allergies  Allergen Reactions  . Codeine Phosphate        Objective:  BP 96/65   Pulse 96   Temp 98.3 F (36.8 C) (Oral)   Wt 152 lb (68.9 kg)   BMI 26.09 kg/m  Gen:  alert, not ill-appearing, no distress, appropriate for age 69: head normocephalic without obvious abnormality, conjunctiva and cornea clear, trachea midline Pulm: Normal work of breathing, normal phonation GI: abdomen soft, suprapubic tenderness, no CVA tenderness GU: vulva without rashes or lesions, normal introitus and urethral meatus, vaginal mucosa without erythema, scant amount of whitish discharge, cervix non-friable without lesions  A chaperone was used for the GU portion of the exam, Bianca Mckee, CMA.     No results found for this or any previous visit (from  the past 72 hour(s)). No results found.    Assessment and Plan: 67 y.o. female with   .[name] was seen today for dysuria.  Diagnoses and all orders for this visit:  Dysuria -     cefUROXime (CEFTIN) 250 MG tablet; Take 1 tablet (250 mg total) by mouth 2 (two) times daily with a meal for 7 days.  Unprotected sexual intercourse -     Cytology - PAP -     HIV Antibody (routine testing w rflx) -     Hepatitis C antibody -     RPR  Routine screening for STI (sexually transmitted infection) -     Cytology - PAP -     HIV Antibody (routine testing w rflx) -     Hepatitis C antibody -     RPR  Encounter for Pap smear of cervix with HPV DNA cotesting -     Cytology - PAP   - patient unable to void - will treat empirically for uncomplicated cystitis with ceftin - pap smear was also collected as patient was due - patient is very anxious about possibility of STI, possible exposure, would like empiric treatment. Rocephin and Azithromcyin given in office today  Patient education and anticipatory guidance given Patient agrees with treatment plan Follow-up as needed if symptoms worsen or fail to improve  Darlyne Russian PA-C

## 2018-01-07 NOTE — Patient Instructions (Signed)

## 2018-01-08 LAB — RPR: RPR Ser Ql: NONREACTIVE

## 2018-01-08 LAB — HEPATITIS C ANTIBODY
Hepatitis C Ab: NONREACTIVE
SIGNAL TO CUT-OFF: 0.04 (ref <1.00)

## 2018-01-08 LAB — HIV ANTIBODY (ROUTINE TESTING W REFLEX): HIV: NONREACTIVE

## 2018-01-10 ENCOUNTER — Encounter: Payer: Self-pay | Admitting: Physician Assistant

## 2018-01-10 ENCOUNTER — Encounter: Payer: Self-pay | Admitting: Family Medicine

## 2018-01-10 LAB — CYTOLOGY - PAP
Chlamydia: NEGATIVE
HPV 16/18/45 genotyping: NEGATIVE
HPV: DETECTED — AB
Neisseria Gonorrhea: NEGATIVE
Trichomonas: NEGATIVE

## 2018-01-17 ENCOUNTER — Ambulatory Visit (INDEPENDENT_AMBULATORY_CARE_PROVIDER_SITE_OTHER): Payer: Commercial Managed Care - PPO | Admitting: Family Medicine

## 2018-01-17 ENCOUNTER — Encounter: Payer: Self-pay | Admitting: Family Medicine

## 2018-01-17 VITALS — BP 118/72 | HR 92 | Ht 62.6 in | Wt 153.0 lb

## 2018-01-17 DIAGNOSIS — F331 Major depressive disorder, recurrent, moderate: Secondary | ICD-10-CM

## 2018-01-17 NOTE — Progress Notes (Signed)
   Subjective:    Patient ID: Bianca Mckee, female    DOB: 1950-09-25, 67 y.o.   MRN: [id number]  HPI  67 year old female is here today for follow-up major depressive disorder -she is doing well since we have cut back on her medication.  She does expel it feeling extremely anxious and jittery so we had her cut her medication back down to 25 mg of Zoloft and 150 mg of Wellbutrin.  She is feeling much better overall. She is back to work.  She is feeling much less anxious and jittery.  She is been doing okay with doing 4 hours a day at work.  She says she is had a few moments where she felt a little shaky but says she was able to control her emotions and not get angry.  Work is still a major stressor for her but she says it also has been good to get out of the house.  Review of Systems     Objective:   Physical Exam  Constitutional: She is oriented to person, place, and time. She appears well-developed and well-nourished.  HENT:  Head: Normocephalic and atraumatic.  Cardiovascular: Normal rate, regular rhythm and normal heart sounds.  Pulmonary/Chest: Effort normal and breath sounds normal.  Neurological: She is alert and oriented to person, place, and time.  Skin: Skin is warm and dry.  Psychiatric: She has a normal mood and affect. Her behavior is normal.        Assessment & Plan:  MDD - she is doing much better.  F/u in 2 months.  PHQ - 9 score of 7 and GAD-7 score of 3.  Continue with current regimen for now.  We removed Wellbutrin for medication list.  She feels like she is okay to go back to work full-time starting next Wednesday, November 6.  We will update her FMLA paperwork.  Still trying to find a counselor that will take her Medicare  Time spent 20 min, > 50% face-to-face counseling about major depressive disorder and completing disability paperwork.

## 2018-01-19 ENCOUNTER — Encounter: Payer: Self-pay | Admitting: Physician Assistant

## 2018-01-19 DIAGNOSIS — R87820 Cervical low risk human papillomavirus (HPV) DNA test positive: Secondary | ICD-10-CM | POA: Insufficient documentation

## 2018-01-20 ENCOUNTER — Ambulatory Visit (INDEPENDENT_AMBULATORY_CARE_PROVIDER_SITE_OTHER): Payer: Commercial Managed Care - PPO | Admitting: Obstetrics & Gynecology

## 2018-01-20 ENCOUNTER — Encounter: Payer: Self-pay | Admitting: Obstetrics & Gynecology

## 2018-01-20 VITALS — BP 135/83 | HR 89 | Resp 16 | Ht 64.0 in | Wt 151.0 lb

## 2018-01-20 DIAGNOSIS — R87612 Low grade squamous intraepithelial lesion on cytologic smear of cervix (LGSIL): Secondary | ICD-10-CM

## 2018-01-20 NOTE — Patient Instructions (Addendum)
COLPOSCOPY POST-PROCEDURE INSTRUCTIONS  1. You may take Ibuprofen, Aleve or Tylenol for cramping if needed.  2. If Monsel's solution was used, you will have a black discharge.  3. Light bleeding is normal.  If bleeding is heavier than your period, please call.  4. Put nothing in your vagina until the bleeding or discharge stops (usually 2 or3 days).  5. We will call you within one week with biopsy results or discuss the results at your follow-up appointment if needed.   Colposcopy, Care After This sheet gives you information about how to care for yourself after your procedure. Your health care provider may also give you more specific instructions. If you have problems or questions, contact your health care provider. What can I expect after the procedure? If you had a colposcopy without a biopsy, you can expect to feel fine right away, but you may have some spotting for a few days. You can go back to your normal activities. If you had a colposcopy with a biopsy, it is common to have:  Soreness and pain. This may last for a few days.  Light-headedness.  Mild vaginal bleeding or dark-colored, grainy discharge. This may last for a few days. The discharge may be due to a solution that was used during the procedure. You may need to wear a sanitary pad during this time.  Spotting for at least 48 hours after the procedure.  Follow these instructions at home:  Take over-the-counter and prescription medicines only as told by your health care provider. Talk with your health care provider about what type of over-the-counter pain medicine and prescription medicine you can start taking again. It is especially important to talk with your health care provider if you take blood-thinning medicine.  Do not drive or use heavy machinery while taking prescription pain medicine.  For at least 3 days after your procedure, or as long as told by your health care provider, avoid: ? Douching. ? Using  tampons. ? Having sexual intercourse.  Continue to use birth control (contraception).  Limit your physical activity for the first day after the procedure as told by your health care provider. Ask your health care provider what activities are safe for you.  It is up to you to get the results of your procedure. Ask your health care provider, or the department performing the procedure, when your results will be ready.  Keep all follow-up visits as told by your health care provider. This is important. Contact a health care provider if:  You develop a skin rash. Get help right away if:  You are bleeding heavily from your vagina or you are passing blood clots. This includes using more than one sanitary pad per hour for 2 hours in a row.  You have a fever or chills.  You have pelvic pain.  You have abnormal, yellow-colored, or bad-smelling vaginal discharge. This could be a sign of infection.  You have severe pain or cramps in your lower abdomen that do not get better with medicine.  You feel light-headed or dizzy, or you faint. Summary  If you had a colposcopy without a biopsy, you can expect to feel fine right away, but you may have some spotting for a few days. You can go back to your normal activities.  If you had a colposcopy with a biopsy, you may notice mild pain and spotting for 48 hours after the procedure.  Avoid douching, using tampons, and having sexual intercourse for 3 days after the procedure  or as long as told by your health care provider.  Contact your health care provider if you have bleeding, severe pain, or signs of infection. This information is not intended to replace advice given to you by your health care provider. Make sure you discuss any questions you have with your health care provider. Document Released: 12/24/2012 Document Revised: 10/21/2015 Document Reviewed: 10/21/2015 Elsevier Interactive Patient Education  2018 [employer].

## 2018-01-20 NOTE — Progress Notes (Signed)
    GYNECOLOGY CLINIC COLPOSCOPY PROCEDURE NOTE  67 y.o. G1P1 here for colposcopy for Low grade squamous intraepithelial lesion (LGSIL) at risk for high grade squamous intraepithelial lesion (HGSIL) on cytologic smear of cervix on 01/07/2018. Discussed role for HPV in cervical dysplasia, need for surveillance.  Patient given informed consent, signed copy in the chart, time out was performed.  Placed in lithotomy position. Cervix viewed with speculum and colposcope after application of acetic acid.   Colposcopy adequate? Yes No visible ectocervical lesions.  ECC specimen obtained, labeled and sent to pathology.   Patient was given post procedure instructions.  Will follow up pathology and manage accordingly; patient will be contacted with results and recommendations.  Routine preventative health maintenance measures emphasized.    Bianca Schneiders, MD, [place] [facility] [employer], [place]

## 2018-01-23 ENCOUNTER — Telehealth: Payer: Self-pay | Admitting: *Deleted

## 2018-01-23 NOTE — Telephone Encounter (Signed)
Pt notified of colpo results and per Dr A she is scheduled for LEEP on 02/06/18

## 2018-01-23 NOTE — Telephone Encounter (Signed)
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 Patient needs LEEP, have her come in discuss this result with any surgeon soon and plan for in-office LEEP

## 2018-01-23 NOTE — Progress Notes (Signed)
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 Patient needs LEEP, have her come in discuss this result with any surgeon soon and plan for in-office LEEP

## 2018-01-31 ENCOUNTER — Other Ambulatory Visit: Payer: Self-pay | Admitting: Family Medicine

## 2018-01-31 DIAGNOSIS — M25511 Pain in right shoulder: Secondary | ICD-10-CM | POA: Diagnosis not present

## 2018-01-31 DIAGNOSIS — M7541 Impingement syndrome of right shoulder: Secondary | ICD-10-CM | POA: Diagnosis not present

## 2018-01-31 NOTE — Telephone Encounter (Signed)
Routing to pcp for signature.Maryon Kemnitz Lynetta, CMA  

## 2018-02-03 ENCOUNTER — Telehealth: Payer: Self-pay | Admitting: *Deleted

## 2018-02-03 NOTE — Telephone Encounter (Signed)
Form completed,faxed,confirmation received and scanned into patient's chart..Riko Lumsden Lynetta, CMA  

## 2018-02-06 ENCOUNTER — Ambulatory Visit (INDEPENDENT_AMBULATORY_CARE_PROVIDER_SITE_OTHER): Payer: Commercial Managed Care - PPO

## 2018-02-06 ENCOUNTER — Ambulatory Visit (INDEPENDENT_AMBULATORY_CARE_PROVIDER_SITE_OTHER): Payer: Commercial Managed Care - PPO | Admitting: Family Medicine

## 2018-02-06 ENCOUNTER — Encounter: Payer: Self-pay | Admitting: Family Medicine

## 2018-02-06 VITALS — BP 123/73 | HR 94 | Resp 16 | Ht 64.0 in | Wt 153.0 lb

## 2018-02-06 DIAGNOSIS — Z1231 Encounter for screening mammogram for malignant neoplasm of breast: Secondary | ICD-10-CM

## 2018-02-06 DIAGNOSIS — R87613 High grade squamous intraepithelial lesion on cytologic smear of cervix (HGSIL): Secondary | ICD-10-CM

## 2018-02-06 DIAGNOSIS — N871 Moderate cervical dysplasia: Secondary | ICD-10-CM

## 2018-02-06 NOTE — Progress Notes (Addendum)
   GYNECOLOGY OFFICE PROCEDURE NOTE  Bianca Mckee is a 67 y.o. G1P1 here for LEEP. No GYN concerns. Pap smear and colposcopy history reviewed.    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  Role of HPV,cervical dysplasia and need for close followup was empasized. Informed written consent was obtained. All questions were answered. Time out performed. Urine pregnancy test was negative.  Procedure: The patient was placed in lithotomy position and the bivalved coated speculum was placed in the patient's vagina. A grounding pad placed on the patient.  Local anesthesia was administered via an intracervical block using 20 ml of 2% Lidocaine with epinephrine. Colposcopy completed. The suction was turned on and the Medium 1X Fisher Cone Biopsy Excisor on 63 Watts of blended current was used to excise the area of decreased uptake and excise the entire transformation zone. Excellent hemostasis was achieved using roller ball coagulation set at 50 Watts coagulation current. Monsel's solution was then applied and the speculum was removed from the vagina. Specimens were sent to pathology.  The patient tolerated the procedure well. Post-operative instructions given to patient, including instruction to seek medical attention for persistent bright red bleeding, fever, abdominal/pelvic pain, dysuria, nausea or vomiting. She was also told about the possibility of having copious yellow to black tinged discharge for weeks. She was counseled to avoid anything in the vagina (sex/douching/tampons) for 3 weeks.   Donnamae Jude, MD 02/06/2018 5:39 PM

## 2018-02-06 NOTE — Patient Instructions (Signed)
Loop Electrosurgical Excision Procedure, Care After  Refer to this sheet in the next few weeks. These instructions provide you with information about caring for yourself after your procedure. Your health care provider may also give you more specific instructions. Your treatment has been planned according to current medical practices, but problems sometimes occur. Call your health care provider if you have any problems or questions after your procedure.  What can I expect after the procedure?  After the procedure, it is common to have:  · Abdominal cramps that are similar to menstrual cramps. These may last for up to 1 week.  · Pink-tinged or bloody vaginal discharge, including light to moderate bleeding, for 1-2 weeks.  · A dark-colored vaginal discharge. This is from the paste that was applied to your cervix to control bleeding.    Follow these instructions at home:  Activity  · Return to your normal activities as told by your health care provider. Ask your health care provider what activities are safe for you.  · Avoid strenuous physical activity for as long as told by your health care provider.  · Do not lift anything that is heavier than 10 lb (4.5 kg) until your health care provider says that it is safe.  Bathing  · Do not take baths, swim, or use a hot tub until your health care provider approves.  · You may take showers.  Lifestyle  · Do not put anything in your vagina for 2 weeks after the procedure or until your health care provider says that it is okay. This includes tampons, creams, and douches.  · Do not have sexual intercourse until your health care provider approves.  General instructions  · Take over-the-counter and prescription medicines only as told by your health care provider.  · Keep all follow-up visits as told by your health care provider. This is important.  Contact a health care provider if:  · You have a fever or chills.  · You feel unusually weak.  · You have vaginal bleeding that is  heavier or longer than a normal menstrual cycle. A sign of this can be soaking a pad with blood.  · You have severe pain.  · You have nausea or vomiting.  · You develop a bad smelling vaginal discharge.  This information is not intended to replace advice given to you by your health care provider. Make sure you discuss any questions you have with your health care provider.  Document Released: 11/16/2010 Document Revised: 04/01/2015 Document Reviewed: 01/17/2015  Elsevier Interactive Patient Education © 2018 [employer]

## 2018-02-07 ENCOUNTER — Encounter: Payer: Self-pay | Admitting: Family Medicine

## 2018-02-20 ENCOUNTER — Ambulatory Visit: Payer: Commercial Managed Care - PPO | Admitting: Obstetrics & Gynecology

## 2018-02-20 ENCOUNTER — Encounter: Payer: Self-pay | Admitting: Family Medicine

## 2018-02-20 ENCOUNTER — Encounter: Payer: Self-pay | Admitting: Obstetrics & Gynecology

## 2018-02-20 VITALS — BP 148/83 | HR 73 | Resp 16 | Ht 64.0 in | Wt 153.0 lb

## 2018-02-20 DIAGNOSIS — Z9889 Other specified postprocedural states: Secondary | ICD-10-CM

## 2018-02-20 NOTE — Progress Notes (Signed)
   Subjective:    Patient ID: Bianca Mckee, female    DOB: 02/04/1951, 67 y.o.   MRN: [id number]  HPI 67 yo lady here because she is worried because she is still having some light bleeding after a LEEP done 02/06/18. She has no other complaints.   Review of Systems     Objective:   Physical Exam Breathing, conversing, and ambulating normally Well nourished, well hydrated White female, no apparent distress Cervix- healing well Pathology showed the following:  Cervix, LEEP - FOCAL HIGH GRADE SQUAMOUS INTRAEPITHELIAL LESION, CIN-II (MODERATE DYSPLASIA). - LOW GRADE SQUAMOUS INTRAEPITHELIAL LESION, CIN-I (MILD DYSPLASIA) INVOLVES ECTOCERVICAL MARGIN. - MARGINS ARE NEGATIVE FOR HIGH GRADE SQUAMOUS INTRAEPITHELIAL LESION.     Assessment & Plan:  S/p LEEP- doing well Reassurance given Come back in 6 months for a pap smear

## 2018-02-21 ENCOUNTER — Encounter: Payer: Self-pay | Admitting: Family Medicine

## 2018-02-21 DIAGNOSIS — F331 Major depressive disorder, recurrent, moderate: Secondary | ICD-10-CM

## 2018-02-21 MED ORDER — HYDROXYZINE HCL 25 MG PO TABS: 25.0000 mg | ORAL_TABLET | Freq: Three times a day (TID) | ORAL | 0 refills | Status: DC | PRN

## 2018-02-25 ENCOUNTER — Other Ambulatory Visit: Payer: Self-pay | Admitting: *Deleted

## 2018-02-25 DIAGNOSIS — F331 Major depressive disorder, recurrent, moderate: Secondary | ICD-10-CM

## 2018-02-26 ENCOUNTER — Other Ambulatory Visit: Payer: Self-pay | Admitting: *Deleted

## 2018-03-17 ENCOUNTER — Ambulatory Visit (INDEPENDENT_AMBULATORY_CARE_PROVIDER_SITE_OTHER): Payer: Commercial Managed Care - PPO | Admitting: Family Medicine

## 2018-03-17 ENCOUNTER — Encounter: Payer: Self-pay | Admitting: Family Medicine

## 2018-03-17 VITALS — BP 119/66 | HR 88 | Ht 62.0 in | Wt 150.0 lb

## 2018-03-17 DIAGNOSIS — E119 Type 2 diabetes mellitus without complications: Secondary | ICD-10-CM | POA: Diagnosis not present

## 2018-03-17 DIAGNOSIS — F331 Major depressive disorder, recurrent, moderate: Secondary | ICD-10-CM

## 2018-03-17 DIAGNOSIS — I10 Essential (primary) hypertension: Secondary | ICD-10-CM

## 2018-03-17 LAB — POCT GLYCOSYLATED HEMOGLOBIN (HGB A1C): Hemoglobin A1C: 6 % — AB (ref 4.0–5.6)

## 2018-03-17 MED ORDER — BUPROPION HCL ER (XL) 150 MG PO TB24: 150.0000 mg | ORAL_TABLET | Freq: Every day | ORAL | 1 refills | Status: DC

## 2018-03-17 NOTE — Progress Notes (Signed)
Subjective:    CC: BP and DM    HPI: Hypertension- Pt denies chest pain, SOB, dizziness, or heart palpitations.  Taking meds as directed w/o problems.  Denies medication side effects.    Diabetes - no hypoglycemic events. No wounds or sores that are not healing well. No increased thirst or urination. Checking glucose at home. Taking medications as prescribed without any side effects.  F/U MDD -she is here today to follow-up for her mood.  She reports that her interest and pleasure doing things several days of the week and trouble with sleep.  She is also reporting feeling nervous on edge daily.  No thoughts of wanting to harm herself.  She had called between visits and asked to restart her Wellbutrin.  She still had some at home so encouraged her to go ahead and do so.  She does feel like things are working better since being back on that medication at 150 mg.  She like a new prescription sent to the pharmacy.  Past medical history, Surgical history, Family history not pertinant except as noted below, Social history, Allergies, and medications have been entered into the medical record, reviewed, and corrections made.   Review of Systems: No fevers, chills, night sweats, weight loss, chest pain, or shortness of breath.   Objective:    General: Well Developed, well nourished, and in no acute distress.  Neuro: Alert and oriented x3, extra-ocular muscles intact, sensation grossly intact.  HEENT: Normocephalic, atraumatic  Skin: Warm and dry, no rashes. Cardiac: Regular rate and rhythm, no murmurs rubs or gallops, no lower extremity edema.  Respiratory: Clear to auscultation bilaterally. Not using accessory muscles, speaking in full sentences.   Impression and Recommendations:    HTN - Well controlled. Continue current regimen. Follow up in  3-4 months.   DM - Well controlled. Continue current regimen. Follow up in  3-4 months.    MDD -PHQ 9 score of 8, previous of 7.  Gad 7 score of 5  today with a previous of 3.  Rates her symptoms is somewhat difficult.  We discussed options.  She would like to restart her Wellbutrin.  She has restarted her Wellbutrin and is doing well on it so new prescription sent to pharmacy.

## 2018-03-18 ENCOUNTER — Encounter: Payer: Self-pay | Admitting: Family Medicine

## 2018-03-18 DIAGNOSIS — Z7984 Long term (current) use of oral hypoglycemic drugs: Secondary | ICD-10-CM | POA: Diagnosis not present

## 2018-03-18 DIAGNOSIS — Z885 Allergy status to narcotic agent status: Secondary | ICD-10-CM | POA: Diagnosis not present

## 2018-03-18 DIAGNOSIS — Z79899 Other long term (current) drug therapy: Secondary | ICD-10-CM | POA: Diagnosis not present

## 2018-03-18 DIAGNOSIS — G8918 Other acute postprocedural pain: Secondary | ICD-10-CM | POA: Diagnosis not present

## 2018-03-18 DIAGNOSIS — M1990 Unspecified osteoarthritis, unspecified site: Secondary | ICD-10-CM | POA: Diagnosis not present

## 2018-03-18 DIAGNOSIS — M7501 Adhesive capsulitis of right shoulder: Secondary | ICD-10-CM | POA: Diagnosis not present

## 2018-03-18 DIAGNOSIS — M24111 Other articular cartilage disorders, right shoulder: Secondary | ICD-10-CM | POA: Diagnosis not present

## 2018-03-18 DIAGNOSIS — M19011 Primary osteoarthritis, right shoulder: Secondary | ICD-10-CM | POA: Diagnosis not present

## 2018-03-18 DIAGNOSIS — M75111 Incomplete rotator cuff tear or rupture of right shoulder, not specified as traumatic: Secondary | ICD-10-CM | POA: Diagnosis not present

## 2018-03-18 DIAGNOSIS — M7541 Impingement syndrome of right shoulder: Secondary | ICD-10-CM | POA: Diagnosis not present

## 2018-03-18 DIAGNOSIS — E119 Type 2 diabetes mellitus without complications: Secondary | ICD-10-CM | POA: Diagnosis not present

## 2018-03-18 DIAGNOSIS — S46211A Strain of muscle, fascia and tendon of other parts of biceps, right arm, initial encounter: Secondary | ICD-10-CM | POA: Diagnosis not present

## 2018-03-18 DIAGNOSIS — M75121 Complete rotator cuff tear or rupture of right shoulder, not specified as traumatic: Secondary | ICD-10-CM | POA: Diagnosis not present

## 2018-03-18 DIAGNOSIS — M66311 Spontaneous rupture of flexor tendons, right shoulder: Secondary | ICD-10-CM | POA: Diagnosis not present

## 2018-03-18 DIAGNOSIS — E7800 Pure hypercholesterolemia, unspecified: Secondary | ICD-10-CM | POA: Diagnosis not present

## 2018-03-20 ENCOUNTER — Ambulatory Visit: Payer: Medicare Other | Admitting: Family Medicine

## 2018-03-26 DIAGNOSIS — M25611 Stiffness of right shoulder, not elsewhere classified: Secondary | ICD-10-CM | POA: Diagnosis not present

## 2018-03-26 DIAGNOSIS — M6281 Muscle weakness (generalized): Secondary | ICD-10-CM | POA: Diagnosis not present

## 2018-03-26 DIAGNOSIS — M25511 Pain in right shoulder: Secondary | ICD-10-CM | POA: Diagnosis not present

## 2018-03-26 DIAGNOSIS — M75121 Complete rotator cuff tear or rupture of right shoulder, not specified as traumatic: Secondary | ICD-10-CM | POA: Diagnosis not present

## 2018-03-28 DIAGNOSIS — M25611 Stiffness of right shoulder, not elsewhere classified: Secondary | ICD-10-CM | POA: Diagnosis not present

## 2018-03-28 DIAGNOSIS — M6281 Muscle weakness (generalized): Secondary | ICD-10-CM | POA: Diagnosis not present

## 2018-03-28 DIAGNOSIS — M25511 Pain in right shoulder: Secondary | ICD-10-CM | POA: Diagnosis not present

## 2018-03-28 DIAGNOSIS — M75121 Complete rotator cuff tear or rupture of right shoulder, not specified as traumatic: Secondary | ICD-10-CM | POA: Diagnosis not present

## 2018-04-01 DIAGNOSIS — M25511 Pain in right shoulder: Secondary | ICD-10-CM | POA: Diagnosis not present

## 2018-04-01 DIAGNOSIS — M25611 Stiffness of right shoulder, not elsewhere classified: Secondary | ICD-10-CM | POA: Diagnosis not present

## 2018-04-01 DIAGNOSIS — M6281 Muscle weakness (generalized): Secondary | ICD-10-CM | POA: Diagnosis not present

## 2018-04-01 DIAGNOSIS — M75121 Complete rotator cuff tear or rupture of right shoulder, not specified as traumatic: Secondary | ICD-10-CM | POA: Diagnosis not present

## 2018-04-03 DIAGNOSIS — M25611 Stiffness of right shoulder, not elsewhere classified: Secondary | ICD-10-CM | POA: Diagnosis not present

## 2018-04-03 DIAGNOSIS — M75121 Complete rotator cuff tear or rupture of right shoulder, not specified as traumatic: Secondary | ICD-10-CM | POA: Diagnosis not present

## 2018-04-03 DIAGNOSIS — M6281 Muscle weakness (generalized): Secondary | ICD-10-CM | POA: Diagnosis not present

## 2018-04-03 DIAGNOSIS — M25511 Pain in right shoulder: Secondary | ICD-10-CM | POA: Diagnosis not present

## 2018-04-07 DIAGNOSIS — M25511 Pain in right shoulder: Secondary | ICD-10-CM | POA: Diagnosis not present

## 2018-04-07 DIAGNOSIS — M25611 Stiffness of right shoulder, not elsewhere classified: Secondary | ICD-10-CM | POA: Diagnosis not present

## 2018-04-07 DIAGNOSIS — M6281 Muscle weakness (generalized): Secondary | ICD-10-CM | POA: Diagnosis not present

## 2018-04-07 DIAGNOSIS — M75121 Complete rotator cuff tear or rupture of right shoulder, not specified as traumatic: Secondary | ICD-10-CM | POA: Diagnosis not present

## 2018-04-10 DIAGNOSIS — M75121 Complete rotator cuff tear or rupture of right shoulder, not specified as traumatic: Secondary | ICD-10-CM | POA: Diagnosis not present

## 2018-04-10 DIAGNOSIS — M25511 Pain in right shoulder: Secondary | ICD-10-CM | POA: Diagnosis not present

## 2018-04-10 DIAGNOSIS — M6281 Muscle weakness (generalized): Secondary | ICD-10-CM | POA: Diagnosis not present

## 2018-04-10 DIAGNOSIS — M25611 Stiffness of right shoulder, not elsewhere classified: Secondary | ICD-10-CM | POA: Diagnosis not present

## 2018-04-14 DIAGNOSIS — M75121 Complete rotator cuff tear or rupture of right shoulder, not specified as traumatic: Secondary | ICD-10-CM | POA: Diagnosis not present

## 2018-04-14 DIAGNOSIS — M6281 Muscle weakness (generalized): Secondary | ICD-10-CM | POA: Diagnosis not present

## 2018-04-14 DIAGNOSIS — M25511 Pain in right shoulder: Secondary | ICD-10-CM | POA: Diagnosis not present

## 2018-04-14 DIAGNOSIS — M25611 Stiffness of right shoulder, not elsewhere classified: Secondary | ICD-10-CM | POA: Diagnosis not present

## 2018-04-17 DIAGNOSIS — M75121 Complete rotator cuff tear or rupture of right shoulder, not specified as traumatic: Secondary | ICD-10-CM | POA: Diagnosis not present

## 2018-04-17 DIAGNOSIS — M25511 Pain in right shoulder: Secondary | ICD-10-CM | POA: Diagnosis not present

## 2018-04-17 DIAGNOSIS — M6281 Muscle weakness (generalized): Secondary | ICD-10-CM | POA: Diagnosis not present

## 2018-04-17 DIAGNOSIS — M25611 Stiffness of right shoulder, not elsewhere classified: Secondary | ICD-10-CM | POA: Diagnosis not present

## 2018-04-21 DIAGNOSIS — M25611 Stiffness of right shoulder, not elsewhere classified: Secondary | ICD-10-CM | POA: Diagnosis not present

## 2018-04-21 DIAGNOSIS — M75121 Complete rotator cuff tear or rupture of right shoulder, not specified as traumatic: Secondary | ICD-10-CM | POA: Diagnosis not present

## 2018-04-21 DIAGNOSIS — M6281 Muscle weakness (generalized): Secondary | ICD-10-CM | POA: Diagnosis not present

## 2018-04-21 DIAGNOSIS — M25511 Pain in right shoulder: Secondary | ICD-10-CM | POA: Diagnosis not present

## 2018-04-23 ENCOUNTER — Other Ambulatory Visit: Payer: Self-pay | Admitting: Family Medicine

## 2018-04-25 DIAGNOSIS — M25511 Pain in right shoulder: Secondary | ICD-10-CM | POA: Diagnosis not present

## 2018-04-25 DIAGNOSIS — M75121 Complete rotator cuff tear or rupture of right shoulder, not specified as traumatic: Secondary | ICD-10-CM | POA: Diagnosis not present

## 2018-04-25 DIAGNOSIS — M6281 Muscle weakness (generalized): Secondary | ICD-10-CM | POA: Diagnosis not present

## 2018-04-25 DIAGNOSIS — M25611 Stiffness of right shoulder, not elsewhere classified: Secondary | ICD-10-CM | POA: Diagnosis not present

## 2018-04-26 ENCOUNTER — Other Ambulatory Visit: Payer: Self-pay | Admitting: Family Medicine

## 2018-04-29 DIAGNOSIS — M6281 Muscle weakness (generalized): Secondary | ICD-10-CM | POA: Diagnosis not present

## 2018-04-29 DIAGNOSIS — M25611 Stiffness of right shoulder, not elsewhere classified: Secondary | ICD-10-CM | POA: Diagnosis not present

## 2018-04-29 DIAGNOSIS — M75121 Complete rotator cuff tear or rupture of right shoulder, not specified as traumatic: Secondary | ICD-10-CM | POA: Diagnosis not present

## 2018-04-29 DIAGNOSIS — M25511 Pain in right shoulder: Secondary | ICD-10-CM | POA: Diagnosis not present

## 2018-05-02 DIAGNOSIS — M25611 Stiffness of right shoulder, not elsewhere classified: Secondary | ICD-10-CM | POA: Diagnosis not present

## 2018-05-02 DIAGNOSIS — M75121 Complete rotator cuff tear or rupture of right shoulder, not specified as traumatic: Secondary | ICD-10-CM | POA: Diagnosis not present

## 2018-05-02 DIAGNOSIS — M6281 Muscle weakness (generalized): Secondary | ICD-10-CM | POA: Diagnosis not present

## 2018-05-02 DIAGNOSIS — M25511 Pain in right shoulder: Secondary | ICD-10-CM | POA: Diagnosis not present

## 2018-05-06 DIAGNOSIS — M25511 Pain in right shoulder: Secondary | ICD-10-CM | POA: Diagnosis not present

## 2018-05-06 DIAGNOSIS — M25611 Stiffness of right shoulder, not elsewhere classified: Secondary | ICD-10-CM | POA: Diagnosis not present

## 2018-05-06 DIAGNOSIS — M75121 Complete rotator cuff tear or rupture of right shoulder, not specified as traumatic: Secondary | ICD-10-CM | POA: Diagnosis not present

## 2018-05-06 DIAGNOSIS — M6281 Muscle weakness (generalized): Secondary | ICD-10-CM | POA: Diagnosis not present

## 2018-05-09 DIAGNOSIS — M25611 Stiffness of right shoulder, not elsewhere classified: Secondary | ICD-10-CM | POA: Diagnosis not present

## 2018-05-09 DIAGNOSIS — M75121 Complete rotator cuff tear or rupture of right shoulder, not specified as traumatic: Secondary | ICD-10-CM | POA: Diagnosis not present

## 2018-05-09 DIAGNOSIS — M25511 Pain in right shoulder: Secondary | ICD-10-CM | POA: Diagnosis not present

## 2018-05-09 DIAGNOSIS — M6281 Muscle weakness (generalized): Secondary | ICD-10-CM | POA: Diagnosis not present

## 2018-05-12 ENCOUNTER — Other Ambulatory Visit: Payer: Self-pay | Admitting: Family Medicine

## 2018-05-12 DIAGNOSIS — G4700 Insomnia, unspecified: Secondary | ICD-10-CM

## 2018-05-13 DIAGNOSIS — M25511 Pain in right shoulder: Secondary | ICD-10-CM | POA: Diagnosis not present

## 2018-05-13 DIAGNOSIS — M6281 Muscle weakness (generalized): Secondary | ICD-10-CM | POA: Diagnosis not present

## 2018-05-13 DIAGNOSIS — M25611 Stiffness of right shoulder, not elsewhere classified: Secondary | ICD-10-CM | POA: Diagnosis not present

## 2018-05-13 DIAGNOSIS — M75121 Complete rotator cuff tear or rupture of right shoulder, not specified as traumatic: Secondary | ICD-10-CM | POA: Diagnosis not present

## 2018-05-14 ENCOUNTER — Other Ambulatory Visit: Payer: Self-pay | Admitting: Family Medicine

## 2018-05-16 DIAGNOSIS — M25611 Stiffness of right shoulder, not elsewhere classified: Secondary | ICD-10-CM | POA: Diagnosis not present

## 2018-05-16 DIAGNOSIS — M6281 Muscle weakness (generalized): Secondary | ICD-10-CM | POA: Diagnosis not present

## 2018-05-16 DIAGNOSIS — M25511 Pain in right shoulder: Secondary | ICD-10-CM | POA: Diagnosis not present

## 2018-05-16 DIAGNOSIS — M75121 Complete rotator cuff tear or rupture of right shoulder, not specified as traumatic: Secondary | ICD-10-CM | POA: Diagnosis not present

## 2018-05-19 DIAGNOSIS — M6281 Muscle weakness (generalized): Secondary | ICD-10-CM | POA: Diagnosis not present

## 2018-05-19 DIAGNOSIS — M25511 Pain in right shoulder: Secondary | ICD-10-CM | POA: Diagnosis not present

## 2018-05-19 DIAGNOSIS — M75121 Complete rotator cuff tear or rupture of right shoulder, not specified as traumatic: Secondary | ICD-10-CM | POA: Diagnosis not present

## 2018-05-19 DIAGNOSIS — M25611 Stiffness of right shoulder, not elsewhere classified: Secondary | ICD-10-CM | POA: Diagnosis not present

## 2018-05-21 DIAGNOSIS — M75121 Complete rotator cuff tear or rupture of right shoulder, not specified as traumatic: Secondary | ICD-10-CM | POA: Diagnosis not present

## 2018-05-21 DIAGNOSIS — M6281 Muscle weakness (generalized): Secondary | ICD-10-CM | POA: Diagnosis not present

## 2018-05-21 DIAGNOSIS — M25511 Pain in right shoulder: Secondary | ICD-10-CM | POA: Diagnosis not present

## 2018-05-21 DIAGNOSIS — M25611 Stiffness of right shoulder, not elsewhere classified: Secondary | ICD-10-CM | POA: Diagnosis not present

## 2018-05-22 DIAGNOSIS — M25562 Pain in left knee: Secondary | ICD-10-CM | POA: Diagnosis not present

## 2018-05-22 DIAGNOSIS — M1712 Unilateral primary osteoarthritis, left knee: Secondary | ICD-10-CM | POA: Diagnosis not present

## 2018-05-23 DIAGNOSIS — M25611 Stiffness of right shoulder, not elsewhere classified: Secondary | ICD-10-CM | POA: Diagnosis not present

## 2018-05-23 DIAGNOSIS — M6281 Muscle weakness (generalized): Secondary | ICD-10-CM | POA: Diagnosis not present

## 2018-05-23 DIAGNOSIS — M75121 Complete rotator cuff tear or rupture of right shoulder, not specified as traumatic: Secondary | ICD-10-CM | POA: Diagnosis not present

## 2018-05-23 DIAGNOSIS — M25511 Pain in right shoulder: Secondary | ICD-10-CM | POA: Diagnosis not present

## 2018-05-26 DIAGNOSIS — M25511 Pain in right shoulder: Secondary | ICD-10-CM | POA: Diagnosis not present

## 2018-05-26 DIAGNOSIS — M75121 Complete rotator cuff tear or rupture of right shoulder, not specified as traumatic: Secondary | ICD-10-CM | POA: Diagnosis not present

## 2018-05-26 DIAGNOSIS — M6281 Muscle weakness (generalized): Secondary | ICD-10-CM | POA: Diagnosis not present

## 2018-05-26 DIAGNOSIS — M25611 Stiffness of right shoulder, not elsewhere classified: Secondary | ICD-10-CM | POA: Diagnosis not present

## 2018-05-28 DIAGNOSIS — M6281 Muscle weakness (generalized): Secondary | ICD-10-CM | POA: Diagnosis not present

## 2018-05-28 DIAGNOSIS — M25611 Stiffness of right shoulder, not elsewhere classified: Secondary | ICD-10-CM | POA: Diagnosis not present

## 2018-05-28 DIAGNOSIS — M75121 Complete rotator cuff tear or rupture of right shoulder, not specified as traumatic: Secondary | ICD-10-CM | POA: Diagnosis not present

## 2018-05-28 DIAGNOSIS — M25511 Pain in right shoulder: Secondary | ICD-10-CM | POA: Diagnosis not present

## 2018-05-30 DIAGNOSIS — M6281 Muscle weakness (generalized): Secondary | ICD-10-CM | POA: Diagnosis not present

## 2018-05-30 DIAGNOSIS — M75121 Complete rotator cuff tear or rupture of right shoulder, not specified as traumatic: Secondary | ICD-10-CM | POA: Diagnosis not present

## 2018-05-30 DIAGNOSIS — M25611 Stiffness of right shoulder, not elsewhere classified: Secondary | ICD-10-CM | POA: Diagnosis not present

## 2018-05-30 DIAGNOSIS — M25511 Pain in right shoulder: Secondary | ICD-10-CM | POA: Diagnosis not present

## 2018-06-02 DIAGNOSIS — M25611 Stiffness of right shoulder, not elsewhere classified: Secondary | ICD-10-CM | POA: Diagnosis not present

## 2018-06-02 DIAGNOSIS — M75121 Complete rotator cuff tear or rupture of right shoulder, not specified as traumatic: Secondary | ICD-10-CM | POA: Diagnosis not present

## 2018-06-02 DIAGNOSIS — M6281 Muscle weakness (generalized): Secondary | ICD-10-CM | POA: Diagnosis not present

## 2018-06-02 DIAGNOSIS — M25511 Pain in right shoulder: Secondary | ICD-10-CM | POA: Diagnosis not present

## 2018-06-04 DIAGNOSIS — M75121 Complete rotator cuff tear or rupture of right shoulder, not specified as traumatic: Secondary | ICD-10-CM | POA: Diagnosis not present

## 2018-06-04 DIAGNOSIS — M6281 Muscle weakness (generalized): Secondary | ICD-10-CM | POA: Diagnosis not present

## 2018-06-04 DIAGNOSIS — M25511 Pain in right shoulder: Secondary | ICD-10-CM | POA: Diagnosis not present

## 2018-06-04 DIAGNOSIS — M25611 Stiffness of right shoulder, not elsewhere classified: Secondary | ICD-10-CM | POA: Diagnosis not present

## 2018-06-06 DIAGNOSIS — M75121 Complete rotator cuff tear or rupture of right shoulder, not specified as traumatic: Secondary | ICD-10-CM | POA: Diagnosis not present

## 2018-06-06 DIAGNOSIS — M25611 Stiffness of right shoulder, not elsewhere classified: Secondary | ICD-10-CM | POA: Diagnosis not present

## 2018-06-06 DIAGNOSIS — M6281 Muscle weakness (generalized): Secondary | ICD-10-CM | POA: Diagnosis not present

## 2018-06-06 DIAGNOSIS — M25511 Pain in right shoulder: Secondary | ICD-10-CM | POA: Diagnosis not present

## 2018-06-09 ENCOUNTER — Other Ambulatory Visit: Payer: Self-pay | Admitting: Family Medicine

## 2018-06-09 DIAGNOSIS — M25511 Pain in right shoulder: Secondary | ICD-10-CM | POA: Diagnosis not present

## 2018-06-09 DIAGNOSIS — M25611 Stiffness of right shoulder, not elsewhere classified: Secondary | ICD-10-CM | POA: Diagnosis not present

## 2018-06-09 DIAGNOSIS — M6281 Muscle weakness (generalized): Secondary | ICD-10-CM | POA: Diagnosis not present

## 2018-06-09 DIAGNOSIS — M75121 Complete rotator cuff tear or rupture of right shoulder, not specified as traumatic: Secondary | ICD-10-CM | POA: Diagnosis not present

## 2018-06-11 DIAGNOSIS — M6281 Muscle weakness (generalized): Secondary | ICD-10-CM | POA: Diagnosis not present

## 2018-06-11 DIAGNOSIS — M25611 Stiffness of right shoulder, not elsewhere classified: Secondary | ICD-10-CM | POA: Diagnosis not present

## 2018-06-11 DIAGNOSIS — M75121 Complete rotator cuff tear or rupture of right shoulder, not specified as traumatic: Secondary | ICD-10-CM | POA: Diagnosis not present

## 2018-06-11 DIAGNOSIS — M25511 Pain in right shoulder: Secondary | ICD-10-CM | POA: Diagnosis not present

## 2018-06-13 DIAGNOSIS — M75121 Complete rotator cuff tear or rupture of right shoulder, not specified as traumatic: Secondary | ICD-10-CM | POA: Diagnosis not present

## 2018-06-13 DIAGNOSIS — M25511 Pain in right shoulder: Secondary | ICD-10-CM | POA: Diagnosis not present

## 2018-06-13 DIAGNOSIS — M6281 Muscle weakness (generalized): Secondary | ICD-10-CM | POA: Diagnosis not present

## 2018-06-13 DIAGNOSIS — M25611 Stiffness of right shoulder, not elsewhere classified: Secondary | ICD-10-CM | POA: Diagnosis not present

## 2018-06-16 ENCOUNTER — Ambulatory Visit: Payer: Medicare Other | Admitting: Family Medicine

## 2018-06-16 DIAGNOSIS — M25611 Stiffness of right shoulder, not elsewhere classified: Secondary | ICD-10-CM | POA: Diagnosis not present

## 2018-06-16 DIAGNOSIS — M6281 Muscle weakness (generalized): Secondary | ICD-10-CM | POA: Diagnosis not present

## 2018-06-16 DIAGNOSIS — M25511 Pain in right shoulder: Secondary | ICD-10-CM | POA: Diagnosis not present

## 2018-06-16 DIAGNOSIS — M75121 Complete rotator cuff tear or rupture of right shoulder, not specified as traumatic: Secondary | ICD-10-CM | POA: Diagnosis not present

## 2018-06-18 DIAGNOSIS — M25511 Pain in right shoulder: Secondary | ICD-10-CM | POA: Diagnosis not present

## 2018-06-18 DIAGNOSIS — M75121 Complete rotator cuff tear or rupture of right shoulder, not specified as traumatic: Secondary | ICD-10-CM | POA: Diagnosis not present

## 2018-06-18 DIAGNOSIS — M25611 Stiffness of right shoulder, not elsewhere classified: Secondary | ICD-10-CM | POA: Diagnosis not present

## 2018-06-18 DIAGNOSIS — M6281 Muscle weakness (generalized): Secondary | ICD-10-CM | POA: Diagnosis not present

## 2018-06-20 DIAGNOSIS — M75121 Complete rotator cuff tear or rupture of right shoulder, not specified as traumatic: Secondary | ICD-10-CM | POA: Diagnosis not present

## 2018-06-20 DIAGNOSIS — M25511 Pain in right shoulder: Secondary | ICD-10-CM | POA: Diagnosis not present

## 2018-06-20 DIAGNOSIS — M6281 Muscle weakness (generalized): Secondary | ICD-10-CM | POA: Diagnosis not present

## 2018-06-20 DIAGNOSIS — M25611 Stiffness of right shoulder, not elsewhere classified: Secondary | ICD-10-CM | POA: Diagnosis not present

## 2018-06-23 DIAGNOSIS — M25511 Pain in right shoulder: Secondary | ICD-10-CM | POA: Diagnosis not present

## 2018-06-23 DIAGNOSIS — M25611 Stiffness of right shoulder, not elsewhere classified: Secondary | ICD-10-CM | POA: Diagnosis not present

## 2018-06-23 DIAGNOSIS — M75121 Complete rotator cuff tear or rupture of right shoulder, not specified as traumatic: Secondary | ICD-10-CM | POA: Diagnosis not present

## 2018-06-23 DIAGNOSIS — M6281 Muscle weakness (generalized): Secondary | ICD-10-CM | POA: Diagnosis not present

## 2018-06-24 DIAGNOSIS — M25511 Pain in right shoulder: Secondary | ICD-10-CM | POA: Diagnosis not present

## 2018-06-25 DIAGNOSIS — M75121 Complete rotator cuff tear or rupture of right shoulder, not specified as traumatic: Secondary | ICD-10-CM | POA: Diagnosis not present

## 2018-06-25 DIAGNOSIS — M6281 Muscle weakness (generalized): Secondary | ICD-10-CM | POA: Diagnosis not present

## 2018-06-25 DIAGNOSIS — M25611 Stiffness of right shoulder, not elsewhere classified: Secondary | ICD-10-CM | POA: Diagnosis not present

## 2018-06-25 DIAGNOSIS — M25511 Pain in right shoulder: Secondary | ICD-10-CM | POA: Diagnosis not present

## 2018-06-27 DIAGNOSIS — M6281 Muscle weakness (generalized): Secondary | ICD-10-CM | POA: Diagnosis not present

## 2018-06-27 DIAGNOSIS — M25611 Stiffness of right shoulder, not elsewhere classified: Secondary | ICD-10-CM | POA: Diagnosis not present

## 2018-06-27 DIAGNOSIS — M75121 Complete rotator cuff tear or rupture of right shoulder, not specified as traumatic: Secondary | ICD-10-CM | POA: Diagnosis not present

## 2018-06-27 DIAGNOSIS — M25511 Pain in right shoulder: Secondary | ICD-10-CM | POA: Diagnosis not present

## 2018-06-30 DIAGNOSIS — M75121 Complete rotator cuff tear or rupture of right shoulder, not specified as traumatic: Secondary | ICD-10-CM | POA: Diagnosis not present

## 2018-06-30 DIAGNOSIS — M25611 Stiffness of right shoulder, not elsewhere classified: Secondary | ICD-10-CM | POA: Diagnosis not present

## 2018-06-30 DIAGNOSIS — M6281 Muscle weakness (generalized): Secondary | ICD-10-CM | POA: Diagnosis not present

## 2018-06-30 DIAGNOSIS — M25511 Pain in right shoulder: Secondary | ICD-10-CM | POA: Diagnosis not present

## 2018-07-02 ENCOUNTER — Encounter: Payer: Self-pay | Admitting: Family Medicine

## 2018-07-02 DIAGNOSIS — M6281 Muscle weakness (generalized): Secondary | ICD-10-CM | POA: Diagnosis not present

## 2018-07-02 DIAGNOSIS — M25611 Stiffness of right shoulder, not elsewhere classified: Secondary | ICD-10-CM | POA: Diagnosis not present

## 2018-07-02 DIAGNOSIS — M25511 Pain in right shoulder: Secondary | ICD-10-CM | POA: Diagnosis not present

## 2018-07-02 DIAGNOSIS — M75121 Complete rotator cuff tear or rupture of right shoulder, not specified as traumatic: Secondary | ICD-10-CM | POA: Diagnosis not present

## 2018-07-07 DIAGNOSIS — M75121 Complete rotator cuff tear or rupture of right shoulder, not specified as traumatic: Secondary | ICD-10-CM | POA: Diagnosis not present

## 2018-07-07 DIAGNOSIS — M6281 Muscle weakness (generalized): Secondary | ICD-10-CM | POA: Diagnosis not present

## 2018-07-07 DIAGNOSIS — M25611 Stiffness of right shoulder, not elsewhere classified: Secondary | ICD-10-CM | POA: Diagnosis not present

## 2018-07-07 DIAGNOSIS — M25511 Pain in right shoulder: Secondary | ICD-10-CM | POA: Diagnosis not present

## 2018-07-08 ENCOUNTER — Ambulatory Visit: Payer: Commercial Managed Care - PPO | Admitting: Family Medicine

## 2018-07-10 DIAGNOSIS — M25511 Pain in right shoulder: Secondary | ICD-10-CM | POA: Diagnosis not present

## 2018-07-10 DIAGNOSIS — M75121 Complete rotator cuff tear or rupture of right shoulder, not specified as traumatic: Secondary | ICD-10-CM | POA: Diagnosis not present

## 2018-07-10 DIAGNOSIS — M6281 Muscle weakness (generalized): Secondary | ICD-10-CM | POA: Diagnosis not present

## 2018-07-10 DIAGNOSIS — M25611 Stiffness of right shoulder, not elsewhere classified: Secondary | ICD-10-CM | POA: Diagnosis not present

## 2018-07-14 DIAGNOSIS — M6281 Muscle weakness (generalized): Secondary | ICD-10-CM | POA: Diagnosis not present

## 2018-07-14 DIAGNOSIS — M25511 Pain in right shoulder: Secondary | ICD-10-CM | POA: Diagnosis not present

## 2018-07-14 DIAGNOSIS — M75121 Complete rotator cuff tear or rupture of right shoulder, not specified as traumatic: Secondary | ICD-10-CM | POA: Diagnosis not present

## 2018-07-14 DIAGNOSIS — M25611 Stiffness of right shoulder, not elsewhere classified: Secondary | ICD-10-CM | POA: Diagnosis not present

## 2018-07-17 DIAGNOSIS — M75121 Complete rotator cuff tear or rupture of right shoulder, not specified as traumatic: Secondary | ICD-10-CM | POA: Diagnosis not present

## 2018-07-17 DIAGNOSIS — M6281 Muscle weakness (generalized): Secondary | ICD-10-CM | POA: Diagnosis not present

## 2018-07-17 DIAGNOSIS — M25511 Pain in right shoulder: Secondary | ICD-10-CM | POA: Diagnosis not present

## 2018-07-17 DIAGNOSIS — M25611 Stiffness of right shoulder, not elsewhere classified: Secondary | ICD-10-CM | POA: Diagnosis not present

## 2018-07-21 DIAGNOSIS — M25611 Stiffness of right shoulder, not elsewhere classified: Secondary | ICD-10-CM | POA: Diagnosis not present

## 2018-07-21 DIAGNOSIS — M75121 Complete rotator cuff tear or rupture of right shoulder, not specified as traumatic: Secondary | ICD-10-CM | POA: Diagnosis not present

## 2018-07-21 DIAGNOSIS — M6281 Muscle weakness (generalized): Secondary | ICD-10-CM | POA: Diagnosis not present

## 2018-07-21 DIAGNOSIS — M25511 Pain in right shoulder: Secondary | ICD-10-CM | POA: Diagnosis not present

## 2018-07-24 DIAGNOSIS — M25511 Pain in right shoulder: Secondary | ICD-10-CM | POA: Diagnosis not present

## 2018-07-24 DIAGNOSIS — M25611 Stiffness of right shoulder, not elsewhere classified: Secondary | ICD-10-CM | POA: Diagnosis not present

## 2018-07-24 DIAGNOSIS — M75121 Complete rotator cuff tear or rupture of right shoulder, not specified as traumatic: Secondary | ICD-10-CM | POA: Diagnosis not present

## 2018-07-24 DIAGNOSIS — M6281 Muscle weakness (generalized): Secondary | ICD-10-CM | POA: Diagnosis not present

## 2018-07-28 DIAGNOSIS — M25511 Pain in right shoulder: Secondary | ICD-10-CM | POA: Diagnosis not present

## 2018-07-28 DIAGNOSIS — M25611 Stiffness of right shoulder, not elsewhere classified: Secondary | ICD-10-CM | POA: Diagnosis not present

## 2018-07-28 DIAGNOSIS — M75121 Complete rotator cuff tear or rupture of right shoulder, not specified as traumatic: Secondary | ICD-10-CM | POA: Diagnosis not present

## 2018-07-28 DIAGNOSIS — M6281 Muscle weakness (generalized): Secondary | ICD-10-CM | POA: Diagnosis not present

## 2018-07-31 DIAGNOSIS — M6281 Muscle weakness (generalized): Secondary | ICD-10-CM | POA: Diagnosis not present

## 2018-07-31 DIAGNOSIS — M25511 Pain in right shoulder: Secondary | ICD-10-CM | POA: Diagnosis not present

## 2018-07-31 DIAGNOSIS — M25611 Stiffness of right shoulder, not elsewhere classified: Secondary | ICD-10-CM | POA: Diagnosis not present

## 2018-07-31 DIAGNOSIS — M75121 Complete rotator cuff tear or rupture of right shoulder, not specified as traumatic: Secondary | ICD-10-CM | POA: Diagnosis not present

## 2018-08-01 DIAGNOSIS — M25511 Pain in right shoulder: Secondary | ICD-10-CM | POA: Diagnosis not present

## 2018-08-04 DIAGNOSIS — M75121 Complete rotator cuff tear or rupture of right shoulder, not specified as traumatic: Secondary | ICD-10-CM | POA: Diagnosis not present

## 2018-08-04 DIAGNOSIS — M25511 Pain in right shoulder: Secondary | ICD-10-CM | POA: Diagnosis not present

## 2018-08-04 DIAGNOSIS — M6281 Muscle weakness (generalized): Secondary | ICD-10-CM | POA: Diagnosis not present

## 2018-08-04 DIAGNOSIS — M25611 Stiffness of right shoulder, not elsewhere classified: Secondary | ICD-10-CM | POA: Diagnosis not present

## 2018-08-05 ENCOUNTER — Other Ambulatory Visit: Payer: Self-pay | Admitting: Family Medicine

## 2018-08-05 ENCOUNTER — Ambulatory Visit (INDEPENDENT_AMBULATORY_CARE_PROVIDER_SITE_OTHER): Payer: Medicare Other | Admitting: Family Medicine

## 2018-08-05 ENCOUNTER — Encounter: Payer: Self-pay | Admitting: Family Medicine

## 2018-08-05 VITALS — Temp 96.4°F | Ht 62.0 in | Wt 155.0 lb

## 2018-08-05 DIAGNOSIS — F331 Major depressive disorder, recurrent, moderate: Secondary | ICD-10-CM

## 2018-08-05 DIAGNOSIS — I10 Essential (primary) hypertension: Secondary | ICD-10-CM | POA: Diagnosis not present

## 2018-08-05 DIAGNOSIS — E119 Type 2 diabetes mellitus without complications: Secondary | ICD-10-CM

## 2018-08-05 DIAGNOSIS — G4700 Insomnia, unspecified: Secondary | ICD-10-CM | POA: Diagnosis not present

## 2018-08-05 DIAGNOSIS — N183 Chronic kidney disease, stage 3 unspecified: Secondary | ICD-10-CM

## 2018-08-05 MED ORDER — ESZOPICLONE 3 MG PO TABS: 3.0000 mg | ORAL_TABLET | Freq: Every evening | ORAL | 0 refills | Status: DC | PRN

## 2018-08-05 NOTE — Progress Notes (Signed)
Virtual Visit via Telephone Note  I connected with Bianca Mckee on 08/05/18 at  3:00 PM EDT by a telephone enabled telemedicine application and verified that I am speaking with the correct person using two identifiers.   I discussed the limitations of evaluation and management by telemedicine and the availability of in person appointments. The patient expressed understanding and agreed to proceed.  Subjective:    CC: DM and BP check   HPI:  Diabetes - no hypoglycemic events. No wounds or sores that are not healing well. No increased thirst or urination. Checking glucose at home. Running a little high: 150 was the highest. Taking medications as prescribed without any side effects.  Hypertension- Pt denies chest pain, SOB, dizziness, or heart palpitations.  Taking meds as directed w/o problems.  Denies medication side effects.    F/U renal function - no recent changes in urination.   F/U Insomnia - she uses Ambien 5mg  prn. Says sometime it doesn't work.  Says she went back to taking the doxepin and helps some but not always.   Usually has a hard time falling sleep.  Sometime the doxepin works.  She does some deep breathing techniques and sometimes will use the ocean sounds.has been going to bed without caffeine.  Has cut out caffeine at night.   F/U depression - Currently on Zoloft 50 mg daily and doing well. She feels like hasn't had any major issues. Uses Xanax PRN.     She went back to work in April after her shoulder surgery. She has been doing well. No recent URI symptoms. She has been doing her PT.  Past medical history, Surgical history, Family history not pertinant except as noted below, Social history, Allergies, and medications have been entered into the medical record, reviewed, and corrections made.   Review of Systems: No fevers, chills, night sweats, weight loss, chest pain, or shortness of breath.   Objective:    General: Speaking clearly in complete sentences without  any shortness of breath.  Alert and oriented x3.  Normal judgment. No apparent acute distress.    Impression and Recommendations:   DM - due for A1C.  Will go to the lab.  Plan to f/U in 4 months.    HTN - asymptomatic.  She is doing well.   CKD 3 - due recheck renal function.   Insomnia  - will try lunesta instead of ambien.  She will let me know.  We could always consider Seroquel at bedtime but it would be off label.    MDD - PHQ -9 score of 4 toay. Continue current regimen.    Time spent in non-face - to face - encounter 25 minutes    I discussed the assessment and treatment plan with the patient. The patient was provided an opportunity to ask questions and all were answered. The patient agreed with the plan and demonstrated an understanding of the instructions.   The patient was advised to call back or seek an in-person evaluation if the symptoms worsen or if the condition fails to improve as anticipated.   Beatrice Lecher, MD

## 2018-08-06 DIAGNOSIS — N183 Chronic kidney disease, stage 3 (moderate): Secondary | ICD-10-CM | POA: Diagnosis not present

## 2018-08-06 DIAGNOSIS — E119 Type 2 diabetes mellitus without complications: Secondary | ICD-10-CM | POA: Diagnosis not present

## 2018-08-06 DIAGNOSIS — I10 Essential (primary) hypertension: Secondary | ICD-10-CM | POA: Diagnosis not present

## 2018-08-06 NOTE — Telephone Encounter (Signed)
Looks like this will need to be switched? Maryruth Eve, Lahoma Crocker, CMA

## 2018-08-07 ENCOUNTER — Ambulatory Visit: Payer: Commercial Managed Care - PPO | Admitting: Family Medicine

## 2018-08-07 DIAGNOSIS — M25511 Pain in right shoulder: Secondary | ICD-10-CM | POA: Diagnosis not present

## 2018-08-07 DIAGNOSIS — M25611 Stiffness of right shoulder, not elsewhere classified: Secondary | ICD-10-CM | POA: Diagnosis not present

## 2018-08-07 DIAGNOSIS — M6281 Muscle weakness (generalized): Secondary | ICD-10-CM | POA: Diagnosis not present

## 2018-08-07 DIAGNOSIS — M75121 Complete rotator cuff tear or rupture of right shoulder, not specified as traumatic: Secondary | ICD-10-CM | POA: Diagnosis not present

## 2018-08-07 LAB — COMPLETE METABOLIC PANEL WITH GFR
AG Ratio: 1.6 (calc) (ref 1.0–2.5)
ALT: 8 U/L (ref 6–29)
AST: 12 U/L (ref 10–35)
Albumin: 4.1 g/dL (ref 3.6–5.1)
Alkaline phosphatase (APISO): 84 U/L (ref 37–153)
BUN/Creatinine Ratio: 21 (calc) (ref 6–22)
BUN: 25 mg/dL (ref 7–25)
CO2: 26 mmol/L (ref 20–32)
Calcium: 9.2 mg/dL (ref 8.6–10.4)
Chloride: 107 mmol/L (ref 98–110)
Creat: 1.19 mg/dL — ABNORMAL HIGH (ref 0.50–0.99)
GFR, Est African American: 55 mL/min/{1.73_m2} — ABNORMAL LOW (ref >=60)
GFR, Est Non African American: 48 mL/min/{1.73_m2} — ABNORMAL LOW (ref >=60)
Globulin: 2.6 g/dL (calc) (ref 1.9–3.7)
Glucose, Bld: 126 mg/dL — ABNORMAL HIGH (ref 65–99)
Potassium: 4.4 mmol/L (ref 3.5–5.3)
Sodium: 142 mmol/L (ref 135–146)
Total Bilirubin: 0.4 mg/dL (ref 0.2–1.2)
Total Protein: 6.7 g/dL (ref 6.1–8.1)

## 2018-08-07 LAB — LIPID PANEL
Cholesterol: 236 mg/dL — ABNORMAL HIGH (ref <200)
HDL: 38 mg/dL — ABNORMAL LOW (ref >=50)
LDL Cholesterol (Calc): 163 mg/dL (calc) — ABNORMAL HIGH
Non-HDL Cholesterol (Calc): 198 mg/dL (calc) — ABNORMAL HIGH (ref <130)
Total CHOL/HDL Ratio: 6.2 (calc) — ABNORMAL HIGH (ref <5.0)
Triglycerides: 195 mg/dL — ABNORMAL HIGH (ref <150)

## 2018-08-07 LAB — HEMOGLOBIN A1C
Hgb A1c MFr Bld: 6.5 % of total Hgb — ABNORMAL HIGH (ref <5.7)
Mean Plasma Glucose: 140 (calc)
eAG (mmol/L): 7.7 (calc)

## 2018-08-11 ENCOUNTER — Encounter: Payer: Self-pay | Admitting: Family Medicine

## 2018-08-12 ENCOUNTER — Other Ambulatory Visit: Payer: Self-pay | Admitting: Family Medicine

## 2018-08-12 MED ORDER — ESZOPICLONE 2 MG PO TABS: 2.0000 mg | ORAL_TABLET | Freq: Every evening | ORAL | 1 refills | Status: DC | PRN

## 2018-08-13 DIAGNOSIS — M25611 Stiffness of right shoulder, not elsewhere classified: Secondary | ICD-10-CM | POA: Diagnosis not present

## 2018-08-13 DIAGNOSIS — M6281 Muscle weakness (generalized): Secondary | ICD-10-CM | POA: Diagnosis not present

## 2018-08-13 DIAGNOSIS — M25511 Pain in right shoulder: Secondary | ICD-10-CM | POA: Diagnosis not present

## 2018-08-13 DIAGNOSIS — M75121 Complete rotator cuff tear or rupture of right shoulder, not specified as traumatic: Secondary | ICD-10-CM | POA: Diagnosis not present

## 2018-08-13 MED ORDER — SUVOREXANT 10 MG PO TABS: 10.0000 mg | ORAL_TABLET | Freq: Every day | ORAL | 0 refills | Status: DC

## 2018-08-13 NOTE — Addendum Note (Signed)
Addended by: Beatrice Lecher D on: 08/13/2018 09:42 AM   Modules accepted: Orders

## 2018-08-13 NOTE — Addendum Note (Signed)
Addended by: Beatrice Lecher D on: 08/13/2018 09:09 AM   Modules accepted: Orders

## 2018-08-14 DIAGNOSIS — M25611 Stiffness of right shoulder, not elsewhere classified: Secondary | ICD-10-CM | POA: Diagnosis not present

## 2018-08-14 DIAGNOSIS — M25511 Pain in right shoulder: Secondary | ICD-10-CM | POA: Diagnosis not present

## 2018-08-14 DIAGNOSIS — M75121 Complete rotator cuff tear or rupture of right shoulder, not specified as traumatic: Secondary | ICD-10-CM | POA: Diagnosis not present

## 2018-08-14 DIAGNOSIS — M6281 Muscle weakness (generalized): Secondary | ICD-10-CM | POA: Diagnosis not present

## 2018-08-18 ENCOUNTER — Encounter: Payer: Self-pay | Admitting: Family Medicine

## 2018-08-18 DIAGNOSIS — M6281 Muscle weakness (generalized): Secondary | ICD-10-CM | POA: Diagnosis not present

## 2018-08-18 DIAGNOSIS — M25611 Stiffness of right shoulder, not elsewhere classified: Secondary | ICD-10-CM | POA: Diagnosis not present

## 2018-08-18 DIAGNOSIS — M25511 Pain in right shoulder: Secondary | ICD-10-CM | POA: Diagnosis not present

## 2018-08-18 DIAGNOSIS — M75121 Complete rotator cuff tear or rupture of right shoulder, not specified as traumatic: Secondary | ICD-10-CM | POA: Diagnosis not present

## 2018-08-19 ENCOUNTER — Ambulatory Visit (INDEPENDENT_AMBULATORY_CARE_PROVIDER_SITE_OTHER): Payer: Medicare Other | Admitting: Family Medicine

## 2018-08-19 ENCOUNTER — Encounter: Payer: Self-pay | Admitting: Family Medicine

## 2018-08-19 VITALS — Temp 97.1°F | Ht 62.0 in | Wt 150.0 lb

## 2018-08-19 DIAGNOSIS — G4700 Insomnia, unspecified: Secondary | ICD-10-CM

## 2018-08-19 DIAGNOSIS — S00531A Contusion of lip, initial encounter: Secondary | ICD-10-CM | POA: Diagnosis not present

## 2018-08-19 DIAGNOSIS — T50905A Adverse effect of unspecified drugs, medicaments and biological substances, initial encounter: Secondary | ICD-10-CM

## 2018-08-19 MED ORDER — ESZOPICLONE 3 MG PO TABS: 3.0000 mg | ORAL_TABLET | Freq: Every day | ORAL | 2 refills | Status: DC

## 2018-08-19 NOTE — Progress Notes (Signed)
Last Monday took an Ambien  woke up between her her bed and dresser.  Bruise on head, L and R buttocks . She did not p/u the other medications due to cost. And had only taken the Ambien.Marland KitchenMarland KitchenElouise Munroe, CMA]

## 2018-08-19 NOTE — Progress Notes (Signed)
Virtual Visit via Telephone Note  I connected with Bianca Mckee on 08/19/18 at 10:10 AM EDT by telephone and verified that I am speaking with the correct person using two identifiers.   I discussed the limitations, risks, security and privacy concerns of performing an evaluation and management service by telephone and the availability of in person appointments. I also discussed with the patient that there may be a patient responsible charge related to this service. The patient expressed understanding and agreed to proceed.  Pt was at home and I was in my office for the virtual visit.     Subjective:    CC: side effects of sleep medicine.   HPI:  About 8 days ago she took her Ambien like she normally does and remembers having a dream that she couldn't get out of the floor and had fallen but then when woke up realized she had fallen at some point in the middle of the night and injured her head and lip. They were bruised and swollen over her left eye and lip.  She thinks she must have drank some alcohol in the middle of the night.  She also had some bruise on her bottom.  She thinks she may have taken some doxepin. Maybe 4 tabs.     Past medical history, Surgical history, Family history not pertinant except as noted below, Social history, Allergies, and medications have been entered into the medical record, reviewed, and corrections made.   Review of Systems: No fevers, chills, night sweats, weight loss, chest pain, or shortness of breath.   Objective:    General: Speaking clearly in complete sentences without any shortness of breath.  Alert and oriented x3.  Normal judgment. No apparent acute distress.    Impression and Recommendations:   Insomnia - stop the Ambien. Throw in trash .  Will try the Lunesta. The 3 mg is cheaper at [facility].    Medication side effect - stop the Ambien immediately. Added intolerance list. Don't every take again.   Contusions - they are healing.    I  discussed the assessment and treatment plan with the patient. The patient was provided an opportunity to ask questions and all were answered. The patient agreed with the plan and demonstrated an understanding of the instructions.   The patient was advised to call back or seek an in-person evaluation if the symptoms worsen or if the condition fails to improve as anticipated.  I provided 20 minutes of non-face-to-face time during this encounter.   Beatrice Lecher, MD

## 2018-08-21 DIAGNOSIS — M25511 Pain in right shoulder: Secondary | ICD-10-CM | POA: Diagnosis not present

## 2018-08-21 DIAGNOSIS — M25611 Stiffness of right shoulder, not elsewhere classified: Secondary | ICD-10-CM | POA: Diagnosis not present

## 2018-08-21 DIAGNOSIS — M6281 Muscle weakness (generalized): Secondary | ICD-10-CM | POA: Diagnosis not present

## 2018-08-21 DIAGNOSIS — M75121 Complete rotator cuff tear or rupture of right shoulder, not specified as traumatic: Secondary | ICD-10-CM | POA: Diagnosis not present

## 2018-08-25 DIAGNOSIS — M6281 Muscle weakness (generalized): Secondary | ICD-10-CM | POA: Diagnosis not present

## 2018-08-25 DIAGNOSIS — M25511 Pain in right shoulder: Secondary | ICD-10-CM | POA: Diagnosis not present

## 2018-08-25 DIAGNOSIS — M75121 Complete rotator cuff tear or rupture of right shoulder, not specified as traumatic: Secondary | ICD-10-CM | POA: Diagnosis not present

## 2018-08-25 DIAGNOSIS — M25611 Stiffness of right shoulder, not elsewhere classified: Secondary | ICD-10-CM | POA: Diagnosis not present

## 2018-08-27 ENCOUNTER — Other Ambulatory Visit: Payer: Self-pay | Admitting: Family Medicine

## 2018-08-28 DIAGNOSIS — M6281 Muscle weakness (generalized): Secondary | ICD-10-CM | POA: Diagnosis not present

## 2018-08-28 DIAGNOSIS — M25611 Stiffness of right shoulder, not elsewhere classified: Secondary | ICD-10-CM | POA: Diagnosis not present

## 2018-08-28 DIAGNOSIS — M75121 Complete rotator cuff tear or rupture of right shoulder, not specified as traumatic: Secondary | ICD-10-CM | POA: Diagnosis not present

## 2018-08-28 DIAGNOSIS — M25511 Pain in right shoulder: Secondary | ICD-10-CM | POA: Diagnosis not present

## 2018-09-01 DIAGNOSIS — M75121 Complete rotator cuff tear or rupture of right shoulder, not specified as traumatic: Secondary | ICD-10-CM | POA: Diagnosis not present

## 2018-09-01 DIAGNOSIS — M25511 Pain in right shoulder: Secondary | ICD-10-CM | POA: Diagnosis not present

## 2018-09-01 DIAGNOSIS — M25611 Stiffness of right shoulder, not elsewhere classified: Secondary | ICD-10-CM | POA: Diagnosis not present

## 2018-09-01 DIAGNOSIS — M6281 Muscle weakness (generalized): Secondary | ICD-10-CM | POA: Diagnosis not present

## 2018-09-03 ENCOUNTER — Other Ambulatory Visit: Payer: Self-pay | Admitting: Family Medicine

## 2018-09-03 DIAGNOSIS — F331 Major depressive disorder, recurrent, moderate: Secondary | ICD-10-CM

## 2018-09-04 DIAGNOSIS — M25511 Pain in right shoulder: Secondary | ICD-10-CM | POA: Diagnosis not present

## 2018-09-04 DIAGNOSIS — M6281 Muscle weakness (generalized): Secondary | ICD-10-CM | POA: Diagnosis not present

## 2018-09-04 DIAGNOSIS — M25611 Stiffness of right shoulder, not elsewhere classified: Secondary | ICD-10-CM | POA: Diagnosis not present

## 2018-09-04 DIAGNOSIS — M75121 Complete rotator cuff tear or rupture of right shoulder, not specified as traumatic: Secondary | ICD-10-CM | POA: Diagnosis not present

## 2018-09-10 DIAGNOSIS — M6281 Muscle weakness (generalized): Secondary | ICD-10-CM | POA: Diagnosis not present

## 2018-09-10 DIAGNOSIS — M25511 Pain in right shoulder: Secondary | ICD-10-CM | POA: Diagnosis not present

## 2018-09-10 DIAGNOSIS — M75121 Complete rotator cuff tear or rupture of right shoulder, not specified as traumatic: Secondary | ICD-10-CM | POA: Diagnosis not present

## 2018-09-10 DIAGNOSIS — M25611 Stiffness of right shoulder, not elsewhere classified: Secondary | ICD-10-CM | POA: Diagnosis not present

## 2018-09-11 DIAGNOSIS — M25511 Pain in right shoulder: Secondary | ICD-10-CM | POA: Diagnosis not present

## 2018-09-11 DIAGNOSIS — M75121 Complete rotator cuff tear or rupture of right shoulder, not specified as traumatic: Secondary | ICD-10-CM | POA: Diagnosis not present

## 2018-09-11 DIAGNOSIS — M25611 Stiffness of right shoulder, not elsewhere classified: Secondary | ICD-10-CM | POA: Diagnosis not present

## 2018-09-11 DIAGNOSIS — M6281 Muscle weakness (generalized): Secondary | ICD-10-CM | POA: Diagnosis not present

## 2018-09-15 DIAGNOSIS — M75121 Complete rotator cuff tear or rupture of right shoulder, not specified as traumatic: Secondary | ICD-10-CM | POA: Diagnosis not present

## 2018-09-15 DIAGNOSIS — M6281 Muscle weakness (generalized): Secondary | ICD-10-CM | POA: Diagnosis not present

## 2018-09-15 DIAGNOSIS — M25511 Pain in right shoulder: Secondary | ICD-10-CM | POA: Diagnosis not present

## 2018-09-15 DIAGNOSIS — M25611 Stiffness of right shoulder, not elsewhere classified: Secondary | ICD-10-CM | POA: Diagnosis not present

## 2018-09-16 ENCOUNTER — Ambulatory Visit (INDEPENDENT_AMBULATORY_CARE_PROVIDER_SITE_OTHER): Payer: Medicare Other | Admitting: Family Medicine

## 2018-09-16 ENCOUNTER — Encounter: Payer: Self-pay | Admitting: Family Medicine

## 2018-09-16 VITALS — BP 116/68 | HR 95 | Ht 62.0 in | Wt 147.0 lb

## 2018-09-16 DIAGNOSIS — G4700 Insomnia, unspecified: Secondary | ICD-10-CM

## 2018-09-16 DIAGNOSIS — L84 Corns and callosities: Secondary | ICD-10-CM

## 2018-09-16 NOTE — Assessment & Plan Note (Signed)
For right now continue with the trial of the over-the-counter supplement.  If not helpful then we could consider Belsomra but it may be cost prohibitive or even trying a mood medication such as a low-dose of Seroquel instead.

## 2018-09-16 NOTE — Progress Notes (Signed)
Acute Office Visit  Subjective:    Patient ID: Bianca Mckee, female    DOB: 1950-06-11, 68 y.o.   MRN: [id number]  Chief Complaint  Patient presents with  . Callouses    on L foot she attempted to remove herself     HPI Patient is in today for a callus on her left outer foot.  She says it is been there for several weeks.  About a week ago she was soaking it in vinegar and then peeled it off herself.  She says it felt better for a few days but now feels like it is coming back she even tried some corn and callus remover as well.  It is just getting tender and sore in her shoe.   He also wanted to discuss her sleep medication.  She had tried Ambien previously and did some sleepwalking and actually fell and injured herself.  More recently we tried to put her on Lunesta.  She tried the 3 mg dose and said that some nights it did not seem to work very well but then she found herself texting in the middle the night and not realizing it.  So she is now stop the medication.  She is picked up an over-the-counter supplement called Sleep Aid 3, she is not sure of the exact ingredients but says it does have melatonin in it.  As it did seem to help last night but she is not sure if that effect will be lasting or not.  Past Medical History:  Diagnosis Date  . Anxiety   . GERD (gastroesophageal reflux disease)   . Hypertension 1996  . Vaginal Pap smear, abnormal     Past Surgical History:  Procedure Laterality Date  . CERVIX LESION DESTRUCTION    . TUBAL LIGATION      Family History  Problem Relation Age of Onset  . Lung cancer Brother 9       smoker  . Pancreatic cancer Brother     Social History   Socioeconomic History  . Marital status: Divorced    Spouse name: Not on file  . Number of children: Not on file  . Years of education: Not on file  . Highest education level: Not on file  Occupational History  . Not on file  Social Needs  . Financial resource strain: Not on file   . Food insecurity    Worry: Not on file    Inability: Not on file  . Transportation needs    Medical: Not on file    Non-medical: Not on file  Tobacco Use  . Smoking status: Never Smoker  . Smokeless tobacco: Never Used  Substance and Sexual Activity  . Alcohol use: No  . Drug use: No  . Sexual activity: Yes    Birth control/protection: None  Lifestyle  . Physical activity    Days per week: Not on file    Minutes per session: Not on file  . Stress: Not on file  Relationships  . Social Herbalist on phone: Not on file    Gets together: Not on file    Attends religious service: Not on file    Active member of club or organization: Not on file    Attends meetings of clubs or organizations: Not on file    Relationship status: Not on file  . Intimate partner violence    Fear of current or ex partner: Not on file    Emotionally abused:  Not on file    Physically abused: Not on file    Forced sexual activity: Not on file  Other Topics Concern  . Not on file  Social History Narrative  . Not on file    Outpatient Medications Prior to Visit  Medication Sig Dispense Refill  . ALPRAZolam (XANAX) 1 MG tablet TAKE 1/2 TO 1 TABLET BY MOUTH DAILY AS NEEDED FOR ANXIETY 30 tablet 3  . AMBULATORY NON FORMULARY MEDICATION Medication Name: strips for One Touch Ultra 2  to test sugar once a day. Dx. Diabetes. E11.9 90 Units PRN  . AMBULATORY NON FORMULARY MEDICATION Medication Name: Sleep Aid 3    . atorvastatin (LIPITOR) 40 MG tablet TAKE 1 TABLET BY MOUTH EVERYDAY AT BEDTIME 90 tablet 1  . buPROPion (WELLBUTRIN XL) 150 MG 24 hr tablet TAKE 1 TABLET BY MOUTH EVERY DAY 90 tablet 1  . hydrOXYzine (ATARAX/VISTARIL) 25 MG tablet Take 1 tablet (25 mg total) by mouth 3 (three) times daily as needed. 30 tablet 0  . linaclotide (LINZESS) 145 MCG CAPS capsule Take 1 capsule (145 mcg total) by mouth daily. 90 capsule 3  . metFORMIN (GLUCOPHAGE) 1000 MG tablet TAKE 1 TABLET (1,000 MG TOTAL)  BY MOUTH 2 (TWO) TIMES DAILY WITH A MEAL. 180 tablet 1  . omeprazole (PRILOSEC) 40 MG capsule TAKE 1 CAPSULE BY MOUTH EVERY DAY 90 capsule 1  . sertraline (ZOLOFT) 50 MG tablet TAKE 1 TABLET (50 MG TOTAL) BY MOUTH DAILY. 90 tablet 1  . telmisartan (MICARDIS) 40 MG tablet TAKE 1 TABLET (40 MG) BY ORAL ROUTE ONCE DAILY  3  . triamterene-hydrochlorothiazide (MAXZIDE) 75-50 MG tablet Take 1 tablet by mouth daily.    Marland Kitchen doxepin (SINEQUAN) 10 MG capsule TAKE 1 CAPSULE (10 MG TOTAL) BY MOUTH AT BEDTIME. 90 capsule 1  . Eszopiclone 3 MG TABS Take 1 tablet (3 mg total) by mouth at bedtime. Take immediately before bedtime 30 tablet 2   No facility-administered medications prior to visit.     Allergies  Allergen Reactions  . Ambien [Zolpidem Tartrate] Other (See Comments)    Sleep walking, injury  . Codeine Phosphate   . Eszopiclone Other (See Comments)    Metallic taste    ROS     Objective:    Physical Exam  Constitutional: She is oriented to person, place, and time. She appears well-developed and well-nourished.  HENT:  Head: Normocephalic and atraumatic.  Eyes: Conjunctivae and EOM are normal.  Cardiovascular: Normal rate.  Pulmonary/Chest: Effort normal.  Neurological: She is alert and oriented to person, place, and time.  Skin: Skin is dry. No pallor.  The outer edge of the left foot she has a thickened area approximately 2 cm in size with a skin is little bit more thickened.  Around the edge of that the skin looks like it has peeled away.  No open wound or ulceration and no drainage.  No significant erythema or fluctuance.  Psychiatric: She has a normal mood and affect. Her behavior is normal.  Vitals reviewed.   BP 116/68   Pulse 95   Ht 5\' 2"  (1.575 m)   Wt 147 lb (66.7 kg)   SpO2 100%   BMI 26.89 kg/m  Wt Readings from Last 3 Encounters:  09/16/18 147 lb (66.7 kg)  08/19/18 150 lb (68 kg)  08/05/18 155 lb (70.3 kg)    Health Maintenance Due  Topic Date Due  . DEXA  SCAN  02/03/2018  . OPHTHALMOLOGY EXAM  07/25/2018  .  FOOT EXAM  09/06/2018    There are no preventive care reminders to display for this patient.   Lab Results  Component Value Date   TSH 1.754 04/17/2013   Lab Results  Component Value Date   WBC 10.5 11/03/2007   HGB 14.0 11/03/2007   HCT 43.6 11/03/2007   MCV 94.8 11/03/2007   PLT 394 11/03/2007   Lab Results  Component Value Date   NA 142 08/06/2018   K 4.4 08/06/2018   CO2 26 08/06/2018   GLUCOSE 126 (H) 08/06/2018   BUN 25 08/06/2018   CREATININE 1.19 (H) 08/06/2018   BILITOT 0.4 08/06/2018   ALKPHOS 71 01/05/2016   AST 12 08/06/2018   ALT 8 08/06/2018   PROT 6.7 08/06/2018   ALBUMIN 4.4 01/05/2016   CALCIUM 9.2 08/06/2018   Lab Results  Component Value Date   CHOL 236 (H) 08/06/2018   Lab Results  Component Value Date   HDL 38 (L) 08/06/2018   Lab Results  Component Value Date   LDLCALC 163 (H) 08/06/2018   Lab Results  Component Value Date   TRIG 195 (H) 08/06/2018   Lab Results  Component Value Date   CHOLHDL 6.2 (H) 08/06/2018   Lab Results  Component Value Date   HGBA1C 6.5 (H) 08/06/2018       Assessment & Plan:   Problem List Items Addressed This Visit      Other   INSOMNIA NOS    For right now continue with the trial of the over-the-counter supplement.  If not helpful then we could consider Belsomra but it may be cost prohibitive or even trying a mood medication such as a low-dose of Seroquel instead.       Other Visit Diagnoses    Corn of foot    -  Primary     Corn of foot-discussed treatment options with debridement and cryotherapy.  Did warn that the foot will be very sore in that area for several days and may need to ice and elevate and even take an anti-inflammatory if needed.  Took a #15 blade and debrided the thick keratotic tissue on the lesion.  Cryotherapy performed. Tolerated procedure well.  Follow-up in 1 month if treatment needs to be applied again.   No  orders of the defined types were placed in this encounter.    Beatrice Lecher, MD

## 2018-09-17 DIAGNOSIS — M25511 Pain in right shoulder: Secondary | ICD-10-CM | POA: Diagnosis not present

## 2018-09-17 DIAGNOSIS — M25611 Stiffness of right shoulder, not elsewhere classified: Secondary | ICD-10-CM | POA: Diagnosis not present

## 2018-09-17 DIAGNOSIS — M75121 Complete rotator cuff tear or rupture of right shoulder, not specified as traumatic: Secondary | ICD-10-CM | POA: Diagnosis not present

## 2018-09-17 DIAGNOSIS — M6281 Muscle weakness (generalized): Secondary | ICD-10-CM | POA: Diagnosis not present

## 2018-09-18 ENCOUNTER — Ambulatory Visit (INDEPENDENT_AMBULATORY_CARE_PROVIDER_SITE_OTHER): Payer: Medicare Other | Admitting: Family Medicine

## 2018-09-18 ENCOUNTER — Other Ambulatory Visit: Payer: Self-pay

## 2018-09-18 ENCOUNTER — Encounter: Payer: Self-pay | Admitting: Family Medicine

## 2018-09-18 ENCOUNTER — Other Ambulatory Visit (HOSPITAL_COMMUNITY)
Admission: RE | Admit: 2018-09-18 | Discharge: 2018-09-18 | Disposition: A | Discharge status: Home / Self Care | Payer: Medicare Other | Source: Ambulatory Visit | Attending: Family Medicine | Admitting: Family Medicine

## 2018-09-18 VITALS — BP 129/80 | HR 95 | Ht 62.0 in | Wt 145.0 lb

## 2018-09-18 DIAGNOSIS — Z1151 Encounter for screening for human papillomavirus (HPV): Secondary | ICD-10-CM | POA: Diagnosis not present

## 2018-09-18 DIAGNOSIS — R87612 Low grade squamous intraepithelial lesion on cytologic smear of cervix (LGSIL): Secondary | ICD-10-CM

## 2018-09-18 DIAGNOSIS — Z124 Encounter for screening for malignant neoplasm of cervix: Secondary | ICD-10-CM

## 2018-09-18 DIAGNOSIS — R87810 Cervical high risk human papillomavirus (HPV) DNA test positive: Secondary | ICD-10-CM | POA: Diagnosis not present

## 2018-09-18 NOTE — Assessment & Plan Note (Signed)
F/u pap today

## 2018-09-18 NOTE — Progress Notes (Signed)
   Subjective:    Patient ID: Bianca Mckee is a 68 y.o. female presenting with Repeat Pap Smear  on 09/18/2018  HPI: Here for f/u pap. H/o HGSIL with LEEP last year, with low grade margins.  Review of Systems  Constitutional: Negative for chills, fever and unexpected weight change.  Respiratory: Negative for shortness of breath.   Cardiovascular: Negative for chest pain.  Gastrointestinal: Negative for abdominal pain.      Objective:    BP 129/80   Pulse 95   Ht 5\' 2"  (1.575 m)   Wt 145 lb (65.8 kg)   BMI 26.52 kg/m  Physical Exam Constitutional:      General: She is not in acute distress.    Appearance: She is well-developed.  Eyes:     General: No scleral icterus. Pulmonary:     Effort: Pulmonary effort is normal.  Genitourinary:    Comments: BUS normal, vagina is pink and rugated, cervix is  without lesion and flat.  Neurological:     Mental Status: She is alert.         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 Over 50% of encounter was spent on counseling and coordination of care. Return in about 6 months (around 03/21/2019).  Donnamae Jude 09/18/2018 5:08 PM

## 2018-09-19 ENCOUNTER — Encounter

## 2018-09-22 ENCOUNTER — Encounter: Payer: Self-pay | Admitting: Family Medicine

## 2018-09-22 DIAGNOSIS — M6281 Muscle weakness (generalized): Secondary | ICD-10-CM | POA: Diagnosis not present

## 2018-09-22 DIAGNOSIS — M75121 Complete rotator cuff tear or rupture of right shoulder, not specified as traumatic: Secondary | ICD-10-CM | POA: Diagnosis not present

## 2018-09-22 DIAGNOSIS — M25511 Pain in right shoulder: Secondary | ICD-10-CM | POA: Diagnosis not present

## 2018-09-22 DIAGNOSIS — M25611 Stiffness of right shoulder, not elsewhere classified: Secondary | ICD-10-CM | POA: Diagnosis not present

## 2018-09-24 ENCOUNTER — Telehealth: Payer: Self-pay | Admitting: *Deleted

## 2018-09-24 LAB — CYTOLOGY - PAP: HPV: DETECTED — AB

## 2018-09-24 NOTE — Telephone Encounter (Signed)
Pt notified of abnormal pap and Colpo scheduled for 10/06/18 @ 3:30 with Dr Hulan Fray.

## 2018-09-25 DIAGNOSIS — R262 Difficulty in walking, not elsewhere classified: Secondary | ICD-10-CM | POA: Diagnosis not present

## 2018-09-25 DIAGNOSIS — M6281 Muscle weakness (generalized): Secondary | ICD-10-CM | POA: Diagnosis not present

## 2018-09-25 DIAGNOSIS — M25562 Pain in left knee: Secondary | ICD-10-CM | POA: Diagnosis not present

## 2018-09-25 DIAGNOSIS — M545 Low back pain: Secondary | ICD-10-CM | POA: Diagnosis not present

## 2018-09-25 DIAGNOSIS — M25561 Pain in right knee: Secondary | ICD-10-CM | POA: Diagnosis not present

## 2018-09-30 DIAGNOSIS — M25562 Pain in left knee: Secondary | ICD-10-CM | POA: Diagnosis not present

## 2018-09-30 DIAGNOSIS — M6281 Muscle weakness (generalized): Secondary | ICD-10-CM | POA: Diagnosis not present

## 2018-09-30 DIAGNOSIS — M545 Low back pain: Secondary | ICD-10-CM | POA: Diagnosis not present

## 2018-09-30 DIAGNOSIS — R262 Difficulty in walking, not elsewhere classified: Secondary | ICD-10-CM | POA: Diagnosis not present

## 2018-09-30 DIAGNOSIS — M25561 Pain in right knee: Secondary | ICD-10-CM | POA: Diagnosis not present

## 2018-10-01 DIAGNOSIS — M25511 Pain in right shoulder: Secondary | ICD-10-CM | POA: Diagnosis not present

## 2018-10-02 DIAGNOSIS — M545 Low back pain: Secondary | ICD-10-CM | POA: Diagnosis not present

## 2018-10-02 DIAGNOSIS — M25561 Pain in right knee: Secondary | ICD-10-CM | POA: Diagnosis not present

## 2018-10-02 DIAGNOSIS — M25562 Pain in left knee: Secondary | ICD-10-CM | POA: Diagnosis not present

## 2018-10-02 DIAGNOSIS — M6281 Muscle weakness (generalized): Secondary | ICD-10-CM | POA: Diagnosis not present

## 2018-10-02 DIAGNOSIS — R262 Difficulty in walking, not elsewhere classified: Secondary | ICD-10-CM | POA: Diagnosis not present

## 2018-10-03 ENCOUNTER — Encounter: Payer: Self-pay | Admitting: Family Medicine

## 2018-10-06 ENCOUNTER — Encounter: Payer: Self-pay | Admitting: Obstetrics & Gynecology

## 2018-10-06 ENCOUNTER — Ambulatory Visit: Payer: Medicare Other | Admitting: Obstetrics & Gynecology

## 2018-10-06 ENCOUNTER — Other Ambulatory Visit: Payer: Self-pay

## 2018-10-06 VITALS — BP 103/70 | HR 87 | Resp 16 | Ht 62.0 in | Wt 145.0 lb

## 2018-10-06 DIAGNOSIS — R87612 Low grade squamous intraepithelial lesion on cytologic smear of cervix (LGSIL): Secondary | ICD-10-CM | POA: Diagnosis not present

## 2018-10-06 NOTE — Progress Notes (Signed)
   Subjective:    Patient ID: Bianca Mckee, female    DOB: 1951-03-18, 68 y.o.   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   Review of Systems     Objective:   Physical Exam Breathing, conversing, and ambulating normally Well nourished, well hydrated White female, no apparent distress UPT negative, consent signed, time out done Cervix prepped with acetic acid. Her cervix was flush with the vagina, no definable os Atrophy was noted. There was no specific abnormalities but the colpo was definitely inadequate. She tolerated the procedure well.     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 I rec'd a referral to a gyn onc for further evaluation and recommendations. She prefers to go to [place] in [place] as she lives and works there.

## 2018-10-08 DIAGNOSIS — M25562 Pain in left knee: Secondary | ICD-10-CM | POA: Diagnosis not present

## 2018-10-08 DIAGNOSIS — R262 Difficulty in walking, not elsewhere classified: Secondary | ICD-10-CM | POA: Diagnosis not present

## 2018-10-08 DIAGNOSIS — M6281 Muscle weakness (generalized): Secondary | ICD-10-CM | POA: Diagnosis not present

## 2018-10-08 DIAGNOSIS — M25561 Pain in right knee: Secondary | ICD-10-CM | POA: Diagnosis not present

## 2018-10-08 DIAGNOSIS — M545 Low back pain: Secondary | ICD-10-CM | POA: Diagnosis not present

## 2018-10-09 DIAGNOSIS — H524 Presbyopia: Secondary | ICD-10-CM | POA: Diagnosis not present

## 2018-10-09 DIAGNOSIS — H52223 Regular astigmatism, bilateral: Secondary | ICD-10-CM | POA: Diagnosis not present

## 2018-10-09 DIAGNOSIS — H2513 Age-related nuclear cataract, bilateral: Secondary | ICD-10-CM | POA: Diagnosis not present

## 2018-10-09 DIAGNOSIS — H5203 Hypermetropia, bilateral: Secondary | ICD-10-CM | POA: Diagnosis not present

## 2018-10-09 DIAGNOSIS — E119 Type 2 diabetes mellitus without complications: Secondary | ICD-10-CM | POA: Diagnosis not present

## 2018-10-10 ENCOUNTER — Encounter: Payer: Self-pay | Admitting: Family Medicine

## 2018-10-10 MED ORDER — SERTRALINE HCL 100 MG PO TABS: 100.0000 mg | ORAL_TABLET | Freq: Every day | ORAL | 0 refills | Status: DC

## 2018-10-13 DIAGNOSIS — N882 Stricture and stenosis of cervix uteri: Secondary | ICD-10-CM | POA: Diagnosis not present

## 2018-10-13 DIAGNOSIS — R87612 Low grade squamous intraepithelial lesion on cytologic smear of cervix (LGSIL): Secondary | ICD-10-CM | POA: Diagnosis not present

## 2018-10-14 DIAGNOSIS — R262 Difficulty in walking, not elsewhere classified: Secondary | ICD-10-CM | POA: Diagnosis not present

## 2018-10-14 DIAGNOSIS — M545 Low back pain: Secondary | ICD-10-CM | POA: Diagnosis not present

## 2018-10-14 DIAGNOSIS — M25562 Pain in left knee: Secondary | ICD-10-CM | POA: Diagnosis not present

## 2018-10-14 DIAGNOSIS — M6281 Muscle weakness (generalized): Secondary | ICD-10-CM | POA: Diagnosis not present

## 2018-10-14 DIAGNOSIS — M25561 Pain in right knee: Secondary | ICD-10-CM | POA: Diagnosis not present

## 2018-10-15 DIAGNOSIS — N882 Stricture and stenosis of cervix uteri: Secondary | ICD-10-CM | POA: Diagnosis not present

## 2018-10-15 DIAGNOSIS — R87612 Low grade squamous intraepithelial lesion on cytologic smear of cervix (LGSIL): Secondary | ICD-10-CM | POA: Diagnosis not present

## 2018-10-16 DIAGNOSIS — M25562 Pain in left knee: Secondary | ICD-10-CM | POA: Diagnosis not present

## 2018-10-16 DIAGNOSIS — M25561 Pain in right knee: Secondary | ICD-10-CM | POA: Diagnosis not present

## 2018-10-16 DIAGNOSIS — M6281 Muscle weakness (generalized): Secondary | ICD-10-CM | POA: Diagnosis not present

## 2018-10-16 DIAGNOSIS — M545 Low back pain: Secondary | ICD-10-CM | POA: Diagnosis not present

## 2018-10-16 DIAGNOSIS — R262 Difficulty in walking, not elsewhere classified: Secondary | ICD-10-CM | POA: Diagnosis not present

## 2018-10-20 DIAGNOSIS — M25562 Pain in left knee: Secondary | ICD-10-CM | POA: Diagnosis not present

## 2018-10-20 DIAGNOSIS — M25561 Pain in right knee: Secondary | ICD-10-CM | POA: Diagnosis not present

## 2018-10-20 DIAGNOSIS — R262 Difficulty in walking, not elsewhere classified: Secondary | ICD-10-CM | POA: Diagnosis not present

## 2018-10-20 DIAGNOSIS — M545 Low back pain: Secondary | ICD-10-CM | POA: Diagnosis not present

## 2018-10-20 DIAGNOSIS — M6281 Muscle weakness (generalized): Secondary | ICD-10-CM | POA: Diagnosis not present

## 2018-10-22 DIAGNOSIS — R262 Difficulty in walking, not elsewhere classified: Secondary | ICD-10-CM | POA: Diagnosis not present

## 2018-10-22 DIAGNOSIS — M25561 Pain in right knee: Secondary | ICD-10-CM | POA: Diagnosis not present

## 2018-10-22 DIAGNOSIS — M6281 Muscle weakness (generalized): Secondary | ICD-10-CM | POA: Diagnosis not present

## 2018-10-22 DIAGNOSIS — M25562 Pain in left knee: Secondary | ICD-10-CM | POA: Diagnosis not present

## 2018-10-22 DIAGNOSIS — M545 Low back pain: Secondary | ICD-10-CM | POA: Diagnosis not present

## 2018-10-23 DIAGNOSIS — M25511 Pain in right shoulder: Secondary | ICD-10-CM | POA: Diagnosis not present

## 2018-10-27 DIAGNOSIS — R87612 Low grade squamous intraepithelial lesion on cytologic smear of cervix (LGSIL): Secondary | ICD-10-CM | POA: Diagnosis not present

## 2018-10-27 DIAGNOSIS — N882 Stricture and stenosis of cervix uteri: Secondary | ICD-10-CM | POA: Diagnosis not present

## 2018-10-28 DIAGNOSIS — M545 Low back pain: Secondary | ICD-10-CM | POA: Diagnosis not present

## 2018-10-28 DIAGNOSIS — R262 Difficulty in walking, not elsewhere classified: Secondary | ICD-10-CM | POA: Diagnosis not present

## 2018-10-28 DIAGNOSIS — M6281 Muscle weakness (generalized): Secondary | ICD-10-CM | POA: Diagnosis not present

## 2018-10-28 DIAGNOSIS — M25561 Pain in right knee: Secondary | ICD-10-CM | POA: Diagnosis not present

## 2018-10-28 DIAGNOSIS — M25562 Pain in left knee: Secondary | ICD-10-CM | POA: Diagnosis not present

## 2018-10-29 DIAGNOSIS — M6281 Muscle weakness (generalized): Secondary | ICD-10-CM | POA: Diagnosis not present

## 2018-10-29 DIAGNOSIS — M25561 Pain in right knee: Secondary | ICD-10-CM | POA: Diagnosis not present

## 2018-10-29 DIAGNOSIS — M25562 Pain in left knee: Secondary | ICD-10-CM | POA: Diagnosis not present

## 2018-10-29 DIAGNOSIS — M545 Low back pain: Secondary | ICD-10-CM | POA: Diagnosis not present

## 2018-10-29 DIAGNOSIS — R262 Difficulty in walking, not elsewhere classified: Secondary | ICD-10-CM | POA: Diagnosis not present

## 2018-10-30 ENCOUNTER — Other Ambulatory Visit: Payer: Self-pay | Admitting: Family Medicine

## 2018-10-30 ENCOUNTER — Encounter: Payer: Self-pay | Admitting: Family Medicine

## 2018-10-30 DIAGNOSIS — G4700 Insomnia, unspecified: Secondary | ICD-10-CM

## 2018-10-31 ENCOUNTER — Other Ambulatory Visit: Payer: Self-pay | Admitting: Family Medicine

## 2018-10-31 MED ORDER — DOXEPIN HCL 10 MG PO CAPS: 10.0000 mg | ORAL_CAPSULE | Freq: Every day | ORAL | 1 refills | Status: DC

## 2018-11-01 DIAGNOSIS — M25511 Pain in right shoulder: Secondary | ICD-10-CM | POA: Diagnosis not present

## 2018-11-03 DIAGNOSIS — M25561 Pain in right knee: Secondary | ICD-10-CM | POA: Diagnosis not present

## 2018-11-03 DIAGNOSIS — M545 Low back pain: Secondary | ICD-10-CM | POA: Diagnosis not present

## 2018-11-03 DIAGNOSIS — R262 Difficulty in walking, not elsewhere classified: Secondary | ICD-10-CM | POA: Diagnosis not present

## 2018-11-03 DIAGNOSIS — M25562 Pain in left knee: Secondary | ICD-10-CM | POA: Diagnosis not present

## 2018-11-03 DIAGNOSIS — M6281 Muscle weakness (generalized): Secondary | ICD-10-CM | POA: Diagnosis not present

## 2018-11-10 DIAGNOSIS — M25561 Pain in right knee: Secondary | ICD-10-CM | POA: Diagnosis not present

## 2018-11-10 DIAGNOSIS — M545 Low back pain: Secondary | ICD-10-CM | POA: Diagnosis not present

## 2018-11-10 DIAGNOSIS — R262 Difficulty in walking, not elsewhere classified: Secondary | ICD-10-CM | POA: Diagnosis not present

## 2018-11-10 DIAGNOSIS — M25562 Pain in left knee: Secondary | ICD-10-CM | POA: Diagnosis not present

## 2018-11-10 DIAGNOSIS — M6281 Muscle weakness (generalized): Secondary | ICD-10-CM | POA: Diagnosis not present

## 2018-11-12 DIAGNOSIS — M545 Low back pain: Secondary | ICD-10-CM | POA: Diagnosis not present

## 2018-11-12 DIAGNOSIS — M6281 Muscle weakness (generalized): Secondary | ICD-10-CM | POA: Diagnosis not present

## 2018-11-12 DIAGNOSIS — R262 Difficulty in walking, not elsewhere classified: Secondary | ICD-10-CM | POA: Diagnosis not present

## 2018-11-12 DIAGNOSIS — M25561 Pain in right knee: Secondary | ICD-10-CM | POA: Diagnosis not present

## 2018-11-12 DIAGNOSIS — M25562 Pain in left knee: Secondary | ICD-10-CM | POA: Diagnosis not present

## 2018-11-17 DIAGNOSIS — M25561 Pain in right knee: Secondary | ICD-10-CM | POA: Diagnosis not present

## 2018-11-17 DIAGNOSIS — M545 Low back pain: Secondary | ICD-10-CM | POA: Diagnosis not present

## 2018-11-17 DIAGNOSIS — R262 Difficulty in walking, not elsewhere classified: Secondary | ICD-10-CM | POA: Diagnosis not present

## 2018-11-17 DIAGNOSIS — M25562 Pain in left knee: Secondary | ICD-10-CM | POA: Diagnosis not present

## 2018-11-17 DIAGNOSIS — M6281 Muscle weakness (generalized): Secondary | ICD-10-CM | POA: Diagnosis not present

## 2018-11-19 DIAGNOSIS — M25562 Pain in left knee: Secondary | ICD-10-CM | POA: Diagnosis not present

## 2018-11-19 DIAGNOSIS — M25561 Pain in right knee: Secondary | ICD-10-CM | POA: Diagnosis not present

## 2018-11-19 DIAGNOSIS — M6281 Muscle weakness (generalized): Secondary | ICD-10-CM | POA: Diagnosis not present

## 2018-11-19 DIAGNOSIS — M545 Low back pain: Secondary | ICD-10-CM | POA: Diagnosis not present

## 2018-11-19 DIAGNOSIS — R262 Difficulty in walking, not elsewhere classified: Secondary | ICD-10-CM | POA: Diagnosis not present

## 2018-11-25 DIAGNOSIS — M25562 Pain in left knee: Secondary | ICD-10-CM | POA: Diagnosis not present

## 2018-11-25 DIAGNOSIS — M6281 Muscle weakness (generalized): Secondary | ICD-10-CM | POA: Diagnosis not present

## 2018-11-25 DIAGNOSIS — R262 Difficulty in walking, not elsewhere classified: Secondary | ICD-10-CM | POA: Diagnosis not present

## 2018-11-25 DIAGNOSIS — M545 Low back pain: Secondary | ICD-10-CM | POA: Diagnosis not present

## 2018-11-25 DIAGNOSIS — M25561 Pain in right knee: Secondary | ICD-10-CM | POA: Diagnosis not present

## 2018-11-26 NOTE — Progress Notes (Signed)
Subjective:   Bianca Mckee is a 68 y.o. female who presents for an Initial Medicare Annual Wellness Visit.  Review of Systems    No ROS.  Medicare Wellness Virtual Visit.  Visual/audio telehealth visit, UTA vital signs.   See social history for additional risk factors.     Cardiac Risk Factors include: advanced age (>5men, >49 women);hypertension;sedentary lifestyle;family history of premature cardiovascular disease Sleep patterns: Getting on average of 6 hours of sleep a night with medications.Wakes up 1 time to void during the night. Wakes up and feels sluggish, states hates her job.   Home Safety/Smoke Alarms: Feels safe in home. Smoke alarms in place.  Living environment; Lives alone in an apartment on the bottom floor. No stairs to climb. Shower is a step over tub and grab bars are in place. Seat Belt Safety/Bike Helmet: Wears seat belt.   Female:   Pap-  UTD     Mammo-  UTD    Dexa scan- ordered      CCS- UTD      Objective:    There were no vitals filed for this visit. There is no height or weight on file to calculate BMI.  Advanced Directives 12/02/2018  Does Patient Have a Medical Advance Directive? No  Would patient like information on creating a medical advance directive? No - Patient declined    Current Medications (verified) Outpatient Encounter Medications as of 12/02/2018  Medication Sig  . ALPRAZolam (XANAX) 1 MG tablet TAKE 1/2 TO 1 TABLET BY MOUTH DAILY AS NEEDED FOR ANXIETY  . AMBULATORY NON FORMULARY MEDICATION Medication Name: Sleep Aid 3  . atorvastatin (LIPITOR) 40 MG tablet TAKE 1 TABLET BY MOUTH EVERYDAY AT BEDTIME  . buPROPion (WELLBUTRIN XL) 150 MG 24 hr tablet TAKE 1 TABLET BY MOUTH EVERY DAY  . doxepin (SINEQUAN) 10 MG capsule TAKE 1 CAPSULE (10 MG TOTAL) BY MOUTH AT BEDTIME.  . hydrOXYzine (ATARAX/VISTARIL) 25 MG tablet Take 1 tablet (25 mg total) by mouth 3 (three) times daily as needed.  . linaclotide (LINZESS) 145 MCG CAPS capsule Take  1 capsule (145 mcg total) by mouth daily.  . metFORMIN (GLUCOPHAGE) 1000 MG tablet TAKE 1 TABLET (1,000 MG TOTAL) BY MOUTH 2 (TWO) TIMES DAILY WITH A MEAL.  Marland Kitchen omeprazole (PRILOSEC) 40 MG capsule TAKE 1 CAPSULE BY MOUTH EVERY DAY  . sertraline (ZOLOFT) 100 MG tablet Take 1 tablet (100 mg total) by mouth daily.  Marland Kitchen telmisartan (MICARDIS) 40 MG tablet TAKE 1 TABLET (40 MG) BY ORAL ROUTE ONCE DAILY  . triamterene-hydrochlorothiazide (MAXZIDE) 75-50 MG tablet Take 1 tablet by mouth daily.  . [DISCONTINUED] AMBULATORY NON FORMULARY MEDICATION Medication Name: strips for One Touch Ultra 2  to test sugar once a day. Dx. Diabetes. E11.9  . AMBULATORY NON FORMULARY MEDICATION Shingrix vaccine to be administered. Repeat in 2-6 months   No facility-administered encounter medications on file as of 12/02/2018.     Allergies (verified) Ambien [zolpidem tartrate], Codeine phosphate, and Eszopiclone   History: Past Medical History:  Diagnosis Date  . Anxiety   . GERD (gastroesophageal reflux disease)   . Hypertension 1996  . Vaginal Pap smear, abnormal    Past Surgical History:  Procedure Laterality Date  . CERVIX LESION DESTRUCTION    . TUBAL LIGATION     Family History  Problem Relation Age of Onset  . Lung cancer Brother 20       smoker  . Pancreatic cancer Brother   . Pancreatic cancer Brother   .  Lung cancer Brother    Social History   Socioeconomic History  . Marital status: Divorced    Spouse name: Not on file  . Number of children: 1  . Years of education: 41  . Highest education level: 12th grade  Occupational History  . Not on file  Social Needs  . Financial resource strain: Not hard at all  . Food insecurity    Worry: Never true    Inability: Never true  . Transportation needs    Medical: No    Non-medical: No  Tobacco Use  . Smoking status: Never Smoker  . Smokeless tobacco: Never Used  Substance and Sexual Activity  . Alcohol use: Yes    Alcohol/week: 1.0 standard  drinks    Types: 1 Shots of liquor per week    Comment: occasionally  . Drug use: No  . Sexual activity: Yes    Birth control/protection: None  Lifestyle  . Physical activity    Days per week: 0 days    Minutes per session: 0 min  . Stress: Not at all  Relationships  . Social Herbalist on phone: Twice a week    Gets together: Never    Attends religious service: Never    Active member of club or organization: No    Attends meetings of clubs or organizations: Never    Relationship status: Divorced  Other Topics Concern  . Not on file  Social History Narrative   Still working at [employer] in [place].    Tobacco Counseling Counseling given: Not Answered   Clinical Intake:  Pre-visit preparation completed: Yes  Pain : No/denies pain     Nutritional Risks: None Diabetes: No  How often do you need to have someone help you when you read instructions, pamphlets, or other written materials from your doctor or pharmacy?: 1 - Never What is the last grade level you completed in school?: 12  Interpreter Needed?: No  Information entered by :: Orlie Dakin, LPN   Activities of Daily Living In your present state of health, do you have any difficulty performing the following activities: 12/02/2018  Hearing? N  Vision? N  Comment needs eye exam and will schedule  Difficulty concentrating or making decisions? Y  Comment short term memory not as good as used to be  Walking or climbing stairs? N  Dressing or bathing? N  Doing errands, shopping? N  Preparing Food and eating ? N  Using the Toilet? N  In the past six months, have you accidently leaked urine? N  Do you have problems with loss of bowel control? N  Managing your Medications? N  Managing your Finances? N  Housekeeping or managing your Housekeeping? N  Some recent data might be hidden     Immunizations and Health Maintenance Immunization History  Administered Date(s) Administered  .  Pneumococcal Conjugate-13 03/09/2014  . Pneumococcal Polysaccharide-23 01/06/2015  . Tdap 06/14/2015  . Zoster 01/06/2015   Health Maintenance Due  Topic Date Due  . DEXA SCAN  02/03/2018  . OPHTHALMOLOGY EXAM  07/25/2018  . FOOT EXAM  09/06/2018    Patient Care Team: Hali Marry, MD as PCP - General Clent Jacks, MD as Referring Physician (Nephrology) Dr. Louretta Shorten  (Gynecology)  Indicate any recent Medical Services you may have received from other than [facility] providers in the past year (date may be approximate).     Assessment:   This is a routine wellness examination for  Bianca Mckee.Physical assessment deferred to PCP.   Hearing/Vision screen  Hearing Screening   125Hz  250Hz  500Hz  1000Hz  2000Hz  3000Hz  4000Hz  6000Hz  8000Hz   Right ear:           Left ear:           Comments: Heaaring test not done , visit done via telephone due to [place] pandemic   Dietary issues and exercise activities discussed: Current Exercise Habits: The patient does not participate in regular exercise at present Diet States doesn't eat the healthiest right now- will order takeout abut does cook and when does cooks healthy. Breakfast:cereal Lunch: peanut butter and jelly sandwich or popcorn Dinner: Meat and potato and vegetables.       Goals    . Exercise 3x per week (30 min per time)     Wants to get started back into the gym and start exercising again.      Depression Screen PHQ 2/9 Scores 12/02/2018 08/19/2018 08/05/2018 03/17/2018 01/17/2018 12/30/2017 12/26/2017  PHQ - 2 Score 1 0 0 2 2 5 6   PHQ- 9 Score - - 4 8 7 13 17     Fall Risk Fall Risk  12/02/2018 08/05/2018  Falls in the past year? 1 0  Number falls in past yr: 0 -  Injury with Fall? 0 -  Risk for fall due to : Medication side effect -  Risk for fall due to: Comment Due to the Ambien -  Follow up Falls prevention discussed -    Is the patient's home free of loose throw rugs in walkways, pet beds, electrical cords, etc?    yes      Grab bars in the bathroom? yes      Handrails on the stairs?   no      Adequate lighting?   yes   Cognitive Function:     6CIT Screen 12/02/2018  What Year? 0 points  What month? 0 points  What time? 0 points  Count back from 20 0 points  Months in reverse 0 points  Repeat phrase 0 points  Total Score 0    Screening Tests Health Maintenance  Topic Date Due  . DEXA SCAN  02/03/2018  . OPHTHALMOLOGY EXAM  07/25/2018  . FOOT EXAM  09/06/2018  . INFLUENZA VACCINE  06/17/2019 (Originally 10/18/2018)  . HEMOGLOBIN A1C  02/06/2019  . PNA vac Low Risk Adult (2 of 2 - PPSV23) 01/06/2020  . MAMMOGRAM  02/07/2020  . COLONOSCOPY  12/19/2020  . PAP SMEAR-Modifier  09/17/2021  . TETANUS/TDAP  06/13/2025  . Hepatitis C Screening  Completed  . HIV Screening  Completed       Plan:      Bianca Mckee , Thank you for taking time to come for your Medicare Wellness Visit. I appreciate your ongoing commitment to your health goals. Please review the following plan we discussed and let me know if I can assist you in the future.  Please schedule your next medicare wellness visit with me in 1 yr. Continue doing brain stimulating activities (puzzles, reading, adult coloring books, staying active) to keep memory sharp.    These are the goals we discussed: Goals    . Exercise 3x per week (30 min per time)     Wants to get started back into the gym and start exercising again.       This is a list of the screening recommended for you and due dates:  Health Maintenance  Topic Date Due  . DEXA scan (  bone density measurement)  02/03/2018  . Eye exam for diabetics  07/25/2018  . Complete foot exam   09/06/2018  . Flu Shot  06/17/2019*  . Hemoglobin A1C  02/06/2019  . Pneumonia vaccines (2 of 2 - PPSV23) 01/06/2020  . Mammogram  02/07/2020  . Colon Cancer Screening  12/19/2020  . Pap Smear  09/17/2021  . Tetanus Vaccine  06/13/2025  .  Hepatitis C: One time screening is  recommended by Center for Disease Control  (CDC) for  adults born from [birth year] through 1963.   Completed  . HIV Screening  Completed  *Topic was postponed. The date shown is not the original due date.     I have personally reviewed and noted the following in the patient's chart:   . Medical and social history . Use of alcohol, tobacco or illicit drugs  . Current medications and supplements . Functional ability and status . Nutritional status . Physical activity . Advanced directives . List of other physicians . Hospitalizations, surgeries, and ER visits in previous 12 months . Vitals . Screenings to include cognitive, depression, and falls . Referrals and appointments  In addition, I have reviewed and discussed with patient certain preventive protocols, quality metrics, and best practice recommendations. A written personalized care plan for preventive services as well as general preventive health recommendations were provided to patient.     Joanne Chars, LPN   [phone]

## 2018-12-01 DIAGNOSIS — R262 Difficulty in walking, not elsewhere classified: Secondary | ICD-10-CM | POA: Diagnosis not present

## 2018-12-01 DIAGNOSIS — M25562 Pain in left knee: Secondary | ICD-10-CM | POA: Diagnosis not present

## 2018-12-01 DIAGNOSIS — M6281 Muscle weakness (generalized): Secondary | ICD-10-CM | POA: Diagnosis not present

## 2018-12-01 DIAGNOSIS — M25561 Pain in right knee: Secondary | ICD-10-CM | POA: Diagnosis not present

## 2018-12-01 DIAGNOSIS — M545 Low back pain: Secondary | ICD-10-CM | POA: Diagnosis not present

## 2018-12-02 ENCOUNTER — Ambulatory Visit (INDEPENDENT_AMBULATORY_CARE_PROVIDER_SITE_OTHER): Payer: Medicare Other | Admitting: *Deleted

## 2018-12-02 DIAGNOSIS — Z0000 Encounter for general adult medical examination without abnormal findings: Secondary | ICD-10-CM | POA: Diagnosis not present

## 2018-12-02 DIAGNOSIS — M25511 Pain in right shoulder: Secondary | ICD-10-CM | POA: Diagnosis not present

## 2018-12-02 DIAGNOSIS — Z13820 Encounter for screening for osteoporosis: Secondary | ICD-10-CM

## 2018-12-02 MED ORDER — AMBULATORY NON FORMULARY MEDICATION: 0 refills | Status: DC

## 2018-12-02 NOTE — Patient Instructions (Signed)
Bianca Mckee , Thank you for taking time to come for your Medicare Wellness Visit. I appreciate your ongoing commitment to your health goals. Please review the following plan we discussed and let me know if I can assist you in the future.  Please schedule your next medicare wellness visit with me in 1 yr. Continue doing brain stimulating activities (puzzles, reading, adult coloring books, staying active) to keep memory sharp.   These are the goals we discussed: Goals    . Exercise 3x per week (30 min per time)     Wants to get started back into the gym and start exercising again.

## 2018-12-03 DIAGNOSIS — R262 Difficulty in walking, not elsewhere classified: Secondary | ICD-10-CM | POA: Diagnosis not present

## 2018-12-03 DIAGNOSIS — M545 Low back pain: Secondary | ICD-10-CM | POA: Diagnosis not present

## 2018-12-03 DIAGNOSIS — M25562 Pain in left knee: Secondary | ICD-10-CM | POA: Diagnosis not present

## 2018-12-03 DIAGNOSIS — M6281 Muscle weakness (generalized): Secondary | ICD-10-CM | POA: Diagnosis not present

## 2018-12-03 DIAGNOSIS — M25561 Pain in right knee: Secondary | ICD-10-CM | POA: Diagnosis not present

## 2018-12-05 ENCOUNTER — Other Ambulatory Visit: Payer: Self-pay | Admitting: Family Medicine

## 2018-12-08 DIAGNOSIS — M25561 Pain in right knee: Secondary | ICD-10-CM | POA: Diagnosis not present

## 2018-12-08 DIAGNOSIS — M545 Low back pain: Secondary | ICD-10-CM | POA: Diagnosis not present

## 2018-12-08 DIAGNOSIS — M6281 Muscle weakness (generalized): Secondary | ICD-10-CM | POA: Diagnosis not present

## 2018-12-08 DIAGNOSIS — R262 Difficulty in walking, not elsewhere classified: Secondary | ICD-10-CM | POA: Diagnosis not present

## 2018-12-08 DIAGNOSIS — M25562 Pain in left knee: Secondary | ICD-10-CM | POA: Diagnosis not present

## 2018-12-10 DIAGNOSIS — M25561 Pain in right knee: Secondary | ICD-10-CM | POA: Diagnosis not present

## 2018-12-10 DIAGNOSIS — M545 Low back pain: Secondary | ICD-10-CM | POA: Diagnosis not present

## 2018-12-10 DIAGNOSIS — M25562 Pain in left knee: Secondary | ICD-10-CM | POA: Diagnosis not present

## 2018-12-10 DIAGNOSIS — R262 Difficulty in walking, not elsewhere classified: Secondary | ICD-10-CM | POA: Diagnosis not present

## 2018-12-10 DIAGNOSIS — M6281 Muscle weakness (generalized): Secondary | ICD-10-CM | POA: Diagnosis not present

## 2018-12-11 ENCOUNTER — Ambulatory Visit (INDEPENDENT_AMBULATORY_CARE_PROVIDER_SITE_OTHER): Payer: Medicare Other | Admitting: Family Medicine

## 2018-12-11 ENCOUNTER — Encounter: Payer: Self-pay | Admitting: Family Medicine

## 2018-12-11 ENCOUNTER — Other Ambulatory Visit: Payer: Self-pay

## 2018-12-11 VITALS — BP 109/67 | HR 82 | Ht 62.0 in | Wt 147.0 lb

## 2018-12-11 DIAGNOSIS — G8929 Other chronic pain: Secondary | ICD-10-CM

## 2018-12-11 DIAGNOSIS — E785 Hyperlipidemia, unspecified: Secondary | ICD-10-CM

## 2018-12-11 DIAGNOSIS — M25561 Pain in right knee: Secondary | ICD-10-CM

## 2018-12-11 DIAGNOSIS — E119 Type 2 diabetes mellitus without complications: Secondary | ICD-10-CM | POA: Diagnosis not present

## 2018-12-11 DIAGNOSIS — I10 Essential (primary) hypertension: Secondary | ICD-10-CM

## 2018-12-11 LAB — POCT GLYCOSYLATED HEMOGLOBIN (HGB A1C): Hemoglobin A1C: 6.1 % — AB (ref 4.0–5.6)

## 2018-12-11 MED ORDER — DICLOFENAC SODIUM 1 % TD GEL: 4.0000 g | Freq: Four times a day (QID) | TRANSDERMAL | 1 refills | Status: DC

## 2018-12-11 MED ORDER — TELMISARTAN 20 MG PO TABS: 20.0000 mg | ORAL_TABLET | Freq: Every day | ORAL | 0 refills | Status: DC

## 2018-12-11 NOTE — Progress Notes (Signed)
Established Patient Office Visit  Subjective:  Patient ID: Bianca Mckee, female    DOB: [birth year]  Age: 68 y.o. MRN: [id number]  CC:  Chief Complaint  Patient presents with  . Diabetes  . Hyperlipidemia    HPI [name] presents for   Hypertension- Pt denies chest pain, SOB, dizziness, or heart palpitations.  Taking meds as directed w/o problems.  Denies medication side effects.    Diabetes - no hypoglycemic events. No wounds or sores that are not healing well. No increased thirst or urination. Checking glucose at home. Taking medications as prescribed without any side effects.  He also complains of bilateral knee pain.  She has arthritis.  It usually gets worse in the wintertime and wants to know what she can do.  She tries to stay with NSAIDs because of her renal function.  Helps some but it is not great.  Hyperlipidemia - tolerating stating well with no myalgias or significant side effects.  Lab Results  Component Value Date   CHOL 236 (H) 08/06/2018   CHOL 225 (A) 07/23/2017   CHOL 226 (A) 07/23/2017   Lab Results  Component Value Date   HDL 38 (L) 08/06/2018   HDL 51 07/23/2017   HDL 40 06/27/2016   Lab Results  Component Value Date   LDLCALC 163 (H) 08/06/2018   LDLCALC 137 07/23/2017   LDLCALC 130 06/27/2016   Lab Results  Component Value Date   TRIG 195 (H) 08/06/2018   TRIG 159 (H) 01/05/2016   TRIG 213 (H) 12/12/2006   Lab Results  Component Value Date   CHOLHDL 6.2 (H) 08/06/2018   CHOLHDL 3.5 01/05/2016   CHOLHDL 5.2 Ratio 12/12/2006   No results found for: LDLDIRECT   Past Medical History:  Diagnosis Date  . Anxiety   . GERD (gastroesophageal reflux disease)   . Hypertension 1996  . Vaginal Pap smear, abnormal     Past Surgical History:  Procedure Laterality Date  . CERVIX LESION DESTRUCTION    . TUBAL LIGATION      Family History  Problem Relation Age of Onset  . Lung cancer Brother 71       smoker  . Pancreatic  cancer Brother   . Pancreatic cancer Brother   . Lung cancer Brother     Social History   Socioeconomic History  . Marital status: Divorced    Spouse name: Not on file  . Number of children: 1  . Years of education: 65  . Highest education level: 12th grade  Occupational History  . Not on file  Social Needs  . Financial resource strain: Not hard at all  . Food insecurity    Worry: Never true    Inability: Never true  . Transportation needs    Medical: No    Non-medical: No  Tobacco Use  . Smoking status: Never Smoker  . Smokeless tobacco: Never Used  Substance and Sexual Activity  . Alcohol use: Yes    Alcohol/week: 1.0 standard drinks    Types: 1 Shots of liquor per week    Comment: occasionally  . Drug use: No  . Sexual activity: Yes    Birth control/protection: None  Lifestyle  . Physical activity    Days per week: 0 days    Minutes per session: 0 min  . Stress: Not at all  Relationships  . Social Herbalist on phone: Twice a week    Gets together: Never  Attends religious service: Never    Active member of club or organization: No    Attends meetings of clubs or organizations: Never    Relationship status: Divorced  . Intimate partner violence    Fear of current or ex partner: No    Emotionally abused: No    Physically abused: No    Forced sexual activity: No  Other Topics Concern  . Not on file  Social History Narrative   Still working at [employer] in [place].    Outpatient Medications Prior to Visit  Medication Sig Dispense Refill  . ALPRAZolam (XANAX) 1 MG tablet TAKE 1/2 TO 1 TABLET BY MOUTH DAILY AS NEEDED FOR ANXIETY 30 tablet 1  . AMBULATORY NON FORMULARY MEDICATION Medication Name: Sleep Aid 3    . atorvastatin (LIPITOR) 40 MG tablet TAKE 1 TABLET BY MOUTH EVERYDAY AT BEDTIME 90 tablet 1  . buPROPion (WELLBUTRIN XL) 150 MG 24 hr tablet TAKE 1 TABLET BY MOUTH EVERY DAY 90 tablet 1  . doxepin (SINEQUAN) 10 MG capsule TAKE 1  CAPSULE (10 MG TOTAL) BY MOUTH AT BEDTIME. 90 capsule 1  . hydrOXYzine (ATARAX/VISTARIL) 25 MG tablet Take 1 tablet (25 mg total) by mouth 3 (three) times daily as needed. 30 tablet 0  . linaclotide (LINZESS) 145 MCG CAPS capsule Take 1 capsule (145 mcg total) by mouth daily. 90 capsule 3  . metFORMIN (GLUCOPHAGE) 1000 MG tablet TAKE 1 TABLET (1,000 MG TOTAL) BY MOUTH 2 (TWO) TIMES DAILY WITH A MEAL. 180 tablet 1  . omeprazole (PRILOSEC) 40 MG capsule TAKE 1 CAPSULE BY MOUTH EVERY DAY 90 capsule 1  . sertraline (ZOLOFT) 100 MG tablet Take 1 tablet (100 mg total) by mouth daily. 90 tablet 0  . triamterene-hydrochlorothiazide (MAXZIDE) 75-50 MG tablet Take 1 tablet by mouth daily.    Marland Kitchen telmisartan (MICARDIS) 40 MG tablet TAKE 1 TABLET (40 MG) BY ORAL ROUTE ONCE DAILY  3  . AMBULATORY NON FORMULARY MEDICATION Shingrix vaccine to be administered. Repeat in 2-6 months 1 Syringe 0   No facility-administered medications prior to visit.     Allergies  Allergen Reactions  . Ambien [Zolpidem Tartrate] Other (See Comments)    Sleep walking, injury  . Codeine Phosphate   . Eszopiclone Other (See Comments)    Metallic taste    ROS Review of Systems    Objective:    Physical Exam  Constitutional: She is oriented to person, place, and time. She appears well-developed and well-nourished.  HENT:  Head: Normocephalic and atraumatic.  Cardiovascular: Normal rate, regular rhythm and normal heart sounds.  Pulmonary/Chest: Effort normal and breath sounds normal.  Neurological: She is alert and oriented to person, place, and time.  Skin: Skin is warm and dry.  Psychiatric: She has a normal mood and affect. Her behavior is normal.    BP 109/67   Pulse 82   Ht 5\' 2"  (1.575 m)   Wt 147 lb (66.7 kg)   SpO2 100%   BMI 26.89 kg/m  Wt Readings from Last 3 Encounters:  12/11/18 147 lb (66.7 kg)  10/06/18 145 lb (65.8 kg)  09/18/18 145 lb (65.8 kg)     Health Maintenance Due  Topic Date Due  .  DEXA SCAN  02/03/2018  . OPHTHALMOLOGY EXAM  07/25/2018  . FOOT EXAM  09/06/2018    There are no preventive care reminders to display for this patient.  Lab Results  Component Value Date   TSH 1.754 04/17/2013  Lab Results  Component Value Date   WBC 10.5 11/03/2007   HGB 14.0 11/03/2007   HCT 43.6 11/03/2007   MCV 94.8 11/03/2007   PLT 394 11/03/2007   Lab Results  Component Value Date   NA 142 08/06/2018   K 4.4 08/06/2018   CO2 26 08/06/2018   GLUCOSE 126 (H) 08/06/2018   BUN 25 08/06/2018   CREATININE 1.19 (H) 08/06/2018   BILITOT 0.4 08/06/2018   ALKPHOS 71 01/05/2016   AST 12 08/06/2018   ALT 8 08/06/2018   PROT 6.7 08/06/2018   ALBUMIN 4.4 01/05/2016   CALCIUM 9.2 08/06/2018   Lab Results  Component Value Date   CHOL 236 (H) 08/06/2018   Lab Results  Component Value Date   HDL 38 (L) 08/06/2018   Lab Results  Component Value Date   LDLCALC 163 (H) 08/06/2018   Lab Results  Component Value Date   TRIG 195 (H) 08/06/2018   Lab Results  Component Value Date   CHOLHDL 6.2 (H) 08/06/2018   Lab Results  Component Value Date   HGBA1C 6.1 (A) 12/11/2018      Assessment & Plan:   Problem List Items Addressed This Visit      Cardiovascular and Mediastinum   HYPERTENSION, BENIGN SYSTEMIC    Ok to take 1/2 tab of the telmistartan.  Well controlled.  BP is actually a little low and was low last time.        Relevant Medications   telmisartan (MICARDIS) 20 MG tablet     Endocrine   Diabetes mellitus without complication (HCC)    [id number] looks great today.  Continue to work on healthy diet and regular exercise she has lost a lot of weight which is fantastic.  Follow-up in 3 to 4 months.      Relevant Medications   telmisartan (MICARDIS) 20 MG tablet   Other Relevant Orders   POCT glycosylated hemoglobin (Hb A1C) (Completed)     Other   Hyperlipidemia LDL goal <100    Continue statin.      Relevant Medications   telmisartan (MICARDIS) 20 MG  tablet   Chronic pain of right knee - Primary    We will try Voltaren gel.  Try to avoid oral NSAIDs because of renal function.      Relevant Medications   diclofenac sodium (VOLTAREN) 1 % GEL      Meds ordered this encounter  Medications  . telmisartan (MICARDIS) 20 MG tablet    Sig: Take 1 tablet (20 mg total) by mouth daily.    Dispense:  1 tablet    Refill:  0  . diclofenac sodium (VOLTAREN) 1 % GEL    Sig: Apply 4 g topically 4 (four) times daily.    Dispense:  400 g    Refill:  1    Follow-up: Return in about 4 months (around 04/12/2019) for Diabetes follow-up and labs.    Beatrice Lecher, MD

## 2018-12-11 NOTE — Assessment & Plan Note (Addendum)
Ok to take 1/2 tab of the telmistartan.  Well controlled.  BP is actually a little low and was low last time.

## 2018-12-11 NOTE — Assessment & Plan Note (Signed)
Continue statin. 

## 2018-12-11 NOTE — Assessment & Plan Note (Addendum)
We will try Voltaren gel.  Try to avoid oral NSAIDs because of renal function.

## 2018-12-11 NOTE — Assessment & Plan Note (Signed)
A1c looks great today.  Continue to work on healthy diet and regular exercise she has lost a lot of weight which is fantastic.  Follow-up in 3 to 4 months.

## 2018-12-15 DIAGNOSIS — R262 Difficulty in walking, not elsewhere classified: Secondary | ICD-10-CM | POA: Diagnosis not present

## 2018-12-15 DIAGNOSIS — M25561 Pain in right knee: Secondary | ICD-10-CM | POA: Diagnosis not present

## 2018-12-15 DIAGNOSIS — M6281 Muscle weakness (generalized): Secondary | ICD-10-CM | POA: Diagnosis not present

## 2018-12-15 DIAGNOSIS — M25562 Pain in left knee: Secondary | ICD-10-CM | POA: Diagnosis not present

## 2018-12-15 DIAGNOSIS — M545 Low back pain: Secondary | ICD-10-CM | POA: Diagnosis not present

## 2018-12-17 DIAGNOSIS — M545 Low back pain: Secondary | ICD-10-CM | POA: Diagnosis not present

## 2018-12-17 DIAGNOSIS — M25562 Pain in left knee: Secondary | ICD-10-CM | POA: Diagnosis not present

## 2018-12-17 DIAGNOSIS — M6281 Muscle weakness (generalized): Secondary | ICD-10-CM | POA: Diagnosis not present

## 2018-12-17 DIAGNOSIS — M25561 Pain in right knee: Secondary | ICD-10-CM | POA: Diagnosis not present

## 2018-12-17 DIAGNOSIS — R262 Difficulty in walking, not elsewhere classified: Secondary | ICD-10-CM | POA: Diagnosis not present

## 2018-12-24 ENCOUNTER — Other Ambulatory Visit: Payer: Self-pay

## 2018-12-24 ENCOUNTER — Other Ambulatory Visit: Payer: Self-pay | Admitting: Family Medicine

## 2018-12-24 ENCOUNTER — Ambulatory Visit (INDEPENDENT_AMBULATORY_CARE_PROVIDER_SITE_OTHER): Payer: Medicare Other

## 2018-12-24 DIAGNOSIS — Z780 Asymptomatic menopausal state: Secondary | ICD-10-CM | POA: Diagnosis not present

## 2018-12-24 DIAGNOSIS — M85852 Other specified disorders of bone density and structure, left thigh: Secondary | ICD-10-CM | POA: Diagnosis not present

## 2018-12-24 DIAGNOSIS — Z1231 Encounter for screening mammogram for malignant neoplasm of breast: Secondary | ICD-10-CM

## 2018-12-24 DIAGNOSIS — Z13820 Encounter for screening for osteoporosis: Secondary | ICD-10-CM

## 2018-12-25 DIAGNOSIS — M25562 Pain in left knee: Secondary | ICD-10-CM | POA: Diagnosis not present

## 2018-12-25 DIAGNOSIS — M25561 Pain in right knee: Secondary | ICD-10-CM | POA: Diagnosis not present

## 2018-12-25 DIAGNOSIS — M6281 Muscle weakness (generalized): Secondary | ICD-10-CM | POA: Diagnosis not present

## 2018-12-25 DIAGNOSIS — R262 Difficulty in walking, not elsewhere classified: Secondary | ICD-10-CM | POA: Diagnosis not present

## 2018-12-25 DIAGNOSIS — M545 Low back pain: Secondary | ICD-10-CM | POA: Diagnosis not present

## 2018-12-26 DIAGNOSIS — Z01812 Encounter for preprocedural laboratory examination: Secondary | ICD-10-CM | POA: Diagnosis not present

## 2018-12-26 DIAGNOSIS — Z01818 Encounter for other preprocedural examination: Secondary | ICD-10-CM | POA: Diagnosis not present

## 2018-12-26 DIAGNOSIS — Z01810 Encounter for preprocedural cardiovascular examination: Secondary | ICD-10-CM | POA: Diagnosis not present

## 2018-12-26 DIAGNOSIS — N882 Stricture and stenosis of cervix uteri: Secondary | ICD-10-CM | POA: Diagnosis not present

## 2018-12-30 DIAGNOSIS — I10 Essential (primary) hypertension: Secondary | ICD-10-CM | POA: Diagnosis not present

## 2018-12-30 DIAGNOSIS — Z7984 Long term (current) use of oral hypoglycemic drugs: Secondary | ICD-10-CM | POA: Diagnosis not present

## 2018-12-30 DIAGNOSIS — N879 Dysplasia of cervix uteri, unspecified: Secondary | ICD-10-CM | POA: Diagnosis not present

## 2018-12-30 DIAGNOSIS — E119 Type 2 diabetes mellitus without complications: Secondary | ICD-10-CM | POA: Diagnosis not present

## 2018-12-30 DIAGNOSIS — N840 Polyp of corpus uteri: Secondary | ICD-10-CM | POA: Diagnosis not present

## 2018-12-30 DIAGNOSIS — Z885 Allergy status to narcotic agent status: Secondary | ICD-10-CM | POA: Diagnosis not present

## 2018-12-30 DIAGNOSIS — N882 Stricture and stenosis of cervix uteri: Secondary | ICD-10-CM | POA: Diagnosis not present

## 2018-12-31 DIAGNOSIS — Z885 Allergy status to narcotic agent status: Secondary | ICD-10-CM | POA: Diagnosis not present

## 2018-12-31 DIAGNOSIS — E119 Type 2 diabetes mellitus without complications: Secondary | ICD-10-CM | POA: Diagnosis not present

## 2018-12-31 DIAGNOSIS — N882 Stricture and stenosis of cervix uteri: Secondary | ICD-10-CM | POA: Diagnosis not present

## 2018-12-31 DIAGNOSIS — N879 Dysplasia of cervix uteri, unspecified: Secondary | ICD-10-CM | POA: Diagnosis not present

## 2018-12-31 DIAGNOSIS — Z7984 Long term (current) use of oral hypoglycemic drugs: Secondary | ICD-10-CM | POA: Diagnosis not present

## 2018-12-31 DIAGNOSIS — I10 Essential (primary) hypertension: Secondary | ICD-10-CM | POA: Diagnosis not present

## 2019-01-01 ENCOUNTER — Other Ambulatory Visit: Payer: Self-pay | Admitting: Family Medicine

## 2019-01-01 DIAGNOSIS — M25511 Pain in right shoulder: Secondary | ICD-10-CM | POA: Diagnosis not present

## 2019-01-21 DIAGNOSIS — M545 Low back pain: Secondary | ICD-10-CM | POA: Diagnosis not present

## 2019-01-21 DIAGNOSIS — R262 Difficulty in walking, not elsewhere classified: Secondary | ICD-10-CM | POA: Diagnosis not present

## 2019-01-21 DIAGNOSIS — M25562 Pain in left knee: Secondary | ICD-10-CM | POA: Diagnosis not present

## 2019-01-21 DIAGNOSIS — M6281 Muscle weakness (generalized): Secondary | ICD-10-CM | POA: Diagnosis not present

## 2019-01-21 DIAGNOSIS — M25561 Pain in right knee: Secondary | ICD-10-CM | POA: Diagnosis not present

## 2019-01-22 ENCOUNTER — Other Ambulatory Visit: Payer: Self-pay | Admitting: Family Medicine

## 2019-01-22 DIAGNOSIS — G4700 Insomnia, unspecified: Secondary | ICD-10-CM

## 2019-01-22 DIAGNOSIS — M25511 Pain in right shoulder: Secondary | ICD-10-CM | POA: Diagnosis not present

## 2019-01-28 DIAGNOSIS — M25562 Pain in left knee: Secondary | ICD-10-CM | POA: Diagnosis not present

## 2019-01-28 DIAGNOSIS — M25561 Pain in right knee: Secondary | ICD-10-CM | POA: Diagnosis not present

## 2019-01-28 DIAGNOSIS — M6281 Muscle weakness (generalized): Secondary | ICD-10-CM | POA: Diagnosis not present

## 2019-01-28 DIAGNOSIS — M545 Low back pain: Secondary | ICD-10-CM | POA: Diagnosis not present

## 2019-01-28 DIAGNOSIS — R262 Difficulty in walking, not elsewhere classified: Secondary | ICD-10-CM | POA: Diagnosis not present

## 2019-02-01 DIAGNOSIS — M25511 Pain in right shoulder: Secondary | ICD-10-CM | POA: Diagnosis not present

## 2019-02-03 ENCOUNTER — Other Ambulatory Visit: Payer: Self-pay | Admitting: Family Medicine

## 2019-02-03 DIAGNOSIS — G8929 Other chronic pain: Secondary | ICD-10-CM

## 2019-02-04 DIAGNOSIS — R262 Difficulty in walking, not elsewhere classified: Secondary | ICD-10-CM | POA: Diagnosis not present

## 2019-02-04 DIAGNOSIS — M6281 Muscle weakness (generalized): Secondary | ICD-10-CM | POA: Diagnosis not present

## 2019-02-04 DIAGNOSIS — M545 Low back pain: Secondary | ICD-10-CM | POA: Diagnosis not present

## 2019-02-04 DIAGNOSIS — M25562 Pain in left knee: Secondary | ICD-10-CM | POA: Diagnosis not present

## 2019-02-04 DIAGNOSIS — M25561 Pain in right knee: Secondary | ICD-10-CM | POA: Diagnosis not present

## 2019-02-08 DIAGNOSIS — M545 Low back pain: Secondary | ICD-10-CM | POA: Diagnosis not present

## 2019-02-08 DIAGNOSIS — M25561 Pain in right knee: Secondary | ICD-10-CM | POA: Diagnosis not present

## 2019-02-08 DIAGNOSIS — M6281 Muscle weakness (generalized): Secondary | ICD-10-CM | POA: Diagnosis not present

## 2019-02-08 DIAGNOSIS — M25562 Pain in left knee: Secondary | ICD-10-CM | POA: Diagnosis not present

## 2019-02-08 DIAGNOSIS — R262 Difficulty in walking, not elsewhere classified: Secondary | ICD-10-CM | POA: Diagnosis not present

## 2019-02-10 DIAGNOSIS — M25561 Pain in right knee: Secondary | ICD-10-CM | POA: Diagnosis not present

## 2019-02-10 DIAGNOSIS — R262 Difficulty in walking, not elsewhere classified: Secondary | ICD-10-CM | POA: Diagnosis not present

## 2019-02-10 DIAGNOSIS — M545 Low back pain: Secondary | ICD-10-CM | POA: Diagnosis not present

## 2019-02-10 DIAGNOSIS — M6281 Muscle weakness (generalized): Secondary | ICD-10-CM | POA: Diagnosis not present

## 2019-02-10 DIAGNOSIS — M25562 Pain in left knee: Secondary | ICD-10-CM | POA: Diagnosis not present

## 2019-02-11 ENCOUNTER — Other Ambulatory Visit: Payer: Self-pay

## 2019-02-11 ENCOUNTER — Ambulatory Visit (INDEPENDENT_AMBULATORY_CARE_PROVIDER_SITE_OTHER): Payer: Medicare Other

## 2019-02-11 DIAGNOSIS — Z1231 Encounter for screening mammogram for malignant neoplasm of breast: Secondary | ICD-10-CM

## 2019-02-16 ENCOUNTER — Other Ambulatory Visit: Payer: Self-pay | Admitting: Family Medicine

## 2019-02-16 DIAGNOSIS — R928 Other abnormal and inconclusive findings on diagnostic imaging of breast: Secondary | ICD-10-CM

## 2019-02-17 ENCOUNTER — Ambulatory Visit: Payer: Medicare Other

## 2019-02-17 ENCOUNTER — Other Ambulatory Visit: Payer: Self-pay

## 2019-02-17 ENCOUNTER — Other Ambulatory Visit: Payer: Self-pay | Admitting: Family Medicine

## 2019-02-17 ENCOUNTER — Ambulatory Visit
Admission: RE | Admit: 2019-02-17 | Discharge: 2019-02-17 | Disposition: A | Discharge status: Home / Self Care | Payer: Medicare Other | Source: Ambulatory Visit | Attending: Family Medicine | Admitting: Family Medicine

## 2019-02-17 DIAGNOSIS — R928 Other abnormal and inconclusive findings on diagnostic imaging of breast: Secondary | ICD-10-CM | POA: Diagnosis not present

## 2019-02-23 DIAGNOSIS — Z09 Encounter for follow-up examination after completed treatment for conditions other than malignant neoplasm: Secondary | ICD-10-CM | POA: Diagnosis not present

## 2019-02-25 DIAGNOSIS — M6281 Muscle weakness (generalized): Secondary | ICD-10-CM | POA: Diagnosis not present

## 2019-02-25 DIAGNOSIS — M25561 Pain in right knee: Secondary | ICD-10-CM | POA: Diagnosis not present

## 2019-02-25 DIAGNOSIS — R262 Difficulty in walking, not elsewhere classified: Secondary | ICD-10-CM | POA: Diagnosis not present

## 2019-02-25 DIAGNOSIS — M545 Low back pain: Secondary | ICD-10-CM | POA: Diagnosis not present

## 2019-02-25 DIAGNOSIS — M25562 Pain in left knee: Secondary | ICD-10-CM | POA: Diagnosis not present

## 2019-03-03 ENCOUNTER — Other Ambulatory Visit: Payer: Self-pay | Admitting: Family Medicine

## 2019-03-03 DIAGNOSIS — M25511 Pain in right shoulder: Secondary | ICD-10-CM | POA: Diagnosis not present

## 2019-03-03 DIAGNOSIS — F331 Major depressive disorder, recurrent, moderate: Secondary | ICD-10-CM

## 2019-03-04 DIAGNOSIS — M25561 Pain in right knee: Secondary | ICD-10-CM | POA: Diagnosis not present

## 2019-03-04 DIAGNOSIS — M545 Low back pain: Secondary | ICD-10-CM | POA: Diagnosis not present

## 2019-03-04 DIAGNOSIS — M25562 Pain in left knee: Secondary | ICD-10-CM | POA: Diagnosis not present

## 2019-03-04 DIAGNOSIS — R262 Difficulty in walking, not elsewhere classified: Secondary | ICD-10-CM | POA: Diagnosis not present

## 2019-03-04 DIAGNOSIS — M6281 Muscle weakness (generalized): Secondary | ICD-10-CM | POA: Diagnosis not present

## 2019-03-11 DIAGNOSIS — M6281 Muscle weakness (generalized): Secondary | ICD-10-CM | POA: Diagnosis not present

## 2019-03-11 DIAGNOSIS — M25561 Pain in right knee: Secondary | ICD-10-CM | POA: Diagnosis not present

## 2019-03-11 DIAGNOSIS — R262 Difficulty in walking, not elsewhere classified: Secondary | ICD-10-CM | POA: Diagnosis not present

## 2019-03-11 DIAGNOSIS — M545 Low back pain: Secondary | ICD-10-CM | POA: Diagnosis not present

## 2019-03-11 DIAGNOSIS — M25562 Pain in left knee: Secondary | ICD-10-CM | POA: Diagnosis not present

## 2019-03-16 ENCOUNTER — Encounter: Payer: Self-pay | Admitting: Family Medicine

## 2019-03-16 MED ORDER — METFORMIN HCL 1000 MG PO TABS: 1000.0000 mg | ORAL_TABLET | Freq: Two times a day (BID) | ORAL | 1 refills | Status: DC

## 2019-03-18 ENCOUNTER — Encounter: Payer: Self-pay | Admitting: Family Medicine

## 2019-03-23 DIAGNOSIS — Z90710 Acquired absence of both cervix and uterus: Secondary | ICD-10-CM | POA: Diagnosis not present

## 2019-03-23 DIAGNOSIS — Z86001 Personal history of in-situ neoplasm of cervix uteri: Secondary | ICD-10-CM | POA: Diagnosis not present

## 2019-03-23 DIAGNOSIS — Z90721 Acquired absence of ovaries, unilateral: Secondary | ICD-10-CM | POA: Diagnosis not present

## 2019-03-23 DIAGNOSIS — Z09 Encounter for follow-up examination after completed treatment for conditions other than malignant neoplasm: Secondary | ICD-10-CM | POA: Diagnosis not present

## 2019-03-23 DIAGNOSIS — N879 Dysplasia of cervix uteri, unspecified: Secondary | ICD-10-CM | POA: Diagnosis not present

## 2019-03-23 DIAGNOSIS — Z79899 Other long term (current) drug therapy: Secondary | ICD-10-CM | POA: Diagnosis not present

## 2019-03-26 DIAGNOSIS — M25562 Pain in left knee: Secondary | ICD-10-CM | POA: Diagnosis not present

## 2019-03-26 DIAGNOSIS — M545 Low back pain: Secondary | ICD-10-CM | POA: Diagnosis not present

## 2019-03-26 DIAGNOSIS — M6281 Muscle weakness (generalized): Secondary | ICD-10-CM | POA: Diagnosis not present

## 2019-03-26 DIAGNOSIS — M25561 Pain in right knee: Secondary | ICD-10-CM | POA: Diagnosis not present

## 2019-03-26 DIAGNOSIS — R262 Difficulty in walking, not elsewhere classified: Secondary | ICD-10-CM | POA: Diagnosis not present

## 2019-03-30 ENCOUNTER — Other Ambulatory Visit: Payer: Self-pay | Admitting: Family Medicine

## 2019-03-30 DIAGNOSIS — M25562 Pain in left knee: Secondary | ICD-10-CM | POA: Diagnosis not present

## 2019-03-30 DIAGNOSIS — M79641 Pain in right hand: Secondary | ICD-10-CM | POA: Diagnosis not present

## 2019-03-30 DIAGNOSIS — G4700 Insomnia, unspecified: Secondary | ICD-10-CM

## 2019-04-01 ENCOUNTER — Other Ambulatory Visit: Payer: Self-pay | Admitting: Family Medicine

## 2019-04-01 DIAGNOSIS — M25562 Pain in left knee: Secondary | ICD-10-CM | POA: Diagnosis not present

## 2019-04-01 DIAGNOSIS — M6281 Muscle weakness (generalized): Secondary | ICD-10-CM | POA: Diagnosis not present

## 2019-04-01 DIAGNOSIS — M542 Cervicalgia: Secondary | ICD-10-CM | POA: Diagnosis not present

## 2019-04-01 DIAGNOSIS — M545 Low back pain: Secondary | ICD-10-CM | POA: Diagnosis not present

## 2019-04-01 DIAGNOSIS — R262 Difficulty in walking, not elsewhere classified: Secondary | ICD-10-CM | POA: Diagnosis not present

## 2019-04-01 DIAGNOSIS — M25561 Pain in right knee: Secondary | ICD-10-CM | POA: Diagnosis not present

## 2019-04-06 DIAGNOSIS — M6281 Muscle weakness (generalized): Secondary | ICD-10-CM | POA: Diagnosis not present

## 2019-04-06 DIAGNOSIS — R262 Difficulty in walking, not elsewhere classified: Secondary | ICD-10-CM | POA: Diagnosis not present

## 2019-04-06 DIAGNOSIS — M542 Cervicalgia: Secondary | ICD-10-CM | POA: Diagnosis not present

## 2019-04-06 DIAGNOSIS — M25561 Pain in right knee: Secondary | ICD-10-CM | POA: Diagnosis not present

## 2019-04-06 DIAGNOSIS — M25562 Pain in left knee: Secondary | ICD-10-CM | POA: Diagnosis not present

## 2019-04-06 DIAGNOSIS — M545 Low back pain: Secondary | ICD-10-CM | POA: Diagnosis not present

## 2019-04-08 DIAGNOSIS — M25562 Pain in left knee: Secondary | ICD-10-CM | POA: Diagnosis not present

## 2019-04-08 DIAGNOSIS — M545 Low back pain: Secondary | ICD-10-CM | POA: Diagnosis not present

## 2019-04-08 DIAGNOSIS — M25561 Pain in right knee: Secondary | ICD-10-CM | POA: Diagnosis not present

## 2019-04-08 DIAGNOSIS — M542 Cervicalgia: Secondary | ICD-10-CM | POA: Diagnosis not present

## 2019-04-08 DIAGNOSIS — R262 Difficulty in walking, not elsewhere classified: Secondary | ICD-10-CM | POA: Diagnosis not present

## 2019-04-08 DIAGNOSIS — M6281 Muscle weakness (generalized): Secondary | ICD-10-CM | POA: Diagnosis not present

## 2019-04-13 DIAGNOSIS — M6281 Muscle weakness (generalized): Secondary | ICD-10-CM | POA: Diagnosis not present

## 2019-04-13 DIAGNOSIS — M542 Cervicalgia: Secondary | ICD-10-CM | POA: Diagnosis not present

## 2019-04-13 DIAGNOSIS — M25561 Pain in right knee: Secondary | ICD-10-CM | POA: Diagnosis not present

## 2019-04-13 DIAGNOSIS — M545 Low back pain: Secondary | ICD-10-CM | POA: Diagnosis not present

## 2019-04-13 DIAGNOSIS — M25562 Pain in left knee: Secondary | ICD-10-CM | POA: Diagnosis not present

## 2019-04-13 DIAGNOSIS — R262 Difficulty in walking, not elsewhere classified: Secondary | ICD-10-CM | POA: Diagnosis not present

## 2019-04-14 ENCOUNTER — Ambulatory Visit: Payer: Medicare Other | Admitting: Family Medicine

## 2019-04-15 DIAGNOSIS — M6281 Muscle weakness (generalized): Secondary | ICD-10-CM | POA: Diagnosis not present

## 2019-04-15 DIAGNOSIS — R262 Difficulty in walking, not elsewhere classified: Secondary | ICD-10-CM | POA: Diagnosis not present

## 2019-04-15 DIAGNOSIS — M542 Cervicalgia: Secondary | ICD-10-CM | POA: Diagnosis not present

## 2019-04-15 DIAGNOSIS — M545 Low back pain: Secondary | ICD-10-CM | POA: Diagnosis not present

## 2019-04-15 DIAGNOSIS — M25562 Pain in left knee: Secondary | ICD-10-CM | POA: Diagnosis not present

## 2019-04-15 DIAGNOSIS — M25561 Pain in right knee: Secondary | ICD-10-CM | POA: Diagnosis not present

## 2019-04-16 ENCOUNTER — Encounter: Payer: Self-pay | Admitting: Family Medicine

## 2019-04-16 DIAGNOSIS — M542 Cervicalgia: Secondary | ICD-10-CM

## 2019-04-17 NOTE — Telephone Encounter (Signed)
Order placed

## 2019-04-17 NOTE — Telephone Encounter (Signed)
Not exactly sure about this referral, can you please review and add DX

## 2019-04-20 DIAGNOSIS — Z79899 Other long term (current) drug therapy: Secondary | ICD-10-CM | POA: Diagnosis not present

## 2019-04-20 DIAGNOSIS — Z90710 Acquired absence of both cervix and uterus: Secondary | ICD-10-CM | POA: Diagnosis not present

## 2019-04-20 DIAGNOSIS — N879 Dysplasia of cervix uteri, unspecified: Secondary | ICD-10-CM | POA: Diagnosis not present

## 2019-04-20 DIAGNOSIS — Z90721 Acquired absence of ovaries, unilateral: Secondary | ICD-10-CM | POA: Diagnosis not present

## 2019-04-22 DIAGNOSIS — M25561 Pain in right knee: Secondary | ICD-10-CM | POA: Diagnosis not present

## 2019-04-22 DIAGNOSIS — M6281 Muscle weakness (generalized): Secondary | ICD-10-CM | POA: Diagnosis not present

## 2019-04-22 DIAGNOSIS — R262 Difficulty in walking, not elsewhere classified: Secondary | ICD-10-CM | POA: Diagnosis not present

## 2019-04-22 DIAGNOSIS — M545 Low back pain: Secondary | ICD-10-CM | POA: Diagnosis not present

## 2019-04-22 DIAGNOSIS — M542 Cervicalgia: Secondary | ICD-10-CM | POA: Diagnosis not present

## 2019-04-22 DIAGNOSIS — M25562 Pain in left knee: Secondary | ICD-10-CM | POA: Diagnosis not present

## 2019-04-27 DIAGNOSIS — M25562 Pain in left knee: Secondary | ICD-10-CM | POA: Diagnosis not present

## 2019-04-27 DIAGNOSIS — M25561 Pain in right knee: Secondary | ICD-10-CM | POA: Diagnosis not present

## 2019-04-27 DIAGNOSIS — M542 Cervicalgia: Secondary | ICD-10-CM | POA: Diagnosis not present

## 2019-04-27 DIAGNOSIS — M545 Low back pain: Secondary | ICD-10-CM | POA: Diagnosis not present

## 2019-04-27 DIAGNOSIS — R262 Difficulty in walking, not elsewhere classified: Secondary | ICD-10-CM | POA: Diagnosis not present

## 2019-04-27 DIAGNOSIS — M6281 Muscle weakness (generalized): Secondary | ICD-10-CM | POA: Diagnosis not present

## 2019-04-29 DIAGNOSIS — M6281 Muscle weakness (generalized): Secondary | ICD-10-CM | POA: Diagnosis not present

## 2019-04-29 DIAGNOSIS — M542 Cervicalgia: Secondary | ICD-10-CM | POA: Diagnosis not present

## 2019-04-29 DIAGNOSIS — M25562 Pain in left knee: Secondary | ICD-10-CM | POA: Diagnosis not present

## 2019-04-29 DIAGNOSIS — M25561 Pain in right knee: Secondary | ICD-10-CM | POA: Diagnosis not present

## 2019-04-29 DIAGNOSIS — M545 Low back pain: Secondary | ICD-10-CM | POA: Diagnosis not present

## 2019-04-29 DIAGNOSIS — R262 Difficulty in walking, not elsewhere classified: Secondary | ICD-10-CM | POA: Diagnosis not present

## 2019-04-30 ENCOUNTER — Ambulatory Visit (INDEPENDENT_AMBULATORY_CARE_PROVIDER_SITE_OTHER): Payer: Medicare HMO | Admitting: Family Medicine

## 2019-04-30 ENCOUNTER — Other Ambulatory Visit: Payer: Self-pay

## 2019-04-30 ENCOUNTER — Encounter: Payer: Self-pay | Admitting: Family Medicine

## 2019-04-30 VITALS — BP 105/66 | HR 84 | Ht 62.0 in | Wt 141.0 lb

## 2019-04-30 DIAGNOSIS — F331 Major depressive disorder, recurrent, moderate: Secondary | ICD-10-CM

## 2019-04-30 DIAGNOSIS — E119 Type 2 diabetes mellitus without complications: Secondary | ICD-10-CM | POA: Diagnosis not present

## 2019-04-30 DIAGNOSIS — I10 Essential (primary) hypertension: Secondary | ICD-10-CM

## 2019-04-30 DIAGNOSIS — M542 Cervicalgia: Secondary | ICD-10-CM | POA: Diagnosis not present

## 2019-04-30 DIAGNOSIS — G8929 Other chronic pain: Secondary | ICD-10-CM

## 2019-04-30 DIAGNOSIS — N1831 Chronic kidney disease, stage 3a: Secondary | ICD-10-CM

## 2019-04-30 DIAGNOSIS — E1122 Type 2 diabetes mellitus with diabetic chronic kidney disease: Secondary | ICD-10-CM | POA: Diagnosis not present

## 2019-04-30 LAB — POCT GLYCOSYLATED HEMOGLOBIN (HGB A1C): Hemoglobin A1C: 6 % — AB (ref 4.0–5.6)

## 2019-04-30 NOTE — Assessment & Plan Note (Signed)
Went to therapy twice a week.  Encouraged her to talk with Dr. Nickola Major her orthopedist about it to see if she might be a candidate for some type of cervical injections or even at least trigger point injections.  She does get temporary relief with dry needling.

## 2019-04-30 NOTE — Assessment & Plan Note (Signed)
Well controlled. Continue current regimen. Follow up in  6 mo. Due for labs in May.

## 2019-04-30 NOTE — Patient Instructions (Signed)
Be due for labs at the end of May/early June.

## 2019-04-30 NOTE — Progress Notes (Signed)
Established Patient Office Visit  Subjective:  Patient ID: Bianca Mckee, female    DOB: 08-Sep-1950  Age: 69 y.o. MRN: [id number]  CC:  Chief Complaint  Patient presents with  . Diabetes  . Hypertension    HPI [name] presents for   Hypertension- Pt denies chest pain, SOB, dizziness, or heart palpitations.  Taking meds as directed w/o problems.  Denies medication side effects.    Diabetes - no hypoglycemic events. No wounds or sores that are not healing well. No increased thirst or urination. Checking glucose at home. Taking medications as prescribed without any side effects.  F/U MDD -overall mood is okay.  I think sometimes she gets a little lonely and bored at home but tries to keep busy.  Work is stressful for her.  Still having a lot of pain and problems in her neck.  She goes to physical therapy twice a week.  She said she did get an injection in her left knee which helped with that.  She has had dry needling done and tries to use Tylenol and anti-inflammatories for her neck.  Past Medical History:  Diagnosis Date  . Anxiety   . GERD (gastroesophageal reflux disease)   . Hypertension 1996  . Vaginal Pap smear, abnormal     Past Surgical History:  Procedure Laterality Date  . CERVIX LESION DESTRUCTION    . TUBAL LIGATION      Family History  Problem Relation Age of Onset  . Lung cancer Brother 25       smoker  . Pancreatic cancer Brother   . Pancreatic cancer Brother   . Lung cancer Brother     Social History   Socioeconomic History  . Marital status: Divorced    Spouse name: Not on file  . Number of children: 1  . Years of education: 36  . Highest education level: 12th grade  Occupational History  . Not on file  Tobacco Use  . Smoking status: Never Smoker  . Smokeless tobacco: Never Used  Substance and Sexual Activity  . Alcohol use: Yes    Alcohol/week: 1.0 standard drinks    Types: 1 Shots of liquor per week    Comment: occasionally   . Drug use: No  . Sexual activity: Yes    Birth control/protection: None  Other Topics Concern  . Not on file  Social History Narrative   Still working at [employer] in [place].   Social Determinants of Health   Financial Resource Strain: Low Risk   . Difficulty of Paying Living Expenses: Not hard at all  Food Insecurity: No Food Insecurity  . Worried About Charity fundraiser in the Last Year: Never true  . Ran Out of Food in the Last Year: Never true  Transportation Needs: No Transportation Needs  . Lack of Transportation (Medical): No  . Lack of Transportation (Non-Medical): No  Physical Activity: Inactive  . Days of Exercise per Week: 0 days  . Minutes of Exercise per Session: 0 min  Stress: No Stress Concern Present  . Feeling of Stress : Not at all  Social Connections: Severely Isolated  . Frequency of Communication with Friends and Family: Twice a week  . Frequency of Social Gatherings with Friends and Family: Never  . Attends Religious Services: Never  . Active Member of Clubs or Organizations: No  . Attends Archivist Meetings: Never  . Marital Status: Divorced  Human resources officer Violence: Not At Risk  .  Fear of Current or Ex-Partner: No  . Emotionally Abused: No  . Physically Abused: No  . Sexually Abused: No    Outpatient Medications Prior to Visit  Medication Sig Dispense Refill  . ALPRAZolam (XANAX) 1 MG tablet TAKE 1/2 TO 1 TABLET BY MOUTH DAILY AS NEEDED FOR ANXIETY 30 tablet 1  . atorvastatin (LIPITOR) 40 MG tablet TAKE 1 TABLET BY MOUTH EVERYDAY AT BEDTIME 90 tablet 1  . buPROPion (WELLBUTRIN XL) 150 MG 24 hr tablet TAKE 1 TABLET BY MOUTH EVERY DAY 90 tablet 1  . diclofenac Sodium (VOLTAREN) 1 % GEL APPLY 4 G TOPICALLY 4 (FOUR) TIMES DAILY. 400 g 1  . doxepin (SINEQUAN) 10 MG capsule TAKE 1 CAPSULE (10 MG TOTAL) BY MOUTH AT BEDTIME. 90 capsule 1  . hydrOXYzine (ATARAX/VISTARIL) 25 MG tablet TAKE 1 TABLET BY MOUTH THREE TIMES A DAY AS NEEDED  30 tablet 0  . linaclotide (LINZESS) 145 MCG CAPS capsule Take 1 capsule (145 mcg total) by mouth daily. 90 capsule 3  . Melatonin 10 MG TABS Take 10 mg by mouth at bedtime as needed.    . metFORMIN (GLUCOPHAGE) 1000 MG tablet Take 1 tablet (1,000 mg total) by mouth 2 (two) times daily with a meal. 180 tablet 1  . omeprazole (PRILOSEC) 40 MG capsule TAKE 1 CAPSULE BY MOUTH EVERY DAY 90 capsule 1  . sertraline (ZOLOFT) 100 MG tablet TAKE 1 TABLET BY MOUTH EVERY DAY 90 tablet 0  . telmisartan (MICARDIS) 20 MG tablet Take 1 tablet (20 mg total) by mouth daily. 1 tablet 0  . triamterene-hydrochlorothiazide (MAXZIDE) 75-50 MG tablet Take 1 tablet by mouth daily.    . AMBULATORY NON FORMULARY MEDICATION Medication Name: Sleep Aid 3     No facility-administered medications prior to visit.    Allergies  Allergen Reactions  . Ambien [Zolpidem Tartrate] Other (See Comments)    Sleep walking, injury  . Codeine Phosphate   . Eszopiclone Other (See Comments)    Metallic taste    ROS Review of Systems    Objective:    Physical Exam  Constitutional: She is oriented to person, place, and time. She appears well-developed and well-nourished.  HENT:  Head: Normocephalic and atraumatic.  Cardiovascular: Normal rate, regular rhythm and normal heart sounds.  Pulmonary/Chest: Effort normal and breath sounds normal.  Neurological: She is alert and oriented to person, place, and time.  Skin: Skin is warm and dry.  Psychiatric: She has a normal mood and affect. Her behavior is normal.    BP 105/66   Pulse 84   Ht 5\' 2"  (1.575 m)   Wt 141 lb (64 kg)   SpO2 99%   BMI 25.79 kg/m  Wt Readings from Last 3 Encounters:  04/30/19 141 lb (64 kg)  12/11/18 147 lb (66.7 kg)  10/06/18 145 lb (65.8 kg)     Health Maintenance Due  Topic Date Due  . OPHTHALMOLOGY EXAM  07/25/2018    There are no preventive care reminders to display for this patient.  Lab Results  Component Value Date   TSH 1.754  04/17/2013   Lab Results  Component Value Date   WBC 10.5 11/03/2007   HGB 14.0 11/03/2007   HCT 43.6 11/03/2007   MCV 94.8 11/03/2007   PLT 394 11/03/2007   Lab Results  Component Value Date   NA 139 04/30/2019   K 4.5 04/30/2019   CO2 26 04/30/2019   GLUCOSE 88 04/30/2019   BUN 32 (H) 04/30/2019  CREATININE 1.17 (H) 04/30/2019   BILITOT 0.4 08/06/2018   ALKPHOS 71 01/05/2016   AST 12 08/06/2018   ALT 8 08/06/2018   PROT 6.7 08/06/2018   ALBUMIN 4.4 01/05/2016   CALCIUM 9.9 04/30/2019   Lab Results  Component Value Date   CHOL 236 (H) 08/06/2018   Lab Results  Component Value Date   HDL 38 (L) 08/06/2018   Lab Results  Component Value Date   LDLCALC 163 (H) 08/06/2018   Lab Results  Component Value Date   TRIG 195 (H) 08/06/2018   Lab Results  Component Value Date   CHOLHDL 6.2 (H) 08/06/2018   Lab Results  Component Value Date   HGBA1C 6.0 (A) 04/30/2019      Assessment & Plan:   Problem List Items Addressed This Visit      Cardiovascular and Mediastinum   HYPERTENSION, BENIGN SYSTEMIC    Well controlled. Continue current regimen. Follow up in  6 mo. Due for labs in May.        Relevant Orders   BASIC METABOLIC PANEL WITH GFR (Completed)     Endocrine   Diabetes mellitus without complication (HCC) - Primary   Relevant Orders   POCT glycosylated hemoglobin (Hb A1C) (Completed)   BASIC METABOLIC PANEL WITH GFR (Completed)     Other   Major depressive disorder, recurrent episode ([place])    Reports episodes of feeling more down since being more restricted to home with Covid.  She still going into the office to work but does not have much to look forward to when she gets home.  She is happy with her medication regimen though and does not want to change anything today.  PHQ-9 score of 10 and GAD-7 score of 8.      Chronic cervical pain    Went to therapy twice a week.  Encouraged her to talk with Dr. Nickola Major her orthopedist about it to see if  she might be a candidate for some type of cervical injections or even at least trigger point injections.  She does get temporary relief with dry needling.       Other Visit Diagnoses    Stage 3a chronic kidney disease       Relevant Orders   BASIC METABOLIC PANEL WITH GFR (Completed)   Urine Microalbumin w/creat. ratio (Completed)       Forms signed for PT for her neck.    No orders of the defined types were placed in this encounter.   Follow-up: Return in about 6 months (around 10/28/2019) for Hypertension, Diabetes follow-up.    Beatrice Lecher, MD

## 2019-04-30 NOTE — Assessment & Plan Note (Signed)
Reports episodes of feeling more down since being more restricted to home with Covid.  She still going into the office to work but does not have much to look forward to when she gets home.  She is happy with her medication regimen though and does not want to change anything today.  PHQ-9 score of 10 and GAD-7 score of 8.

## 2019-05-01 ENCOUNTER — Encounter: Payer: Self-pay | Admitting: Family Medicine

## 2019-05-01 LAB — BASIC METABOLIC PANEL WITH GFR
BUN/Creatinine Ratio: 27 (calc) — ABNORMAL HIGH (ref 6–22)
BUN: 32 mg/dL — ABNORMAL HIGH (ref 7–25)
CO2: 26 mmol/L (ref 20–32)
Calcium: 9.9 mg/dL (ref 8.6–10.4)
Chloride: 103 mmol/L (ref 98–110)
Creat: 1.17 mg/dL — ABNORMAL HIGH (ref 0.50–0.99)
GFR, Est African American: 56 mL/min/{1.73_m2} — ABNORMAL LOW (ref >=60)
GFR, Est Non African American: 49 mL/min/{1.73_m2} — ABNORMAL LOW (ref >=60)
Glucose, Bld: 88 mg/dL (ref 65–99)
Potassium: 4.5 mmol/L (ref 3.5–5.3)
Sodium: 139 mmol/L (ref 135–146)

## 2019-05-01 LAB — MICROALBUMIN / CREATININE URINE RATIO
Creatinine, Urine: 106 mg/dL (ref 20–275)
Microalb Creat Ratio: 4 mcg/mg creat (ref <30)
Microalb, Ur: 0.4 mg/dL

## 2019-05-04 DIAGNOSIS — M542 Cervicalgia: Secondary | ICD-10-CM | POA: Diagnosis not present

## 2019-05-04 DIAGNOSIS — M25561 Pain in right knee: Secondary | ICD-10-CM | POA: Diagnosis not present

## 2019-05-04 DIAGNOSIS — M6281 Muscle weakness (generalized): Secondary | ICD-10-CM | POA: Diagnosis not present

## 2019-05-04 DIAGNOSIS — M25562 Pain in left knee: Secondary | ICD-10-CM | POA: Diagnosis not present

## 2019-05-04 DIAGNOSIS — R262 Difficulty in walking, not elsewhere classified: Secondary | ICD-10-CM | POA: Diagnosis not present

## 2019-05-04 DIAGNOSIS — M545 Low back pain: Secondary | ICD-10-CM | POA: Diagnosis not present

## 2019-05-06 DIAGNOSIS — R262 Difficulty in walking, not elsewhere classified: Secondary | ICD-10-CM | POA: Diagnosis not present

## 2019-05-06 DIAGNOSIS — M542 Cervicalgia: Secondary | ICD-10-CM | POA: Diagnosis not present

## 2019-05-06 DIAGNOSIS — M25562 Pain in left knee: Secondary | ICD-10-CM | POA: Diagnosis not present

## 2019-05-06 DIAGNOSIS — M545 Low back pain: Secondary | ICD-10-CM | POA: Diagnosis not present

## 2019-05-06 DIAGNOSIS — M6281 Muscle weakness (generalized): Secondary | ICD-10-CM | POA: Diagnosis not present

## 2019-05-06 DIAGNOSIS — M25561 Pain in right knee: Secondary | ICD-10-CM | POA: Diagnosis not present

## 2019-05-11 DIAGNOSIS — M25561 Pain in right knee: Secondary | ICD-10-CM | POA: Diagnosis not present

## 2019-05-11 DIAGNOSIS — M25562 Pain in left knee: Secondary | ICD-10-CM | POA: Diagnosis not present

## 2019-05-11 DIAGNOSIS — M542 Cervicalgia: Secondary | ICD-10-CM | POA: Diagnosis not present

## 2019-05-11 DIAGNOSIS — M6281 Muscle weakness (generalized): Secondary | ICD-10-CM | POA: Diagnosis not present

## 2019-05-11 DIAGNOSIS — M545 Low back pain: Secondary | ICD-10-CM | POA: Diagnosis not present

## 2019-05-11 DIAGNOSIS — R262 Difficulty in walking, not elsewhere classified: Secondary | ICD-10-CM | POA: Diagnosis not present

## 2019-05-13 DIAGNOSIS — M25561 Pain in right knee: Secondary | ICD-10-CM | POA: Diagnosis not present

## 2019-05-13 DIAGNOSIS — M6281 Muscle weakness (generalized): Secondary | ICD-10-CM | POA: Diagnosis not present

## 2019-05-13 DIAGNOSIS — M25562 Pain in left knee: Secondary | ICD-10-CM | POA: Diagnosis not present

## 2019-05-13 DIAGNOSIS — M542 Cervicalgia: Secondary | ICD-10-CM | POA: Diagnosis not present

## 2019-05-13 DIAGNOSIS — R262 Difficulty in walking, not elsewhere classified: Secondary | ICD-10-CM | POA: Diagnosis not present

## 2019-05-13 DIAGNOSIS — M545 Low back pain: Secondary | ICD-10-CM | POA: Diagnosis not present

## 2019-05-18 DIAGNOSIS — M25561 Pain in right knee: Secondary | ICD-10-CM | POA: Diagnosis not present

## 2019-05-18 DIAGNOSIS — M545 Low back pain: Secondary | ICD-10-CM | POA: Diagnosis not present

## 2019-05-18 DIAGNOSIS — M6281 Muscle weakness (generalized): Secondary | ICD-10-CM | POA: Diagnosis not present

## 2019-05-18 DIAGNOSIS — M25562 Pain in left knee: Secondary | ICD-10-CM | POA: Diagnosis not present

## 2019-05-18 DIAGNOSIS — R262 Difficulty in walking, not elsewhere classified: Secondary | ICD-10-CM | POA: Diagnosis not present

## 2019-05-18 DIAGNOSIS — M542 Cervicalgia: Secondary | ICD-10-CM | POA: Diagnosis not present

## 2019-05-20 DIAGNOSIS — M542 Cervicalgia: Secondary | ICD-10-CM | POA: Diagnosis not present

## 2019-05-20 DIAGNOSIS — R262 Difficulty in walking, not elsewhere classified: Secondary | ICD-10-CM | POA: Diagnosis not present

## 2019-05-20 DIAGNOSIS — M25561 Pain in right knee: Secondary | ICD-10-CM | POA: Diagnosis not present

## 2019-05-20 DIAGNOSIS — M545 Low back pain: Secondary | ICD-10-CM | POA: Diagnosis not present

## 2019-05-20 DIAGNOSIS — M6281 Muscle weakness (generalized): Secondary | ICD-10-CM | POA: Diagnosis not present

## 2019-05-20 DIAGNOSIS — M25562 Pain in left knee: Secondary | ICD-10-CM | POA: Diagnosis not present

## 2019-05-25 DIAGNOSIS — M6281 Muscle weakness (generalized): Secondary | ICD-10-CM | POA: Diagnosis not present

## 2019-05-25 DIAGNOSIS — R262 Difficulty in walking, not elsewhere classified: Secondary | ICD-10-CM | POA: Diagnosis not present

## 2019-05-25 DIAGNOSIS — M545 Low back pain: Secondary | ICD-10-CM | POA: Diagnosis not present

## 2019-05-25 DIAGNOSIS — M25561 Pain in right knee: Secondary | ICD-10-CM | POA: Diagnosis not present

## 2019-05-25 DIAGNOSIS — M542 Cervicalgia: Secondary | ICD-10-CM | POA: Diagnosis not present

## 2019-05-25 DIAGNOSIS — M25562 Pain in left knee: Secondary | ICD-10-CM | POA: Diagnosis not present

## 2019-05-26 ENCOUNTER — Encounter: Payer: Self-pay | Admitting: Family Medicine

## 2019-05-26 MED ORDER — HYDROCHLOROTHIAZIDE 25 MG PO TABS: 25.0000 mg | ORAL_TABLET | Freq: Every day | ORAL | 0 refills | Status: DC

## 2019-05-26 NOTE — Telephone Encounter (Signed)
I did go with something just a little less powerful especially based on her recent blood work where her creatinine was elevated and her BUN was elevated.  This can be from using too powerful diuretics.  New prescription sent to pharmacy.

## 2019-05-26 NOTE — Telephone Encounter (Signed)
On med list from historical provider  RX pended

## 2019-05-27 DIAGNOSIS — M6281 Muscle weakness (generalized): Secondary | ICD-10-CM | POA: Diagnosis not present

## 2019-05-27 DIAGNOSIS — M25562 Pain in left knee: Secondary | ICD-10-CM | POA: Diagnosis not present

## 2019-05-27 DIAGNOSIS — M542 Cervicalgia: Secondary | ICD-10-CM | POA: Diagnosis not present

## 2019-05-27 DIAGNOSIS — R262 Difficulty in walking, not elsewhere classified: Secondary | ICD-10-CM | POA: Diagnosis not present

## 2019-05-27 DIAGNOSIS — M545 Low back pain: Secondary | ICD-10-CM | POA: Diagnosis not present

## 2019-05-27 DIAGNOSIS — M25561 Pain in right knee: Secondary | ICD-10-CM | POA: Diagnosis not present

## 2019-06-01 DIAGNOSIS — M6281 Muscle weakness (generalized): Secondary | ICD-10-CM | POA: Diagnosis not present

## 2019-06-01 DIAGNOSIS — M542 Cervicalgia: Secondary | ICD-10-CM | POA: Diagnosis not present

## 2019-06-01 DIAGNOSIS — M545 Low back pain: Secondary | ICD-10-CM | POA: Diagnosis not present

## 2019-06-01 DIAGNOSIS — M25561 Pain in right knee: Secondary | ICD-10-CM | POA: Diagnosis not present

## 2019-06-01 DIAGNOSIS — R262 Difficulty in walking, not elsewhere classified: Secondary | ICD-10-CM | POA: Diagnosis not present

## 2019-06-01 DIAGNOSIS — M25562 Pain in left knee: Secondary | ICD-10-CM | POA: Diagnosis not present

## 2019-06-03 DIAGNOSIS — M545 Low back pain: Secondary | ICD-10-CM | POA: Diagnosis not present

## 2019-06-03 DIAGNOSIS — R262 Difficulty in walking, not elsewhere classified: Secondary | ICD-10-CM | POA: Diagnosis not present

## 2019-06-03 DIAGNOSIS — M542 Cervicalgia: Secondary | ICD-10-CM | POA: Diagnosis not present

## 2019-06-03 DIAGNOSIS — M6281 Muscle weakness (generalized): Secondary | ICD-10-CM | POA: Diagnosis not present

## 2019-06-03 DIAGNOSIS — M25562 Pain in left knee: Secondary | ICD-10-CM | POA: Diagnosis not present

## 2019-06-03 DIAGNOSIS — M25561 Pain in right knee: Secondary | ICD-10-CM | POA: Diagnosis not present

## 2019-06-08 DIAGNOSIS — M25562 Pain in left knee: Secondary | ICD-10-CM | POA: Diagnosis not present

## 2019-06-08 DIAGNOSIS — M542 Cervicalgia: Secondary | ICD-10-CM | POA: Diagnosis not present

## 2019-06-08 DIAGNOSIS — R262 Difficulty in walking, not elsewhere classified: Secondary | ICD-10-CM | POA: Diagnosis not present

## 2019-06-08 DIAGNOSIS — M6281 Muscle weakness (generalized): Secondary | ICD-10-CM | POA: Diagnosis not present

## 2019-06-08 DIAGNOSIS — M545 Low back pain: Secondary | ICD-10-CM | POA: Diagnosis not present

## 2019-06-08 DIAGNOSIS — M25561 Pain in right knee: Secondary | ICD-10-CM | POA: Diagnosis not present

## 2019-06-10 DIAGNOSIS — R262 Difficulty in walking, not elsewhere classified: Secondary | ICD-10-CM | POA: Diagnosis not present

## 2019-06-10 DIAGNOSIS — M25562 Pain in left knee: Secondary | ICD-10-CM | POA: Diagnosis not present

## 2019-06-10 DIAGNOSIS — M542 Cervicalgia: Secondary | ICD-10-CM | POA: Diagnosis not present

## 2019-06-10 DIAGNOSIS — M25561 Pain in right knee: Secondary | ICD-10-CM | POA: Diagnosis not present

## 2019-06-10 DIAGNOSIS — M6281 Muscle weakness (generalized): Secondary | ICD-10-CM | POA: Diagnosis not present

## 2019-06-10 DIAGNOSIS — M545 Low back pain: Secondary | ICD-10-CM | POA: Diagnosis not present

## 2019-06-15 ENCOUNTER — Other Ambulatory Visit: Payer: Self-pay | Admitting: Family Medicine

## 2019-06-15 DIAGNOSIS — G4700 Insomnia, unspecified: Secondary | ICD-10-CM

## 2019-06-17 DIAGNOSIS — M542 Cervicalgia: Secondary | ICD-10-CM | POA: Diagnosis not present

## 2019-06-17 DIAGNOSIS — M25561 Pain in right knee: Secondary | ICD-10-CM | POA: Diagnosis not present

## 2019-06-17 DIAGNOSIS — M25562 Pain in left knee: Secondary | ICD-10-CM | POA: Diagnosis not present

## 2019-06-17 DIAGNOSIS — M545 Low back pain: Secondary | ICD-10-CM | POA: Diagnosis not present

## 2019-06-17 DIAGNOSIS — R262 Difficulty in walking, not elsewhere classified: Secondary | ICD-10-CM | POA: Diagnosis not present

## 2019-06-17 DIAGNOSIS — M6281 Muscle weakness (generalized): Secondary | ICD-10-CM | POA: Diagnosis not present

## 2019-06-22 DIAGNOSIS — R262 Difficulty in walking, not elsewhere classified: Secondary | ICD-10-CM | POA: Diagnosis not present

## 2019-06-22 DIAGNOSIS — M542 Cervicalgia: Secondary | ICD-10-CM | POA: Diagnosis not present

## 2019-06-22 DIAGNOSIS — M25562 Pain in left knee: Secondary | ICD-10-CM | POA: Diagnosis not present

## 2019-06-22 DIAGNOSIS — M545 Low back pain: Secondary | ICD-10-CM | POA: Diagnosis not present

## 2019-06-22 DIAGNOSIS — M25561 Pain in right knee: Secondary | ICD-10-CM | POA: Diagnosis not present

## 2019-06-22 DIAGNOSIS — M6281 Muscle weakness (generalized): Secondary | ICD-10-CM | POA: Diagnosis not present

## 2019-06-24 ENCOUNTER — Other Ambulatory Visit: Payer: Self-pay | Admitting: Family Medicine

## 2019-06-24 DIAGNOSIS — M542 Cervicalgia: Secondary | ICD-10-CM | POA: Diagnosis not present

## 2019-06-24 DIAGNOSIS — M25561 Pain in right knee: Secondary | ICD-10-CM | POA: Diagnosis not present

## 2019-06-24 DIAGNOSIS — M25562 Pain in left knee: Secondary | ICD-10-CM | POA: Diagnosis not present

## 2019-06-24 DIAGNOSIS — M545 Low back pain: Secondary | ICD-10-CM | POA: Diagnosis not present

## 2019-06-24 DIAGNOSIS — R262 Difficulty in walking, not elsewhere classified: Secondary | ICD-10-CM | POA: Diagnosis not present

## 2019-06-24 DIAGNOSIS — M6281 Muscle weakness (generalized): Secondary | ICD-10-CM | POA: Diagnosis not present

## 2019-07-13 DIAGNOSIS — M25562 Pain in left knee: Secondary | ICD-10-CM | POA: Diagnosis not present

## 2019-07-26 ENCOUNTER — Other Ambulatory Visit: Payer: Self-pay | Admitting: Family Medicine

## 2019-08-03 ENCOUNTER — Encounter: Payer: Self-pay | Admitting: Family Medicine

## 2019-08-03 DIAGNOSIS — N1831 Chronic kidney disease, stage 3a: Secondary | ICD-10-CM

## 2019-08-03 DIAGNOSIS — I10 Essential (primary) hypertension: Secondary | ICD-10-CM

## 2019-08-03 DIAGNOSIS — E119 Type 2 diabetes mellitus without complications: Secondary | ICD-10-CM

## 2019-08-03 DIAGNOSIS — E785 Hyperlipidemia, unspecified: Secondary | ICD-10-CM

## 2019-08-03 NOTE — Telephone Encounter (Signed)
Last office visit states: "Be due for labs at the end of May/early June."  Please advise on what labs are needed  A1C and BMET done in February

## 2019-08-18 ENCOUNTER — Other Ambulatory Visit: Payer: Self-pay | Admitting: Family Medicine

## 2019-08-27 DIAGNOSIS — N1831 Chronic kidney disease, stage 3a: Secondary | ICD-10-CM | POA: Diagnosis not present

## 2019-08-27 DIAGNOSIS — E785 Hyperlipidemia, unspecified: Secondary | ICD-10-CM | POA: Diagnosis not present

## 2019-08-27 DIAGNOSIS — E119 Type 2 diabetes mellitus without complications: Secondary | ICD-10-CM | POA: Diagnosis not present

## 2019-08-27 DIAGNOSIS — I10 Essential (primary) hypertension: Secondary | ICD-10-CM | POA: Diagnosis not present

## 2019-08-28 LAB — HEMOGLOBIN A1C
Hgb A1c MFr Bld: 5.7 % of total Hgb — ABNORMAL HIGH (ref <5.7)
Mean Plasma Glucose: 117 (calc)
eAG (mmol/L): 6.5 (calc)

## 2019-08-28 LAB — COMPLETE METABOLIC PANEL WITH GFR
AG Ratio: 1.8 (calc) (ref 1.0–2.5)
ALT: 10 U/L (ref 6–29)
AST: 15 U/L (ref 10–35)
Albumin: 4.4 g/dL (ref 3.6–5.1)
Alkaline phosphatase (APISO): 85 U/L (ref 37–153)
BUN/Creatinine Ratio: 19 (calc) (ref 6–22)
BUN: 19 mg/dL (ref 7–25)
CO2: 27 mmol/L (ref 20–32)
Calcium: 9.3 mg/dL (ref 8.6–10.4)
Chloride: 106 mmol/L (ref 98–110)
Creat: 1 mg/dL — ABNORMAL HIGH (ref 0.50–0.99)
GFR, Est African American: 68 mL/min/{1.73_m2} (ref >=60)
GFR, Est Non African American: 59 mL/min/{1.73_m2} — ABNORMAL LOW (ref >=60)
Globulin: 2.4 g/dL (calc) (ref 1.9–3.7)
Glucose, Bld: 103 mg/dL (ref 65–139)
Potassium: 4 mmol/L (ref 3.5–5.3)
Sodium: 142 mmol/L (ref 135–146)
Total Bilirubin: 0.4 mg/dL (ref 0.2–1.2)
Total Protein: 6.8 g/dL (ref 6.1–8.1)

## 2019-08-28 LAB — LIPID PANEL W/REFLEX DIRECT LDL
Cholesterol: 264 mg/dL — ABNORMAL HIGH (ref <200)
HDL: 52 mg/dL (ref >=50)
LDL Cholesterol (Calc): 189 mg/dL (calc) — ABNORMAL HIGH
Non-HDL Cholesterol (Calc): 212 mg/dL (calc) — ABNORMAL HIGH (ref <130)
Total CHOL/HDL Ratio: 5.1 (calc) — ABNORMAL HIGH (ref <5.0)
Triglycerides: 107 mg/dL (ref <150)

## 2019-08-31 ENCOUNTER — Other Ambulatory Visit: Payer: Self-pay | Admitting: Family Medicine

## 2019-08-31 DIAGNOSIS — F331 Major depressive disorder, recurrent, moderate: Secondary | ICD-10-CM

## 2019-09-07 ENCOUNTER — Other Ambulatory Visit: Payer: Self-pay | Admitting: Family Medicine

## 2019-09-07 DIAGNOSIS — G4700 Insomnia, unspecified: Secondary | ICD-10-CM

## 2019-09-08 ENCOUNTER — Encounter: Payer: Self-pay | Admitting: Family Medicine

## 2019-09-08 ENCOUNTER — Other Ambulatory Visit: Payer: Self-pay | Admitting: Family Medicine

## 2019-09-08 NOTE — Telephone Encounter (Signed)
[place] for refer for [facility] counseling. We may need to make sure they are doing in person visit.

## 2019-09-17 DIAGNOSIS — M25562 Pain in left knee: Secondary | ICD-10-CM | POA: Diagnosis not present

## 2019-09-25 ENCOUNTER — Other Ambulatory Visit: Payer: Self-pay | Admitting: *Deleted

## 2019-09-25 MED ORDER — SERTRALINE HCL 100 MG PO TABS: 100.0000 mg | ORAL_TABLET | Freq: Every day | ORAL | 0 refills | Status: DC

## 2019-10-28 ENCOUNTER — Telehealth: Payer: Self-pay | Admitting: Family Medicine

## 2019-10-28 NOTE — Telephone Encounter (Signed)
PT recheduled from 10/29/19 to 11/19/19. PT said blood pressure medication needed to be refilled. Please advise.

## 2019-10-29 ENCOUNTER — Ambulatory Visit: Payer: Medicare HMO | Admitting: Family Medicine

## 2019-10-29 ENCOUNTER — Other Ambulatory Visit: Payer: Self-pay

## 2019-10-29 MED ORDER — TELMISARTAN 20 MG PO TABS: 20.0000 mg | ORAL_TABLET | Freq: Every day | ORAL | 0 refills | Status: DC

## 2019-10-29 NOTE — Telephone Encounter (Signed)
telmisartan

## 2019-10-29 NOTE — Telephone Encounter (Signed)
Task completed. A one month supply sent to pharmacy on file. Pls update the pt. Thanks in advance.

## 2019-10-29 NOTE — Telephone Encounter (Signed)
Need name of medication.

## 2019-11-15 ENCOUNTER — Other Ambulatory Visit: Payer: Self-pay | Admitting: Family Medicine

## 2019-11-19 ENCOUNTER — Ambulatory Visit (INDEPENDENT_AMBULATORY_CARE_PROVIDER_SITE_OTHER): Payer: Medicare HMO | Admitting: Family Medicine

## 2019-11-19 ENCOUNTER — Encounter: Payer: Self-pay | Admitting: Family Medicine

## 2019-11-19 VITALS — BP 131/72 | HR 93 | Ht 62.0 in | Wt 146.0 lb

## 2019-11-19 DIAGNOSIS — G4700 Insomnia, unspecified: Secondary | ICD-10-CM | POA: Diagnosis not present

## 2019-11-19 DIAGNOSIS — E119 Type 2 diabetes mellitus without complications: Secondary | ICD-10-CM

## 2019-11-19 DIAGNOSIS — I10 Essential (primary) hypertension: Secondary | ICD-10-CM | POA: Diagnosis not present

## 2019-11-19 DIAGNOSIS — N1831 Chronic kidney disease, stage 3a: Secondary | ICD-10-CM | POA: Diagnosis not present

## 2019-11-19 DIAGNOSIS — F331 Major depressive disorder, recurrent, moderate: Secondary | ICD-10-CM | POA: Diagnosis not present

## 2019-11-19 LAB — POCT GLYCOSYLATED HEMOGLOBIN (HGB A1C): Hemoglobin A1C: 6.3 % — AB (ref 4.0–5.6)

## 2019-11-19 MED ORDER — BELSOMRA 15 MG PO TABS: 15.0000 mg | ORAL_TABLET | Freq: Every day | ORAL | 0 refills | Status: DC

## 2019-11-19 MED ORDER — SERTRALINE HCL 100 MG PO TABS: 150.0000 mg | ORAL_TABLET | Freq: Every day | ORAL | 0 refills | Status: DC

## 2019-11-19 NOTE — Assessment & Plan Note (Signed)
Discussed options.  I really feel like she would benefit significantly from some therapy and counseling she has quite a few things from her childhood that she still really battling and now with the death of her brother who she really was not that close to has hit her hard.  Just really encouraged her to reach back back out and try to schedule an appointment.  She feels like she would probably need to go 2-3 times a week but says she does not know if she can afford that.  But I just encouraged her to go as much as she is able to I think this is really important.  We did discuss also the option of increasing her sertraline to 1-1/2 tabs daily at least for a month to see if this is helpful she says she is willing to try it.  New prescription sent to pharmacy.

## 2019-11-19 NOTE — Patient Instructions (Signed)
Recommend increase sertraline to 1-1/2 tabs daily.  Let us try this for a month and see if you feel like it is helpful still really encourage you to schedule an appointment with a therapist/counselor.

## 2019-11-19 NOTE — Assessment & Plan Note (Signed)
Ambien caused sleepwalking.   Trazodone was ineffective.   Lunesta called metallic taste in her mouth.    Melatonin helps some but she has to take 30 mg to get a . Doxepin was ineffective.   Recommend a trial of Belsomra.  Trial card given to try for 1 week to see if she feels like it is helpful start with 15 mg.  New prescription printed.

## 2019-11-19 NOTE — Assessment & Plan Note (Signed)
Well controlled. Continue current regimen. Follow up in  4 mo 

## 2019-11-19 NOTE — Assessment & Plan Note (Signed)
Well controlled. Continue current regimen. Follow up in  6 mo  

## 2019-11-19 NOTE — Progress Notes (Signed)
Established Patient Office Visit  Subjective:  Patient ID: Bianca Mckee, female    DOB: 12-03-1950  Age: 69 y.o. MRN: [id number]  CC:  Chief Complaint  Patient presents with  . Hypertension  . Diabetes    HPI [name] presents for   Hypertension- Pt denies chest pain, SOB, dizziness, or heart palpitations.  Taking meds as directed w/o problems.  Denies medication side effects.  She was previously on Maxide and just switched her to hydrochlorothiazide instead.  Her last renal function in June did look better at 1.0.  Diabetes - no hypoglycemic events. No wounds or sores that are not healing well. No increased thirst or urination. Checking glucose at home. Taking medications as prescribed without any side effects.  She still really just a lot of struggling with sleep quality.  Only getting up 3 to 4 hours at night.  She tried Ambien in the past but it caused her to sleep a lot.  Lunesta caused a very strong metallic taste in her mouth.  Trazodone was ineffective.  She said she tried taking 10 mg of melatonin it was not helpful so she actually went up to 30 mg and says that does seem to help a little bit.  She has not trialed both Belsomra or Rozerem.  Also reports she is just been under a lot of stress.  Her brother died on 06-16-2022 and she said it just really hit her hard last night.  Even though they were not very close but she is having to take care of his affairs and his son has not been helpful.  She also says that she is lost a couple of close friends who just really have not been engaging with her and she is not sure why or what may have happened and work has been stressful.  She said she did reach out to do some therapy or counseling over the summer but was almost a 77-month wait to get an appointment so she just did not make one.  She still taking her sertraline and Wellbutrin.    Past Medical History:  Diagnosis Date  . Anxiety   . GERD (gastroesophageal reflux disease)    . Hypertension 1996  . Vaginal Pap smear, abnormal     Past Surgical History:  Procedure Laterality Date  . CERVIX LESION DESTRUCTION    . TUBAL LIGATION      Family History  Problem Relation Age of Onset  . Lung cancer Brother 41       smoker  . Pancreatic cancer Brother   . Pancreatic cancer Brother   . Lung cancer Brother     Social History   Socioeconomic History  . Marital status: Divorced    Spouse name: Not on file  . Number of children: 1  . Years of education: 29  . Highest education level: 12th grade  Occupational History  . Not on file  Tobacco Use  . Smoking status: Never Smoker  . Smokeless tobacco: Never Used  Vaping Use  . Vaping Use: Never used  Substance and Sexual Activity  . Alcohol use: Yes    Alcohol/week: 1.0 standard drink    Types: 1 Shots of liquor per week    Comment: occasionally  . Drug use: No  . Sexual activity: Yes    Birth control/protection: None  Other Topics Concern  . Not on file  Social History Narrative   Still working at [employer] in [place].   Social  Determinants of Health   Financial Resource Strain: Low Risk   . Difficulty of Paying Living Expenses: Not hard at all  Food Insecurity: No Food Insecurity  . Worried About Charity fundraiser in the Last Year: Never true  . Ran Out of Food in the Last Year: Never true  Transportation Needs: No Transportation Needs  . Lack of Transportation (Medical): No  . Lack of Transportation (Non-Medical): No  Physical Activity: Inactive  . Days of Exercise per Week: 0 days  . Minutes of Exercise per Session: 0 min  Stress: No Stress Concern Present  . Feeling of Stress : Not at all  Social Connections: Socially Isolated  . Frequency of Communication with Friends and Family: Twice a week  . Frequency of Social Gatherings with Friends and Family: Never  . Attends Religious Services: Never  . Active Member of Clubs or Organizations: No  . Attends Archivist  Meetings: Never  . Marital Status: Divorced  Human resources officer Violence: Not At Risk  . Fear of Current or Ex-Partner: No  . Emotionally Abused: No  . Physically Abused: No  . Sexually Abused: No    Outpatient Medications Prior to Visit  Medication Sig Dispense Refill  . ALPRAZolam (XANAX) 1 MG tablet TAKE 1/2 TO 1 TABLET BY MOUTH DAILY AS NEEDED FOR ANXIETY 30 tablet 1  . atorvastatin (LIPITOR) 40 MG tablet TAKE 1 TABLET BY MOUTH EVERYDAY AT BEDTIME 90 tablet 1  . buPROPion (WELLBUTRIN XL) 150 MG 24 hr tablet TAKE 1 TABLET BY MOUTH EVERY DAY 90 tablet 1  . diclofenac Sodium (VOLTAREN) 1 % GEL APPLY 4 G TOPICALLY 4 (FOUR) TIMES DAILY. 400 g 1  . hydrochlorothiazide (HYDRODIURIL) 25 MG tablet TAKE 1 TABLET BY MOUTH EVERY DAY 90 tablet 0  . hydrOXYzine (ATARAX/VISTARIL) 25 MG tablet TAKE 1 TABLET BY MOUTH THREE TIMES A DAY AS NEEDED 30 tablet 0  . linaclotide (LINZESS) 145 MCG CAPS capsule Take 1 capsule (145 mcg total) by mouth daily. 90 capsule 3  . Melatonin 10 MG TABS Take 10 mg by mouth at bedtime as needed.    . metFORMIN (GLUCOPHAGE) 1000 MG tablet TAKE 1 TABLET (1,000 MG TOTAL) BY MOUTH 2 (TWO) TIMES DAILY WITH A MEAL. 180 tablet 1  . omeprazole (PRILOSEC) 40 MG capsule TAKE 1 CAPSULE BY MOUTH EVERY DAY 90 capsule 1  . telmisartan (MICARDIS) 20 MG tablet Take 1 tablet (20 mg total) by mouth daily. 30 tablet 0  . doxepin (SINEQUAN) 10 MG capsule TAKE 1 CAPSULE BY MOUTH AT BEDTIME 90 capsule 2  . sertraline (ZOLOFT) 100 MG tablet Take 1 tablet (100 mg total) by mouth daily. 90 tablet 0  . triamterene-hydrochlorothiazide (MAXZIDE) 75-50 MG tablet Take 1 tablet by mouth daily.     No facility-administered medications prior to visit.    Allergies  Allergen Reactions  . Ambien [Zolpidem Tartrate] Other (See Comments)    Sleep walking, injury  . Codeine Phosphate   . Eszopiclone Other (See Comments)    Metallic taste    ROS Review of Systems    Objective:    Physical  Exam Constitutional:      Appearance: She is well-developed.  HENT:     Head: Normocephalic and atraumatic.  Cardiovascular:     Rate and Rhythm: Normal rate and regular rhythm.     Heart sounds: Normal heart sounds.  Pulmonary:     Effort: Pulmonary effort is normal.     Breath sounds: Normal  breath sounds.  Skin:    General: Skin is warm and dry.  Neurological:     Mental Status: She is alert and oriented to person, place, and time.  Psychiatric:        Behavior: Behavior normal.     BP 131/72   Pulse 93   Ht 5\' 2"  (1.575 m)   Wt 146 lb (66.2 kg)   SpO2 99%   BMI 26.70 kg/m  Wt Readings from Last 3 Encounters:  11/19/19 146 lb (66.2 kg)  04/30/19 141 lb (64 kg)  12/11/18 147 lb (66.7 kg)     There are no preventive care reminders to display for this patient.  There are no preventive care reminders to display for this patient.  Lab Results  Component Value Date   TSH 1.754 04/17/2013   Lab Results  Component Value Date   WBC 10.5 11/03/2007   HGB 14.0 11/03/2007   HCT 43.6 11/03/2007   MCV 94.8 11/03/2007   PLT 394 11/03/2007   Lab Results  Component Value Date   NA 142 08/27/2019   K 4.0 08/27/2019   CO2 27 08/27/2019   GLUCOSE 103 08/27/2019   BUN 19 08/27/2019   CREATININE 1.00 (H) 08/27/2019   BILITOT 0.4 08/27/2019   ALKPHOS 71 01/05/2016   AST 15 08/27/2019   ALT 10 08/27/2019   PROT 6.8 08/27/2019   ALBUMIN 4.4 01/05/2016   CALCIUM 9.3 08/27/2019   Lab Results  Component Value Date   CHOL 264 (H) 08/27/2019   Lab Results  Component Value Date   HDL 52 08/27/2019   Lab Results  Component Value Date   LDLCALC 189 (H) 08/27/2019   Lab Results  Component Value Date   TRIG 107 08/27/2019   Lab Results  Component Value Date   CHOLHDL 5.1 (H) 08/27/2019   Lab Results  Component Value Date   HGBA1C 6.3 (A) 11/19/2019      Assessment & Plan:   Problem List Items Addressed This Visit      Cardiovascular and Mediastinum    HYPERTENSION, BENIGN SYSTEMIC    Well controlled. Continue current regimen. Follow up in  6 mo        Endocrine   Diabetes mellitus without complication ([place]) - Primary    Well controlled. Continue current regimen. Follow up in  4 mo      Relevant Orders   POCT glycosylated hemoglobin (Hb A1C) (Completed)     Genitourinary   CKD stage G3a/A1, GFR 45-59 and albumin creatinine ratio <30 mg/g     Other   Major depressive disorder, recurrent episode ([place])    Discussed options.  I really feel like she would benefit significantly from some therapy and counseling she has quite a few things from her childhood that she still really battling and now with the death of her brother who she really was not that close to has hit her hard.  Just really encouraged her to reach back back out and try to schedule an appointment.  She feels like she would probably need to go 2-3 times a week but says she does not know if she can afford that.  But I just encouraged her to go as much as she is able to I think this is really important.  We did discuss also the option of increasing her sertraline to 1-1/2 tabs daily at least for a month to see if this is helpful she says she is willing to try it.  New prescription sent to pharmacy.      Relevant Medications   sertraline (ZOLOFT) 100 MG tablet   INSOMNIA NOS    Ambien caused sleepwalking.   Trazodone was ineffective.   Lunesta called metallic taste in her mouth.    Melatonin helps some but she has to take 30 mg to get a . Doxepin was ineffective.   Recommend a trial of Belsomra.  Trial card given to try for 1 week to see if she feels like it is helpful start with 15 mg.  New prescription printed.      Relevant Medications   Suvorexant (BELSOMRA) 15 MG TABS      Lab Results  Component Value Date   HGBA1C 6.3 (A) 11/19/2019     Meds ordered this encounter  Medications  . Suvorexant (BELSOMRA) 15 MG TABS    Sig: Take 15 mg by mouth at bedtime.     Dispense:  10 tablet    Refill:  0  . sertraline (ZOLOFT) 100 MG tablet    Sig: Take 1.5 tablets (150 mg total) by mouth daily.    Dispense:  135 tablet    Refill:  0    Follow-up: Return in about 4 months (around 03/20/2020) for Hypertension and Diabetes and sleep .    Beatrice Lecher, MD

## 2019-11-26 ENCOUNTER — Other Ambulatory Visit: Payer: Self-pay | Admitting: Family Medicine

## 2019-12-09 ENCOUNTER — Other Ambulatory Visit: Payer: Self-pay | Admitting: Family Medicine

## 2019-12-09 DIAGNOSIS — G4700 Insomnia, unspecified: Secondary | ICD-10-CM

## 2019-12-18 DIAGNOSIS — M1712 Unilateral primary osteoarthritis, left knee: Secondary | ICD-10-CM | POA: Diagnosis not present

## 2019-12-18 DIAGNOSIS — M170 Bilateral primary osteoarthritis of knee: Secondary | ICD-10-CM | POA: Diagnosis not present

## 2019-12-18 DIAGNOSIS — M79644 Pain in right finger(s): Secondary | ICD-10-CM | POA: Diagnosis not present

## 2019-12-27 ENCOUNTER — Other Ambulatory Visit: Payer: Self-pay | Admitting: Family Medicine

## 2019-12-27 DIAGNOSIS — F331 Major depressive disorder, recurrent, moderate: Secondary | ICD-10-CM

## 2020-01-11 DIAGNOSIS — N871 Moderate cervical dysplasia: Secondary | ICD-10-CM | POA: Diagnosis not present

## 2020-01-11 DIAGNOSIS — Z79899 Other long term (current) drug therapy: Secondary | ICD-10-CM | POA: Diagnosis not present

## 2020-01-12 DIAGNOSIS — Z79899 Other long term (current) drug therapy: Secondary | ICD-10-CM | POA: Diagnosis not present

## 2020-01-12 DIAGNOSIS — N871 Moderate cervical dysplasia: Secondary | ICD-10-CM | POA: Diagnosis not present

## 2020-01-25 DIAGNOSIS — N871 Moderate cervical dysplasia: Secondary | ICD-10-CM | POA: Diagnosis not present

## 2020-01-25 DIAGNOSIS — I129 Hypertensive chronic kidney disease with stage 1 through stage 4 chronic kidney disease, or unspecified chronic kidney disease: Secondary | ICD-10-CM | POA: Diagnosis not present

## 2020-01-25 DIAGNOSIS — R87811 Vaginal high risk human papillomavirus (HPV) DNA test positive: Secondary | ICD-10-CM | POA: Diagnosis not present

## 2020-01-25 DIAGNOSIS — R87810 Cervical high risk human papillomavirus (HPV) DNA test positive: Secondary | ICD-10-CM | POA: Diagnosis not present

## 2020-01-25 DIAGNOSIS — N189 Chronic kidney disease, unspecified: Secondary | ICD-10-CM | POA: Diagnosis not present

## 2020-01-25 DIAGNOSIS — R87620 Atypical squamous cells of undetermined significance on cytologic smear of vagina (ASC-US): Secondary | ICD-10-CM | POA: Diagnosis not present

## 2020-01-25 DIAGNOSIS — N891 Moderate vaginal dysplasia: Secondary | ICD-10-CM | POA: Diagnosis not present

## 2020-01-25 DIAGNOSIS — Z7984 Long term (current) use of oral hypoglycemic drugs: Secondary | ICD-10-CM | POA: Diagnosis not present

## 2020-01-25 DIAGNOSIS — Z48816 Encounter for surgical aftercare following surgery on the genitourinary system: Secondary | ICD-10-CM | POA: Diagnosis not present

## 2020-01-25 DIAGNOSIS — E1122 Type 2 diabetes mellitus with diabetic chronic kidney disease: Secondary | ICD-10-CM | POA: Diagnosis not present

## 2020-01-25 DIAGNOSIS — Z86718 Personal history of other venous thrombosis and embolism: Secondary | ICD-10-CM | POA: Diagnosis not present

## 2020-02-01 ENCOUNTER — Encounter: Payer: Self-pay | Admitting: Family Medicine

## 2020-02-01 ENCOUNTER — Other Ambulatory Visit: Payer: Self-pay | Admitting: *Deleted

## 2020-02-01 DIAGNOSIS — R87811 Vaginal high risk human papillomavirus (HPV) DNA test positive: Secondary | ICD-10-CM | POA: Diagnosis not present

## 2020-02-01 DIAGNOSIS — R87620 Atypical squamous cells of undetermined significance on cytologic smear of vagina (ASC-US): Secondary | ICD-10-CM | POA: Diagnosis not present

## 2020-02-01 DIAGNOSIS — E1122 Type 2 diabetes mellitus with diabetic chronic kidney disease: Secondary | ICD-10-CM | POA: Diagnosis not present

## 2020-02-01 DIAGNOSIS — N189 Chronic kidney disease, unspecified: Secondary | ICD-10-CM | POA: Diagnosis not present

## 2020-02-01 DIAGNOSIS — N871 Moderate cervical dysplasia: Secondary | ICD-10-CM | POA: Diagnosis not present

## 2020-02-01 DIAGNOSIS — Z48816 Encounter for surgical aftercare following surgery on the genitourinary system: Secondary | ICD-10-CM | POA: Diagnosis not present

## 2020-02-01 DIAGNOSIS — N891 Moderate vaginal dysplasia: Secondary | ICD-10-CM | POA: Diagnosis not present

## 2020-02-01 DIAGNOSIS — E119 Type 2 diabetes mellitus without complications: Secondary | ICD-10-CM

## 2020-02-01 DIAGNOSIS — Z7984 Long term (current) use of oral hypoglycemic drugs: Secondary | ICD-10-CM | POA: Diagnosis not present

## 2020-02-01 DIAGNOSIS — I129 Hypertensive chronic kidney disease with stage 1 through stage 4 chronic kidney disease, or unspecified chronic kidney disease: Secondary | ICD-10-CM | POA: Diagnosis not present

## 2020-02-01 DIAGNOSIS — Z86718 Personal history of other venous thrombosis and embolism: Secondary | ICD-10-CM | POA: Diagnosis not present

## 2020-02-01 DIAGNOSIS — R87810 Cervical high risk human papillomavirus (HPV) DNA test positive: Secondary | ICD-10-CM | POA: Diagnosis not present

## 2020-02-01 MED ORDER — GLUCOSE BLOOD VI STRP: ORAL_STRIP | 12 refills | Status: DC

## 2020-02-02 DIAGNOSIS — N891 Moderate vaginal dysplasia: Secondary | ICD-10-CM | POA: Insufficient documentation

## 2020-02-08 ENCOUNTER — Other Ambulatory Visit: Payer: Self-pay | Admitting: Family Medicine

## 2020-02-10 ENCOUNTER — Other Ambulatory Visit: Payer: Self-pay | Admitting: Family Medicine

## 2020-02-10 DIAGNOSIS — F331 Major depressive disorder, recurrent, moderate: Secondary | ICD-10-CM

## 2020-02-10 DIAGNOSIS — E119 Type 2 diabetes mellitus without complications: Secondary | ICD-10-CM | POA: Diagnosis not present

## 2020-02-10 DIAGNOSIS — I10 Essential (primary) hypertension: Secondary | ICD-10-CM | POA: Diagnosis not present

## 2020-02-10 DIAGNOSIS — N891 Moderate vaginal dysplasia: Secondary | ICD-10-CM | POA: Diagnosis not present

## 2020-02-10 DIAGNOSIS — Z79899 Other long term (current) drug therapy: Secondary | ICD-10-CM | POA: Diagnosis not present

## 2020-02-10 DIAGNOSIS — N893 Dysplasia of vagina, unspecified: Secondary | ICD-10-CM | POA: Diagnosis not present

## 2020-02-10 DIAGNOSIS — M1711 Unilateral primary osteoarthritis, right knee: Secondary | ICD-10-CM | POA: Diagnosis not present

## 2020-02-10 DIAGNOSIS — E1122 Type 2 diabetes mellitus with diabetic chronic kidney disease: Secondary | ICD-10-CM | POA: Diagnosis not present

## 2020-02-10 DIAGNOSIS — N189 Chronic kidney disease, unspecified: Secondary | ICD-10-CM | POA: Diagnosis not present

## 2020-02-10 DIAGNOSIS — Z7984 Long term (current) use of oral hypoglycemic drugs: Secondary | ICD-10-CM | POA: Diagnosis not present

## 2020-02-22 ENCOUNTER — Other Ambulatory Visit: Payer: Self-pay | Admitting: Family Medicine

## 2020-02-22 DIAGNOSIS — Z1231 Encounter for screening mammogram for malignant neoplasm of breast: Secondary | ICD-10-CM

## 2020-02-24 ENCOUNTER — Other Ambulatory Visit: Payer: Self-pay | Admitting: Family Medicine

## 2020-02-24 DIAGNOSIS — G4700 Insomnia, unspecified: Secondary | ICD-10-CM

## 2020-02-29 DIAGNOSIS — N891 Moderate vaginal dysplasia: Secondary | ICD-10-CM | POA: Diagnosis not present

## 2020-03-08 DIAGNOSIS — M19031 Primary osteoarthritis, right wrist: Secondary | ICD-10-CM | POA: Diagnosis not present

## 2020-03-08 DIAGNOSIS — M1712 Unilateral primary osteoarthritis, left knee: Secondary | ICD-10-CM | POA: Diagnosis not present

## 2020-03-19 HISTORY — PX: ARTHROPLASTY: SHX135

## 2020-03-29 ENCOUNTER — Ambulatory Visit: Payer: Medicare HMO | Admitting: Family Medicine

## 2020-04-01 DIAGNOSIS — M25562 Pain in left knee: Secondary | ICD-10-CM | POA: Diagnosis not present

## 2020-04-01 DIAGNOSIS — M25551 Pain in right hip: Secondary | ICD-10-CM | POA: Diagnosis not present

## 2020-04-01 DIAGNOSIS — M1712 Unilateral primary osteoarthritis, left knee: Secondary | ICD-10-CM | POA: Diagnosis not present

## 2020-04-08 ENCOUNTER — Encounter: Payer: Self-pay | Admitting: Family Medicine

## 2020-04-12 ENCOUNTER — Ambulatory Visit (INDEPENDENT_AMBULATORY_CARE_PROVIDER_SITE_OTHER): Payer: Medicare HMO | Admitting: Family Medicine

## 2020-04-12 ENCOUNTER — Other Ambulatory Visit: Payer: Self-pay

## 2020-04-12 VITALS — BP 131/78 | HR 92 | Ht 62.0 in | Wt 145.0 lb

## 2020-04-12 DIAGNOSIS — Z23 Encounter for immunization: Secondary | ICD-10-CM | POA: Diagnosis not present

## 2020-04-12 DIAGNOSIS — I10 Essential (primary) hypertension: Secondary | ICD-10-CM

## 2020-04-12 DIAGNOSIS — E119 Type 2 diabetes mellitus without complications: Secondary | ICD-10-CM | POA: Diagnosis not present

## 2020-04-12 DIAGNOSIS — N1831 Chronic kidney disease, stage 3a: Secondary | ICD-10-CM | POA: Insufficient documentation

## 2020-04-12 DIAGNOSIS — M1712 Unilateral primary osteoarthritis, left knee: Secondary | ICD-10-CM | POA: Diagnosis not present

## 2020-04-12 DIAGNOSIS — E1122 Type 2 diabetes mellitus with diabetic chronic kidney disease: Secondary | ICD-10-CM

## 2020-04-12 DIAGNOSIS — F331 Major depressive disorder, recurrent, moderate: Secondary | ICD-10-CM

## 2020-04-12 LAB — POCT GLYCOSYLATED HEMOGLOBIN (HGB A1C): Hemoglobin A1C: 6.3 % — AB (ref 4.0–5.6)

## 2020-04-12 MED ORDER — BLOOD GLUCOSE METER KIT: PACK | 0 refills | Status: AC

## 2020-04-12 NOTE — Assessment & Plan Note (Signed)
A1c actually looks good today at 6.1.  Continue current regimen we will go to get her new glucometer.  Otherwise follow-up in 3 to 4 months.  Exam performed today.  Will call for most recent eye exam.  Vaccine updated today.

## 2020-04-12 NOTE — Progress Notes (Signed)
Established Patient Office Visit  Subjective:  Patient ID: Bianca Mckee, female    DOB: 1951/01/15  Age: 70 y.o. MRN: [id number]  CC:  Chief Complaint  Patient presents with   Diabetes    HPI Bianca Mckee presents for   Diabetes - no hypoglycemic events. No wounds or sores that are not healing well. No increased thirst or urination. Checking glucose at home. Taking medications as prescribed without any side effects.  Exam is scheduled.  Foot exam performed today.     He says Bianca Mckee has not been nearly as active because of Bianca Mckee left knee pain. In fact Bianca Mckee is seen the orthopedist and they are discussing actually going forward with a knee replacement probably in March. Bianca Mckee says it is really severely limited Bianca Mckee activity levels Bianca Mckee basically just goes to work and goes to the store and that is about it. Bianca Mckee is not exercising.  Past Medical History:  Diagnosis Date   Anxiety    GERD (gastroesophageal reflux disease)    Hypertension 1996   Vaginal Pap smear, abnormal     Past Surgical History:  Procedure Laterality Date   CERVIX LESION DESTRUCTION     TUBAL LIGATION      Family History  Problem Relation Age of Onset   Lung cancer Brother 28       smoker   Pancreatic cancer Brother    Pancreatic cancer Brother    Lung cancer Brother     Social History   Socioeconomic History   Marital status: Divorced    Spouse name: Not on file   Number of children: 1   Years of education: 12   Highest education level: 12th grade  Occupational History   Not on file  Tobacco Use   Smoking status: Never Smoker   Smokeless tobacco: Never Used  Scientific laboratory technician Use: Never used  Substance and Sexual Activity   Alcohol use: Yes    Alcohol/week: 1.0 standard drink    Types: 1 Shots of liquor per week    Comment: occasionally   Drug use: No   Sexual activity: Yes    Birth control/protection: None  Other Topics Concern   Not on file  Social History  Narrative   Still working at [employer] in [place].   Social Determinants of Health   Financial Resource Strain: Not on file  Food Insecurity: Not on file  Transportation Needs: Not on file  Physical Activity: Not on file  Stress: Not on file  Social Connections: Not on file  Intimate Partner Violence: Not on file    Outpatient Medications Prior to Visit  Medication Sig Dispense Refill   ALPRAZolam (XANAX) 1 MG tablet TAKE 1/2 TO 1 TABLET BY MOUTH DAILY AS NEEDED FOR ANXIETY 30 tablet 1   atorvastatin (LIPITOR) 40 MG tablet TAKE 1 TABLET BY MOUTH EVERYDAY AT BEDTIME 90 tablet 1   buPROPion (WELLBUTRIN XL) 150 MG 24 hr tablet TAKE 1 TABLET BY MOUTH EVERY DAY 90 tablet 1   diclofenac Sodium (VOLTAREN) 1 % GEL APPLY 4 G TOPICALLY 4 (FOUR) TIMES DAILY. 400 g 1   hydrochlorothiazide (HYDRODIURIL) 25 MG tablet TAKE 1 TABLET BY MOUTH EVERY DAY 90 tablet 0   linaclotide (LINZESS) 145 MCG CAPS capsule Take 1 capsule (145 mcg total) by mouth daily. 90 capsule 3   Melatonin 10 MG TABS Take 10 mg by mouth at bedtime as needed.     metFORMIN (GLUCOPHAGE) 1000 MG tablet  TAKE 1 TABLET (1,000 MG TOTAL) BY MOUTH 2 (TWO) TIMES DAILY WITH A MEAL. 180 tablet 1   omeprazole (PRILOSEC) 40 MG capsule TAKE 1 CAPSULE BY MOUTH EVERY DAY 90 capsule 1   sertraline (ZOLOFT) 100 MG tablet TAKE 1.5 TABLETS BY MOUTH DAILY 135 tablet 0   telmisartan (MICARDIS) 20 MG tablet TAKE 1 TABLET BY MOUTH EVERY DAY 90 tablet 0   glucose blood test strip For testing blood sugars once daily. E11.9 100 each 12   hydrOXYzine (ATARAX/VISTARIL) 25 MG tablet TAKE 1 TABLET BY MOUTH THREE TIMES A DAY AS NEEDED 30 tablet 0   No facility-administered medications prior to visit.    Allergies  Allergen Reactions   Ambien [Zolpidem Tartrate] Other (See Comments)    Sleep walking, injury   Codeine Phosphate    Eszopiclone Other (See Comments)    Metallic taste    ROS Review of Systems    Objective:     Physical Exam Constitutional:      Appearance: Bianca Mckee is well-developed and well-nourished.  HENT:     Head: Normocephalic and atraumatic.  Cardiovascular:     Rate and Rhythm: Normal rate and regular rhythm.     Heart sounds: Normal heart sounds.  Pulmonary:     Effort: Pulmonary effort is normal.     Breath sounds: Normal breath sounds.  Skin:    General: Skin is warm and dry.  Neurological:     Mental Status: Bianca Mckee is alert and oriented to person, place, and time.  Psychiatric:        Mood and Affect: Mood and affect normal.        Behavior: Behavior normal.     BP 131/78    Pulse 92    Ht 5' 2"  (1.575 m)    Wt 145 lb (65.8 kg)    SpO2 99%    BMI 26.52 kg/m  Wt Readings from Last 3 Encounters:  04/13/20 145 lb (65.8 kg)  11/19/19 146 lb (66.2 kg)  04/30/19 141 lb (64 kg)     Health Maintenance Due  Topic Date Due   OPHTHALMOLOGY EXAM  07/25/2018   FOOT EXAM  12/11/2019   COVID-19 Vaccine (3 - Booster for Pfizer series) 12/18/2019    There are no preventive care reminders to display for this patient.  Lab Results  Component Value Date   TSH 1.754 04/17/2013   Lab Results  Component Value Date   WBC 10.5 11/03/2007   HGB 14.0 11/03/2007   HCT 43.6 11/03/2007   MCV 94.8 11/03/2007   PLT 394 11/03/2007   Lab Results  Component Value Date   NA 141 04/12/2020   K 3.6 04/12/2020   CO2 26 04/12/2020   GLUCOSE 86 04/12/2020   BUN 20 04/12/2020   CREATININE 0.88 04/12/2020   BILITOT 0.4 08/27/2019   ALKPHOS 71 01/05/2016   AST 15 08/27/2019   ALT 10 08/27/2019   PROT 6.8 08/27/2019   ALBUMIN 4.4 01/05/2016   CALCIUM 9.9 04/12/2020   Lab Results  Component Value Date   CHOL 264 (H) 08/27/2019   Lab Results  Component Value Date   HDL 52 08/27/2019   Lab Results  Component Value Date   LDLCALC 189 (H) 08/27/2019   Lab Results  Component Value Date   TRIG 107 08/27/2019   Lab Results  Component Value Date   CHOLHDL 5.1 (H) 08/27/2019   Lab  Results  Component Value Date   HGBA1C 6.3 (A) 04/12/2020  Assessment & Plan:   Problem List Items Addressed This Visit      Cardiovascular and Mediastinum   HYPERTENSION, BENIGN SYSTEMIC     Endocrine   Type 2 diabetes mellitus with stage 3a chronic kidney disease, without long-term current use of insulin (HCC)    A1c actually looks good today at 6.1.  Continue current regimen we will go to get Bianca Mckee new glucometer.  Otherwise follow-up in 3 to 4 months.  Exam performed today.  Will call for most recent eye exam.  Vaccine updated today.      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  Bianca Mckee will go to the lab today.      Relevant Orders   BASIC METABOLIC PANEL WITH GFR (Completed)     Other   Major depressive disorder, recurrent episode ([place])    Discussed switching Bianca Mckee Xanax to clonazepam when Bianca Mckee runs out.  We actually just refilled Bianca Mckee medication recently just reminded Bianca Mckee again to use it sparingly and warned about potential for dependency.  Bianca Mckee will let me know when Bianca Mckee is getting close to running out and we will switch it to clonazepam.       Other Visit Diagnoses    Need for prophylactic vaccination against Streptococcus pneumoniae (pneumococcus)       Relevant Orders   Pneumococcal polysaccharide vaccine 23-valent greater than or equal to 2yo subcutaneous/IM (Completed)   Primary osteoarthritis of left knee          Left knee osteoarthritis-likely scheduling for knee replacement in March. The orthopedic group can let us know if they need any type of preoperative clearance before then.  Meds ordered this encounter  Medications   blood glucose meter kit and supplies    Sig: Dispense based on  patient and insurance preference. Use to test blood sugar once daily. Dx: E11.9    Dispense:  1 each    Refill:  0    Order Specific Question:   Number of strips    Answer:   100    Order Specific Question:   Number of lancets    Answer:   100    Follow-up: Return in about 4 months (around 08/10/2020) for Diabetes follow-up.    Beatrice Lecher, MD

## 2020-04-12 NOTE — Assessment & Plan Note (Signed)
Discussed switching her Xanax to clonazepam when she runs out.  We actually just refilled her medication recently just reminded her again to use it sparingly and warned about potential for dependency.  She will let me know when she is getting close to running out and we will switch it to clonazepam.

## 2020-04-12 NOTE — Assessment & Plan Note (Signed)
To recheck renal function.  She will go to the lab today.

## 2020-04-13 ENCOUNTER — Encounter: Payer: Self-pay | Admitting: Family Medicine

## 2020-04-13 LAB — BASIC METABOLIC PANEL WITH GFR
BUN: 20 mg/dL (ref 7–25)
CO2: 26 mmol/L (ref 20–32)
Calcium: 9.9 mg/dL (ref 8.6–10.4)
Chloride: 104 mmol/L (ref 98–110)
Creat: 0.88 mg/dL (ref 0.50–0.99)
GFR, Est African American: 79 mL/min/{1.73_m2} (ref >=60)
GFR, Est Non African American: 68 mL/min/{1.73_m2} (ref >=60)
Glucose, Bld: 86 mg/dL (ref 65–99)
Potassium: 3.6 mmol/L (ref 3.5–5.3)
Sodium: 141 mmol/L (ref 135–146)

## 2020-04-15 DIAGNOSIS — R69 Illness, unspecified: Secondary | ICD-10-CM | POA: Diagnosis not present

## 2020-04-20 ENCOUNTER — Other Ambulatory Visit: Payer: Self-pay

## 2020-04-20 ENCOUNTER — Ambulatory Visit (INDEPENDENT_AMBULATORY_CARE_PROVIDER_SITE_OTHER): Payer: Medicare HMO

## 2020-04-20 DIAGNOSIS — Z1231 Encounter for screening mammogram for malignant neoplasm of breast: Secondary | ICD-10-CM | POA: Diagnosis not present

## 2020-05-02 ENCOUNTER — Other Ambulatory Visit: Payer: Self-pay | Admitting: Family Medicine

## 2020-05-03 ENCOUNTER — Other Ambulatory Visit: Payer: Self-pay | Admitting: Family Medicine

## 2020-05-03 DIAGNOSIS — F331 Major depressive disorder, recurrent, moderate: Secondary | ICD-10-CM

## 2020-05-04 ENCOUNTER — Encounter: Payer: Self-pay | Admitting: Family Medicine

## 2020-05-04 ENCOUNTER — Other Ambulatory Visit: Payer: Self-pay | Admitting: Family Medicine

## 2020-05-09 DIAGNOSIS — D3131 Benign neoplasm of right choroid: Secondary | ICD-10-CM | POA: Diagnosis not present

## 2020-05-09 DIAGNOSIS — H35033 Hypertensive retinopathy, bilateral: Secondary | ICD-10-CM | POA: Diagnosis not present

## 2020-05-09 DIAGNOSIS — H2513 Age-related nuclear cataract, bilateral: Secondary | ICD-10-CM | POA: Diagnosis not present

## 2020-05-09 DIAGNOSIS — E119 Type 2 diabetes mellitus without complications: Secondary | ICD-10-CM | POA: Diagnosis not present

## 2020-05-09 LAB — HM DIABETES EYE EXAM

## 2020-05-12 DIAGNOSIS — E119 Type 2 diabetes mellitus without complications: Secondary | ICD-10-CM | POA: Diagnosis not present

## 2020-05-12 DIAGNOSIS — I10 Essential (primary) hypertension: Secondary | ICD-10-CM | POA: Diagnosis not present

## 2020-05-12 DIAGNOSIS — Z01812 Encounter for preprocedural laboratory examination: Secondary | ICD-10-CM | POA: Diagnosis not present

## 2020-05-12 DIAGNOSIS — Z79890 Hormone replacement therapy (postmenopausal): Secondary | ICD-10-CM | POA: Diagnosis not present

## 2020-05-12 DIAGNOSIS — M1712 Unilateral primary osteoarthritis, left knee: Secondary | ICD-10-CM | POA: Diagnosis not present

## 2020-05-12 DIAGNOSIS — Z79899 Other long term (current) drug therapy: Secondary | ICD-10-CM | POA: Diagnosis not present

## 2020-05-12 DIAGNOSIS — Z01818 Encounter for other preprocedural examination: Secondary | ICD-10-CM | POA: Diagnosis not present

## 2020-05-12 DIAGNOSIS — K5909 Other constipation: Secondary | ICD-10-CM | POA: Diagnosis not present

## 2020-05-12 DIAGNOSIS — F419 Anxiety disorder, unspecified: Secondary | ICD-10-CM | POA: Diagnosis not present

## 2020-05-12 DIAGNOSIS — R9431 Abnormal electrocardiogram [ECG] [EKG]: Secondary | ICD-10-CM | POA: Diagnosis not present

## 2020-05-12 DIAGNOSIS — E785 Hyperlipidemia, unspecified: Secondary | ICD-10-CM | POA: Diagnosis not present

## 2020-05-12 DIAGNOSIS — N891 Moderate vaginal dysplasia: Secondary | ICD-10-CM | POA: Diagnosis not present

## 2020-05-12 DIAGNOSIS — Z01810 Encounter for preprocedural cardiovascular examination: Secondary | ICD-10-CM | POA: Diagnosis not present

## 2020-05-17 ENCOUNTER — Other Ambulatory Visit: Payer: Self-pay | Admitting: Family Medicine

## 2020-05-20 DIAGNOSIS — Z20822 Contact with and (suspected) exposure to covid-19: Secondary | ICD-10-CM | POA: Diagnosis not present

## 2020-05-20 DIAGNOSIS — Z01812 Encounter for preprocedural laboratory examination: Secondary | ICD-10-CM | POA: Diagnosis not present

## 2020-05-23 DIAGNOSIS — Z96652 Presence of left artificial knee joint: Secondary | ICD-10-CM | POA: Diagnosis not present

## 2020-05-23 DIAGNOSIS — I129 Hypertensive chronic kidney disease with stage 1 through stage 4 chronic kidney disease, or unspecified chronic kidney disease: Secondary | ICD-10-CM | POA: Diagnosis not present

## 2020-05-23 DIAGNOSIS — N1831 Chronic kidney disease, stage 3a: Secondary | ICD-10-CM | POA: Diagnosis not present

## 2020-05-23 DIAGNOSIS — R2681 Unsteadiness on feet: Secondary | ICD-10-CM | POA: Diagnosis not present

## 2020-05-23 DIAGNOSIS — Z471 Aftercare following joint replacement surgery: Secondary | ICD-10-CM | POA: Diagnosis not present

## 2020-05-23 DIAGNOSIS — E785 Hyperlipidemia, unspecified: Secondary | ICD-10-CM | POA: Diagnosis not present

## 2020-05-23 DIAGNOSIS — M25551 Pain in right hip: Secondary | ICD-10-CM | POA: Diagnosis not present

## 2020-05-23 DIAGNOSIS — K219 Gastro-esophageal reflux disease without esophagitis: Secondary | ICD-10-CM | POA: Diagnosis not present

## 2020-05-23 DIAGNOSIS — E1122 Type 2 diabetes mellitus with diabetic chronic kidney disease: Secondary | ICD-10-CM | POA: Diagnosis not present

## 2020-05-23 DIAGNOSIS — M1712 Unilateral primary osteoarthritis, left knee: Secondary | ICD-10-CM | POA: Diagnosis not present

## 2020-05-23 DIAGNOSIS — M25552 Pain in left hip: Secondary | ICD-10-CM | POA: Diagnosis not present

## 2020-05-23 DIAGNOSIS — Z7984 Long term (current) use of oral hypoglycemic drugs: Secondary | ICD-10-CM | POA: Diagnosis not present

## 2020-05-23 DIAGNOSIS — G8918 Other acute postprocedural pain: Secondary | ICD-10-CM | POA: Diagnosis not present

## 2020-05-24 DIAGNOSIS — I129 Hypertensive chronic kidney disease with stage 1 through stage 4 chronic kidney disease, or unspecified chronic kidney disease: Secondary | ICD-10-CM | POA: Diagnosis not present

## 2020-05-24 DIAGNOSIS — M25551 Pain in right hip: Secondary | ICD-10-CM | POA: Diagnosis not present

## 2020-05-24 DIAGNOSIS — M1712 Unilateral primary osteoarthritis, left knee: Secondary | ICD-10-CM | POA: Diagnosis not present

## 2020-05-24 DIAGNOSIS — N1831 Chronic kidney disease, stage 3a: Secondary | ICD-10-CM | POA: Diagnosis not present

## 2020-05-24 DIAGNOSIS — M25552 Pain in left hip: Secondary | ICD-10-CM | POA: Diagnosis not present

## 2020-05-24 DIAGNOSIS — Z7984 Long term (current) use of oral hypoglycemic drugs: Secondary | ICD-10-CM | POA: Diagnosis not present

## 2020-05-24 DIAGNOSIS — E1122 Type 2 diabetes mellitus with diabetic chronic kidney disease: Secondary | ICD-10-CM | POA: Diagnosis not present

## 2020-05-24 DIAGNOSIS — E785 Hyperlipidemia, unspecified: Secondary | ICD-10-CM | POA: Diagnosis not present

## 2020-05-24 DIAGNOSIS — R2681 Unsteadiness on feet: Secondary | ICD-10-CM | POA: Diagnosis not present

## 2020-05-25 DIAGNOSIS — M25551 Pain in right hip: Secondary | ICD-10-CM | POA: Diagnosis not present

## 2020-05-25 DIAGNOSIS — M1732 Unilateral post-traumatic osteoarthritis, left knee: Secondary | ICD-10-CM | POA: Diagnosis not present

## 2020-05-25 DIAGNOSIS — M25552 Pain in left hip: Secondary | ICD-10-CM | POA: Diagnosis not present

## 2020-05-25 DIAGNOSIS — F418 Other specified anxiety disorders: Secondary | ICD-10-CM | POA: Diagnosis not present

## 2020-05-25 DIAGNOSIS — N1831 Chronic kidney disease, stage 3a: Secondary | ICD-10-CM | POA: Diagnosis not present

## 2020-05-25 DIAGNOSIS — E119 Type 2 diabetes mellitus without complications: Secondary | ICD-10-CM | POA: Diagnosis not present

## 2020-05-25 DIAGNOSIS — Z471 Aftercare following joint replacement surgery: Secondary | ICD-10-CM | POA: Diagnosis not present

## 2020-05-25 DIAGNOSIS — K219 Gastro-esophageal reflux disease without esophagitis: Secondary | ICD-10-CM | POA: Diagnosis not present

## 2020-05-25 DIAGNOSIS — F419 Anxiety disorder, unspecified: Secondary | ICD-10-CM | POA: Diagnosis not present

## 2020-05-25 DIAGNOSIS — I10 Essential (primary) hypertension: Secondary | ICD-10-CM | POA: Diagnosis not present

## 2020-05-25 DIAGNOSIS — Z7984 Long term (current) use of oral hypoglycemic drugs: Secondary | ICD-10-CM | POA: Diagnosis not present

## 2020-05-25 DIAGNOSIS — R600 Localized edema: Secondary | ICD-10-CM | POA: Diagnosis not present

## 2020-05-25 DIAGNOSIS — Z96652 Presence of left artificial knee joint: Secondary | ICD-10-CM | POA: Diagnosis not present

## 2020-05-25 DIAGNOSIS — I129 Hypertensive chronic kidney disease with stage 1 through stage 4 chronic kidney disease, or unspecified chronic kidney disease: Secondary | ICD-10-CM | POA: Diagnosis not present

## 2020-05-25 DIAGNOSIS — R2681 Unsteadiness on feet: Secondary | ICD-10-CM | POA: Diagnosis not present

## 2020-05-25 DIAGNOSIS — E1122 Type 2 diabetes mellitus with diabetic chronic kidney disease: Secondary | ICD-10-CM | POA: Diagnosis not present

## 2020-05-25 DIAGNOSIS — F32A Depression, unspecified: Secondary | ICD-10-CM | POA: Diagnosis not present

## 2020-05-25 DIAGNOSIS — E785 Hyperlipidemia, unspecified: Secondary | ICD-10-CM | POA: Diagnosis not present

## 2020-05-25 DIAGNOSIS — E1169 Type 2 diabetes mellitus with other specified complication: Secondary | ICD-10-CM | POA: Diagnosis not present

## 2020-05-25 DIAGNOSIS — Z96653 Presence of artificial knee joint, bilateral: Secondary | ICD-10-CM | POA: Diagnosis not present

## 2020-05-25 DIAGNOSIS — G4700 Insomnia, unspecified: Secondary | ICD-10-CM | POA: Diagnosis not present

## 2020-05-25 DIAGNOSIS — M1712 Unilateral primary osteoarthritis, left knee: Secondary | ICD-10-CM | POA: Diagnosis not present

## 2020-05-25 DIAGNOSIS — K5901 Slow transit constipation: Secondary | ICD-10-CM | POA: Diagnosis not present

## 2020-05-26 DIAGNOSIS — F419 Anxiety disorder, unspecified: Secondary | ICD-10-CM | POA: Diagnosis not present

## 2020-05-26 DIAGNOSIS — F32A Depression, unspecified: Secondary | ICD-10-CM | POA: Diagnosis not present

## 2020-05-26 DIAGNOSIS — M1712 Unilateral primary osteoarthritis, left knee: Secondary | ICD-10-CM | POA: Diagnosis not present

## 2020-05-26 DIAGNOSIS — I10 Essential (primary) hypertension: Secondary | ICD-10-CM | POA: Diagnosis not present

## 2020-05-26 DIAGNOSIS — R600 Localized edema: Secondary | ICD-10-CM | POA: Diagnosis not present

## 2020-05-26 DIAGNOSIS — E119 Type 2 diabetes mellitus without complications: Secondary | ICD-10-CM | POA: Diagnosis not present

## 2020-05-26 DIAGNOSIS — K5901 Slow transit constipation: Secondary | ICD-10-CM | POA: Diagnosis not present

## 2020-05-26 DIAGNOSIS — E785 Hyperlipidemia, unspecified: Secondary | ICD-10-CM | POA: Diagnosis not present

## 2020-05-26 DIAGNOSIS — Z96653 Presence of artificial knee joint, bilateral: Secondary | ICD-10-CM | POA: Diagnosis not present

## 2020-05-30 DIAGNOSIS — Z96652 Presence of left artificial knee joint: Secondary | ICD-10-CM | POA: Diagnosis not present

## 2020-05-30 DIAGNOSIS — I10 Essential (primary) hypertension: Secondary | ICD-10-CM | POA: Diagnosis not present

## 2020-05-30 DIAGNOSIS — K5901 Slow transit constipation: Secondary | ICD-10-CM | POA: Diagnosis not present

## 2020-05-30 DIAGNOSIS — M1712 Unilateral primary osteoarthritis, left knee: Secondary | ICD-10-CM | POA: Diagnosis not present

## 2020-05-30 DIAGNOSIS — E119 Type 2 diabetes mellitus without complications: Secondary | ICD-10-CM | POA: Diagnosis not present

## 2020-06-02 DIAGNOSIS — E119 Type 2 diabetes mellitus without complications: Secondary | ICD-10-CM | POA: Diagnosis not present

## 2020-06-02 DIAGNOSIS — M1712 Unilateral primary osteoarthritis, left knee: Secondary | ICD-10-CM | POA: Diagnosis not present

## 2020-06-02 DIAGNOSIS — I10 Essential (primary) hypertension: Secondary | ICD-10-CM | POA: Diagnosis not present

## 2020-06-02 DIAGNOSIS — Z96652 Presence of left artificial knee joint: Secondary | ICD-10-CM | POA: Diagnosis not present

## 2020-06-02 DIAGNOSIS — R600 Localized edema: Secondary | ICD-10-CM | POA: Diagnosis not present

## 2020-06-02 DIAGNOSIS — K5901 Slow transit constipation: Secondary | ICD-10-CM | POA: Diagnosis not present

## 2020-06-06 DIAGNOSIS — K5909 Other constipation: Secondary | ICD-10-CM | POA: Diagnosis not present

## 2020-06-06 DIAGNOSIS — I129 Hypertensive chronic kidney disease with stage 1 through stage 4 chronic kidney disease, or unspecified chronic kidney disease: Secondary | ICD-10-CM | POA: Diagnosis not present

## 2020-06-06 DIAGNOSIS — N189 Chronic kidney disease, unspecified: Secondary | ICD-10-CM | POA: Diagnosis not present

## 2020-06-06 DIAGNOSIS — Z471 Aftercare following joint replacement surgery: Secondary | ICD-10-CM | POA: Diagnosis not present

## 2020-06-06 DIAGNOSIS — F419 Anxiety disorder, unspecified: Secondary | ICD-10-CM | POA: Diagnosis not present

## 2020-06-06 DIAGNOSIS — G8929 Other chronic pain: Secondary | ICD-10-CM | POA: Diagnosis not present

## 2020-06-06 DIAGNOSIS — K5901 Slow transit constipation: Secondary | ICD-10-CM | POA: Diagnosis not present

## 2020-06-06 DIAGNOSIS — F32A Depression, unspecified: Secondary | ICD-10-CM | POA: Diagnosis not present

## 2020-06-06 DIAGNOSIS — E1122 Type 2 diabetes mellitus with diabetic chronic kidney disease: Secondary | ICD-10-CM | POA: Diagnosis not present

## 2020-06-07 ENCOUNTER — Other Ambulatory Visit: Payer: Self-pay

## 2020-06-07 NOTE — Patient Outreach (Signed)
[facility]) Care Management  06/07/2020  Bianca Mckee May 16, 1950 [id number]   Referral Date: 06/07/20 Referral Source: [employer] Report  Date of Discharge:06/05/20 Facility: [place]: [facility]   Outreach attempt: No answer.  HIPAA compliant voice message left.   Plan: RN CM will attempt again within 4 business days and send letter.   Jone Baseman, RN, MSN [facility] Care Management Care Management Coordinator Direct Line [phone] Toll Free: [phone]  Fax: [phone]

## 2020-06-07 NOTE — Patient Outreach (Signed)
[place] [facility]) Care Management  06/07/2020  Bianca Mckee 04-Jul-1950 [id number]   Referral Date: 06/07/20 Referral Source: [employer] Report  Date of Discharge:06/05/20 Facility: [place]: [facility]   Outreach attempt: return call to patient. She reports she is getting along.  She lives alone but has assistance in the home. Patient has follow up with physician on Friday.  Denies problems with transportation.    Conditions: Patient with recent left knee replacement.  Patient up with a walker.   Patient admits to diabetes and HTN but states she has had those for years and does well with control.  She states her A1c is 6.2.  Discussed ongoing education and support.  Patient declined.    Medications:Patient reports no problems with medications and able to review medications.     Consent: RN CM reviewed [facility] services with patient and offered ongoing nurse follow up. .  Patient decllined wanting ongoing support at this time.   Plan: RN CM will close case.     Jone Baseman, RN, MSN [facility] Care Management Care Management Coordinator Direct Line [phone] Toll Free: [phone]  Fax: [phone]

## 2020-06-08 DIAGNOSIS — Z471 Aftercare following joint replacement surgery: Secondary | ICD-10-CM | POA: Diagnosis not present

## 2020-06-08 DIAGNOSIS — F32A Depression, unspecified: Secondary | ICD-10-CM | POA: Diagnosis not present

## 2020-06-08 DIAGNOSIS — N189 Chronic kidney disease, unspecified: Secondary | ICD-10-CM | POA: Diagnosis not present

## 2020-06-08 DIAGNOSIS — K5901 Slow transit constipation: Secondary | ICD-10-CM | POA: Diagnosis not present

## 2020-06-08 DIAGNOSIS — E1122 Type 2 diabetes mellitus with diabetic chronic kidney disease: Secondary | ICD-10-CM | POA: Diagnosis not present

## 2020-06-08 DIAGNOSIS — I129 Hypertensive chronic kidney disease with stage 1 through stage 4 chronic kidney disease, or unspecified chronic kidney disease: Secondary | ICD-10-CM | POA: Diagnosis not present

## 2020-06-08 DIAGNOSIS — K5909 Other constipation: Secondary | ICD-10-CM | POA: Diagnosis not present

## 2020-06-08 DIAGNOSIS — F419 Anxiety disorder, unspecified: Secondary | ICD-10-CM | POA: Diagnosis not present

## 2020-06-08 DIAGNOSIS — G8929 Other chronic pain: Secondary | ICD-10-CM | POA: Diagnosis not present

## 2020-06-10 DIAGNOSIS — I129 Hypertensive chronic kidney disease with stage 1 through stage 4 chronic kidney disease, or unspecified chronic kidney disease: Secondary | ICD-10-CM | POA: Diagnosis not present

## 2020-06-10 DIAGNOSIS — F419 Anxiety disorder, unspecified: Secondary | ICD-10-CM | POA: Diagnosis not present

## 2020-06-10 DIAGNOSIS — F32A Depression, unspecified: Secondary | ICD-10-CM | POA: Diagnosis not present

## 2020-06-10 DIAGNOSIS — K5909 Other constipation: Secondary | ICD-10-CM | POA: Diagnosis not present

## 2020-06-10 DIAGNOSIS — E1122 Type 2 diabetes mellitus with diabetic chronic kidney disease: Secondary | ICD-10-CM | POA: Diagnosis not present

## 2020-06-10 DIAGNOSIS — K5901 Slow transit constipation: Secondary | ICD-10-CM | POA: Diagnosis not present

## 2020-06-10 DIAGNOSIS — G8929 Other chronic pain: Secondary | ICD-10-CM | POA: Diagnosis not present

## 2020-06-10 DIAGNOSIS — N189 Chronic kidney disease, unspecified: Secondary | ICD-10-CM | POA: Diagnosis not present

## 2020-06-10 DIAGNOSIS — Z471 Aftercare following joint replacement surgery: Secondary | ICD-10-CM | POA: Diagnosis not present

## 2020-06-13 DIAGNOSIS — K5909 Other constipation: Secondary | ICD-10-CM | POA: Diagnosis not present

## 2020-06-13 DIAGNOSIS — I129 Hypertensive chronic kidney disease with stage 1 through stage 4 chronic kidney disease, or unspecified chronic kidney disease: Secondary | ICD-10-CM | POA: Diagnosis not present

## 2020-06-13 DIAGNOSIS — F419 Anxiety disorder, unspecified: Secondary | ICD-10-CM | POA: Diagnosis not present

## 2020-06-13 DIAGNOSIS — G8929 Other chronic pain: Secondary | ICD-10-CM | POA: Diagnosis not present

## 2020-06-13 DIAGNOSIS — N189 Chronic kidney disease, unspecified: Secondary | ICD-10-CM | POA: Diagnosis not present

## 2020-06-13 DIAGNOSIS — F32A Depression, unspecified: Secondary | ICD-10-CM | POA: Diagnosis not present

## 2020-06-13 DIAGNOSIS — K5901 Slow transit constipation: Secondary | ICD-10-CM | POA: Diagnosis not present

## 2020-06-13 DIAGNOSIS — E1122 Type 2 diabetes mellitus with diabetic chronic kidney disease: Secondary | ICD-10-CM | POA: Diagnosis not present

## 2020-06-13 DIAGNOSIS — Z471 Aftercare following joint replacement surgery: Secondary | ICD-10-CM | POA: Diagnosis not present

## 2020-06-15 ENCOUNTER — Encounter: Payer: Self-pay | Admitting: Family Medicine

## 2020-06-15 DIAGNOSIS — N189 Chronic kidney disease, unspecified: Secondary | ICD-10-CM | POA: Diagnosis not present

## 2020-06-15 DIAGNOSIS — E1122 Type 2 diabetes mellitus with diabetic chronic kidney disease: Secondary | ICD-10-CM | POA: Diagnosis not present

## 2020-06-15 DIAGNOSIS — Z471 Aftercare following joint replacement surgery: Secondary | ICD-10-CM | POA: Diagnosis not present

## 2020-06-15 DIAGNOSIS — F419 Anxiety disorder, unspecified: Secondary | ICD-10-CM | POA: Diagnosis not present

## 2020-06-15 DIAGNOSIS — K5901 Slow transit constipation: Secondary | ICD-10-CM | POA: Diagnosis not present

## 2020-06-15 DIAGNOSIS — G8929 Other chronic pain: Secondary | ICD-10-CM | POA: Diagnosis not present

## 2020-06-15 DIAGNOSIS — I129 Hypertensive chronic kidney disease with stage 1 through stage 4 chronic kidney disease, or unspecified chronic kidney disease: Secondary | ICD-10-CM | POA: Diagnosis not present

## 2020-06-15 DIAGNOSIS — K5909 Other constipation: Secondary | ICD-10-CM | POA: Diagnosis not present

## 2020-06-15 DIAGNOSIS — F32A Depression, unspecified: Secondary | ICD-10-CM | POA: Diagnosis not present

## 2020-06-20 DIAGNOSIS — M6281 Muscle weakness (generalized): Secondary | ICD-10-CM | POA: Diagnosis not present

## 2020-06-20 DIAGNOSIS — M25562 Pain in left knee: Secondary | ICD-10-CM | POA: Diagnosis not present

## 2020-06-20 DIAGNOSIS — M25661 Stiffness of right knee, not elsewhere classified: Secondary | ICD-10-CM | POA: Diagnosis not present

## 2020-06-20 DIAGNOSIS — R262 Difficulty in walking, not elsewhere classified: Secondary | ICD-10-CM | POA: Diagnosis not present

## 2020-06-22 DIAGNOSIS — M25661 Stiffness of right knee, not elsewhere classified: Secondary | ICD-10-CM | POA: Diagnosis not present

## 2020-06-22 DIAGNOSIS — R262 Difficulty in walking, not elsewhere classified: Secondary | ICD-10-CM | POA: Diagnosis not present

## 2020-06-22 DIAGNOSIS — M25562 Pain in left knee: Secondary | ICD-10-CM | POA: Diagnosis not present

## 2020-06-22 DIAGNOSIS — M6281 Muscle weakness (generalized): Secondary | ICD-10-CM | POA: Diagnosis not present

## 2020-06-24 DIAGNOSIS — M25562 Pain in left knee: Secondary | ICD-10-CM | POA: Diagnosis not present

## 2020-06-24 DIAGNOSIS — R262 Difficulty in walking, not elsewhere classified: Secondary | ICD-10-CM | POA: Diagnosis not present

## 2020-06-24 DIAGNOSIS — M25661 Stiffness of right knee, not elsewhere classified: Secondary | ICD-10-CM | POA: Diagnosis not present

## 2020-06-24 DIAGNOSIS — M6281 Muscle weakness (generalized): Secondary | ICD-10-CM | POA: Diagnosis not present

## 2020-06-27 DIAGNOSIS — M25562 Pain in left knee: Secondary | ICD-10-CM | POA: Diagnosis not present

## 2020-06-27 DIAGNOSIS — R262 Difficulty in walking, not elsewhere classified: Secondary | ICD-10-CM | POA: Diagnosis not present

## 2020-06-27 DIAGNOSIS — M25661 Stiffness of right knee, not elsewhere classified: Secondary | ICD-10-CM | POA: Diagnosis not present

## 2020-06-27 DIAGNOSIS — M6281 Muscle weakness (generalized): Secondary | ICD-10-CM | POA: Diagnosis not present

## 2020-06-29 ENCOUNTER — Encounter: Payer: Self-pay | Admitting: Family Medicine

## 2020-06-29 DIAGNOSIS — G4700 Insomnia, unspecified: Secondary | ICD-10-CM

## 2020-06-29 DIAGNOSIS — M6281 Muscle weakness (generalized): Secondary | ICD-10-CM | POA: Diagnosis not present

## 2020-06-29 DIAGNOSIS — R262 Difficulty in walking, not elsewhere classified: Secondary | ICD-10-CM | POA: Diagnosis not present

## 2020-06-29 DIAGNOSIS — M25562 Pain in left knee: Secondary | ICD-10-CM | POA: Diagnosis not present

## 2020-06-29 DIAGNOSIS — M25661 Stiffness of right knee, not elsewhere classified: Secondary | ICD-10-CM | POA: Diagnosis not present

## 2020-06-29 MED ORDER — ALPRAZOLAM 1 MG PO TABS
0.5000 mg | ORAL_TABLET | Freq: Every day | ORAL | 1 refills | Status: DC | PRN
Start: 2020-06-29 — End: 2020-09-02

## 2020-07-01 DIAGNOSIS — R262 Difficulty in walking, not elsewhere classified: Secondary | ICD-10-CM | POA: Diagnosis not present

## 2020-07-01 DIAGNOSIS — M6281 Muscle weakness (generalized): Secondary | ICD-10-CM | POA: Diagnosis not present

## 2020-07-01 DIAGNOSIS — M25661 Stiffness of right knee, not elsewhere classified: Secondary | ICD-10-CM | POA: Diagnosis not present

## 2020-07-01 DIAGNOSIS — M25562 Pain in left knee: Secondary | ICD-10-CM | POA: Diagnosis not present

## 2020-07-01 IMAGING — MG MM DIGITAL DIAGNOSTIC UNILAT*L* W/ TOMO W/ CAD
4 series · 4 of 12 positions shown · non-contrast
Comparison: Previous exam(s).

CLINICAL DATA: Recall from screening mammography with
tomosynthesis, possible focal asymmetry involving the LOWER OUTER
subareolar LEFT breast.

EXAM:
DIGITAL DIAGNOSTIC UNILATERAL LEFT MAMMOGRAM WITH TOMO

[L CC synth-2D]
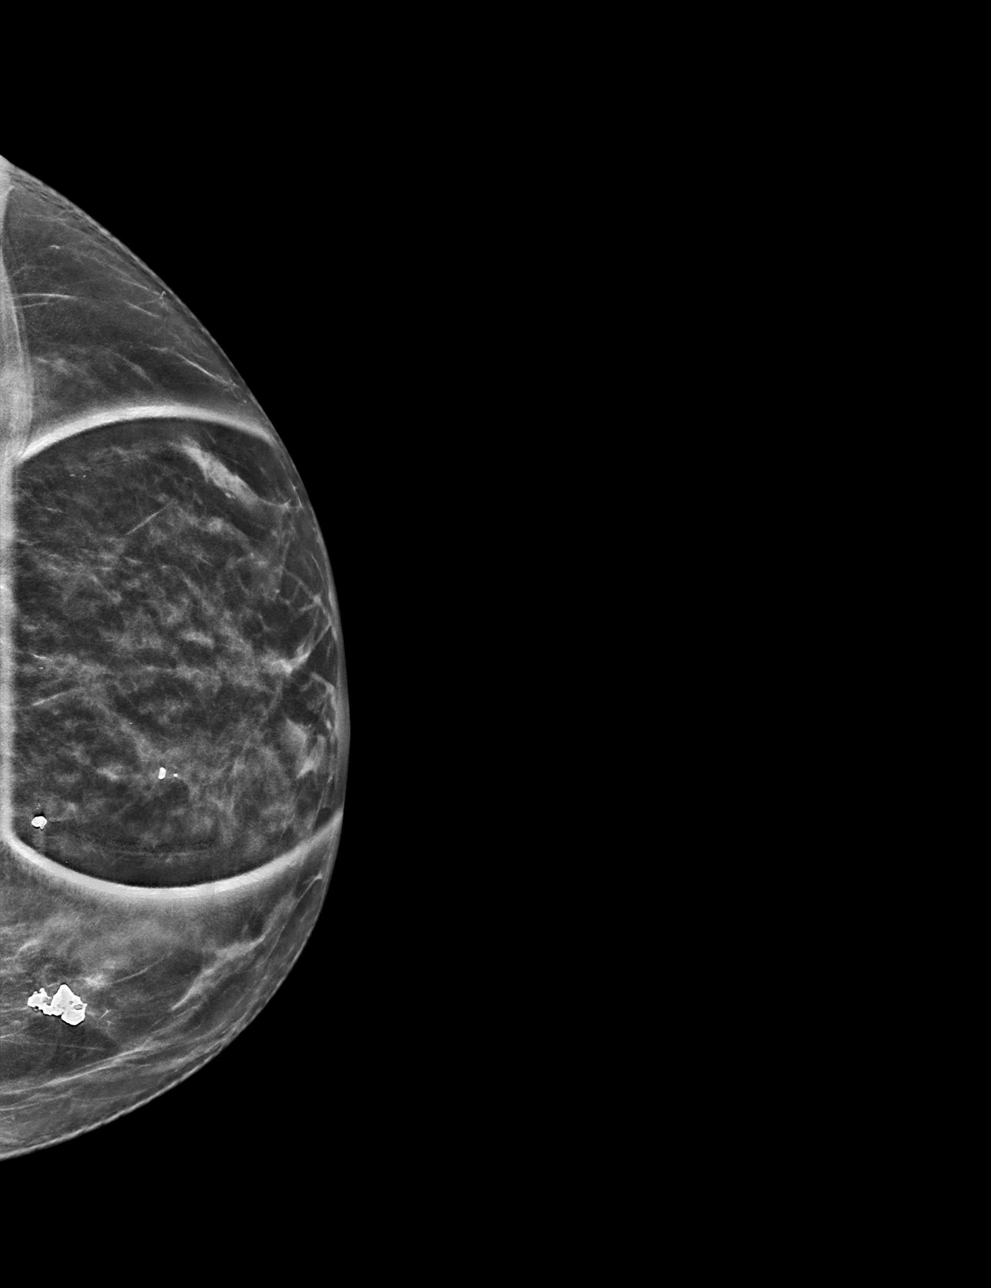

[L MLO synth-2D]
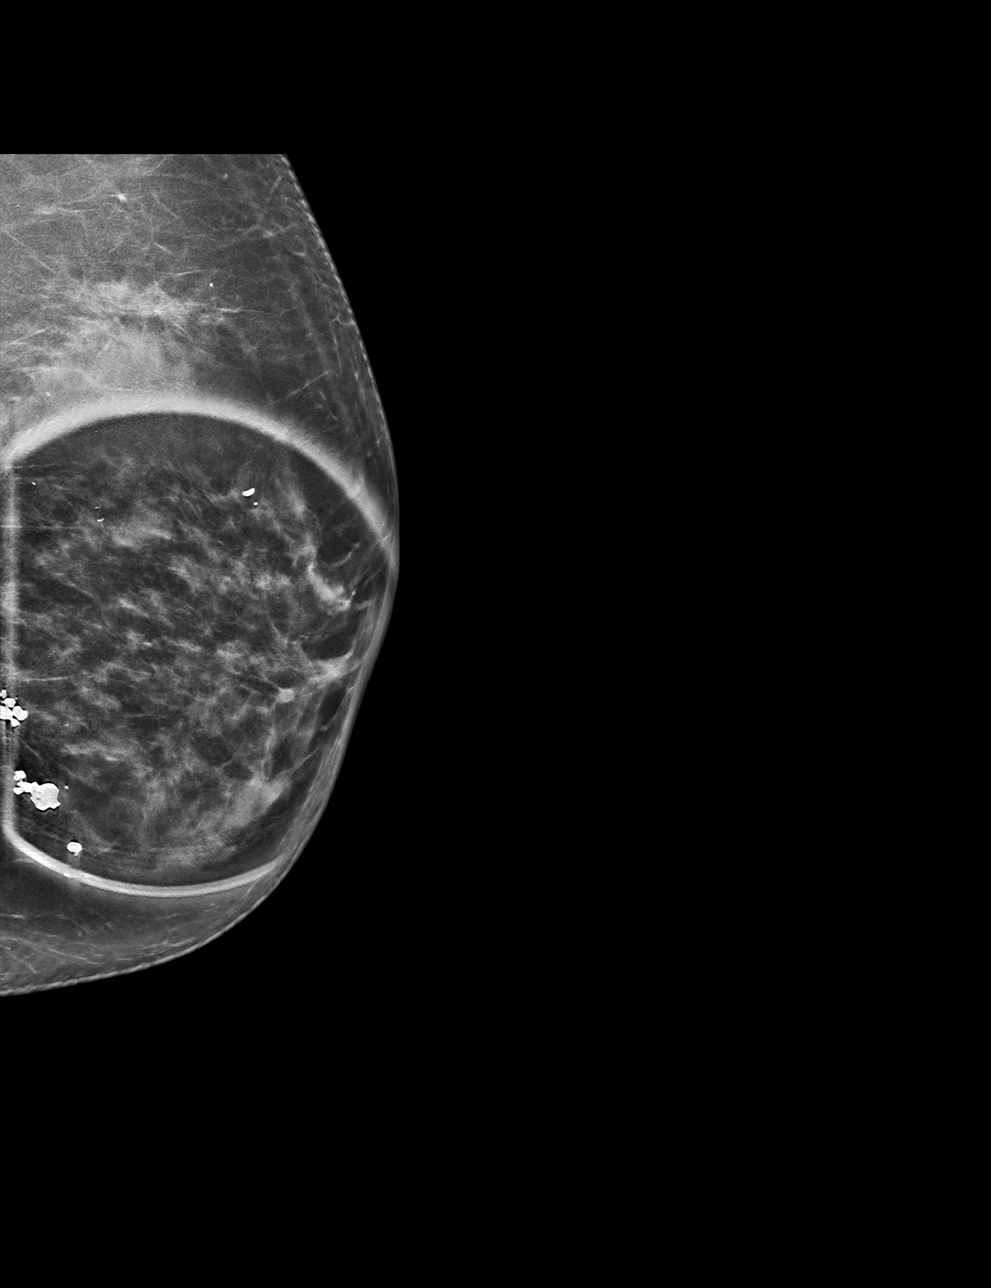

[L MLO tomo · tomo slice 29/58.0]
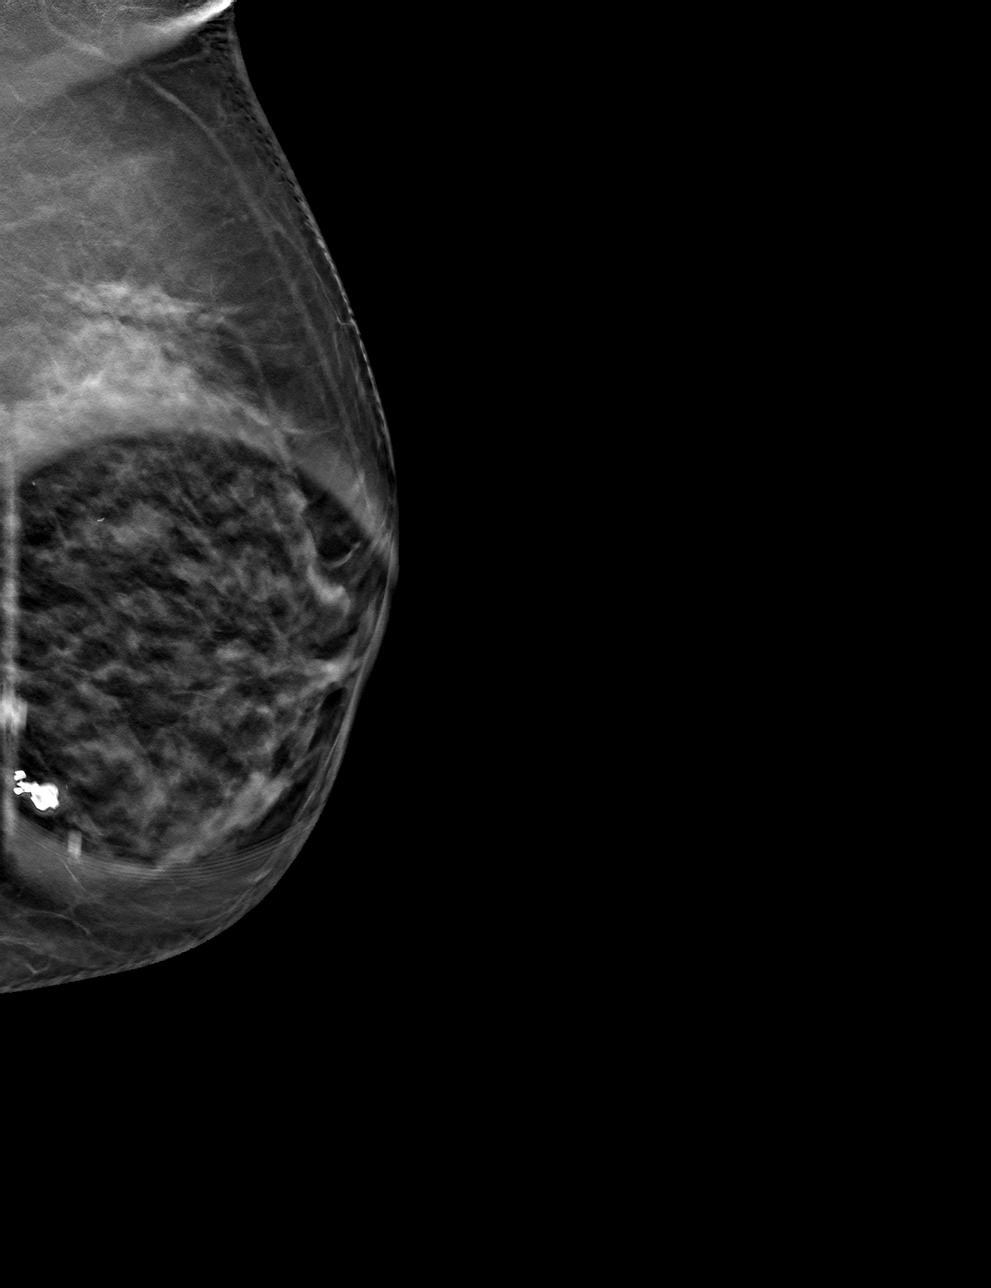

[L CC tomo · tomo slice 27/53.0]
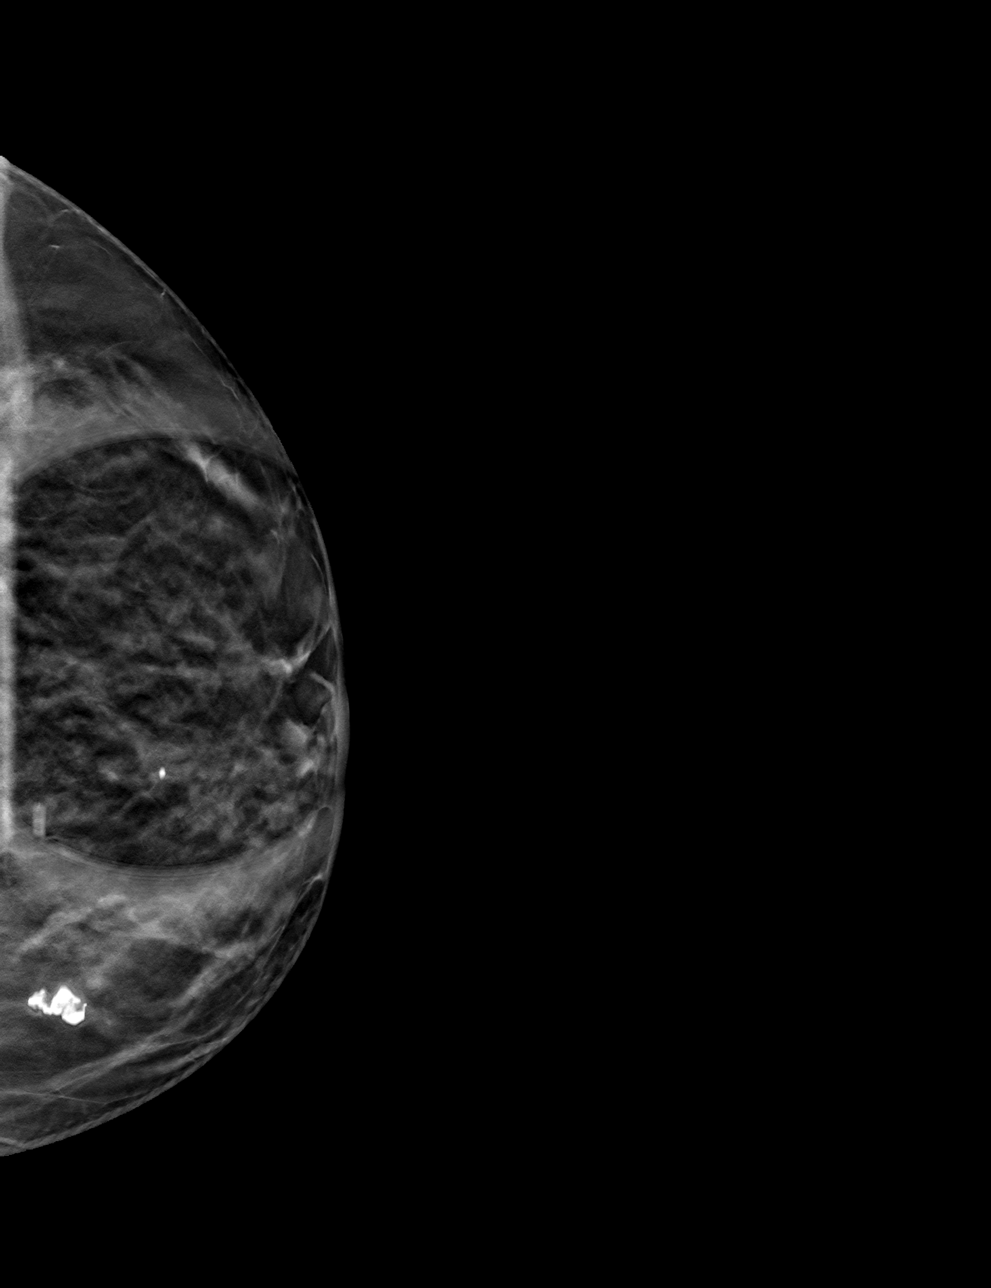

[4 of 12 positions shown; findings below may reference images not displayed]

ACR Breast Density Category c: The breast tissue is heterogeneously
dense, which may obscure small masses.
FINDINGS: Tomosynthesis and synthesized spot-compression CC and MLO views of
the area of concern in the LEFT breast were obtained.

The focal asymmetry in the LOWER OUTER subareolar location
questioned on screening mammography completely disperses with
compression and there is no underlying mass or architectural
distortion.
IMPRESSION: No mammographic evidence of malignancy involving the LEFT breast.

RECOMMENDATION:
Screening mammogram in one year.(Code:[id number])

I have discussed the findings and recommendations with the patient.
If applicable, a reminder letter will be sent to the patient
regarding the next appointment.

BI-RADS CATEGORY  1: Negative.

## 2020-07-04 DIAGNOSIS — M25562 Pain in left knee: Secondary | ICD-10-CM | POA: Diagnosis not present

## 2020-07-04 DIAGNOSIS — M6281 Muscle weakness (generalized): Secondary | ICD-10-CM | POA: Diagnosis not present

## 2020-07-04 DIAGNOSIS — R262 Difficulty in walking, not elsewhere classified: Secondary | ICD-10-CM | POA: Diagnosis not present

## 2020-07-04 DIAGNOSIS — M25661 Stiffness of right knee, not elsewhere classified: Secondary | ICD-10-CM | POA: Diagnosis not present

## 2020-07-06 ENCOUNTER — Encounter: Payer: Self-pay | Admitting: Family Medicine

## 2020-07-06 DIAGNOSIS — M25562 Pain in left knee: Secondary | ICD-10-CM | POA: Diagnosis not present

## 2020-07-06 DIAGNOSIS — R262 Difficulty in walking, not elsewhere classified: Secondary | ICD-10-CM | POA: Diagnosis not present

## 2020-07-06 DIAGNOSIS — M25661 Stiffness of right knee, not elsewhere classified: Secondary | ICD-10-CM | POA: Diagnosis not present

## 2020-07-06 DIAGNOSIS — M6281 Muscle weakness (generalized): Secondary | ICD-10-CM | POA: Diagnosis not present

## 2020-07-08 ENCOUNTER — Ambulatory Visit (INDEPENDENT_AMBULATORY_CARE_PROVIDER_SITE_OTHER): Payer: Medicare HMO | Admitting: Family Medicine

## 2020-07-08 DIAGNOSIS — M25562 Pain in left knee: Secondary | ICD-10-CM | POA: Diagnosis not present

## 2020-07-08 DIAGNOSIS — R262 Difficulty in walking, not elsewhere classified: Secondary | ICD-10-CM | POA: Diagnosis not present

## 2020-07-08 DIAGNOSIS — Z0000 Encounter for general adult medical examination without abnormal findings: Secondary | ICD-10-CM | POA: Diagnosis not present

## 2020-07-08 DIAGNOSIS — M25661 Stiffness of right knee, not elsewhere classified: Secondary | ICD-10-CM | POA: Diagnosis not present

## 2020-07-08 DIAGNOSIS — M6281 Muscle weakness (generalized): Secondary | ICD-10-CM | POA: Diagnosis not present

## 2020-07-08 NOTE — Patient Instructions (Addendum)
[place] Maintenance Summary and Written Plan of Care  Ms. Bianca Mckee ,  Thank you for allowing me to perform your Medicare Annual Wellness Visit and for your ongoing commitment to your health.   Health Maintenance & Immunization History Health Maintenance  Topic Date Due  . FOOT EXAM  07/17/2020 (Originally 12/11/2019)  . HEMOGLOBIN A1C  10/10/2020  . INFLUENZA VACCINE  10/17/2020  . COLONOSCOPY (Pts 45-45yrs Insurance coverage will need to be confirmed)  12/19/2020  . DEXA SCAN  12/23/2020  . OPHTHALMOLOGY EXAM  05/09/2021  . MAMMOGRAM  04/20/2022  . TETANUS/TDAP  06/13/2025  . COVID-19 Vaccine  Completed  . Hepatitis C Screening  Completed  . PNA vac Low Risk Adult  Completed  . HPV VACCINES  Aged Out   Immunization History  Administered Date(s) Administered  . Fluad Quad(high Dose 65+) 02/27/2019  . Influenza-Unspecified 02/27/2019  . PFIZER(Purple Top)SARS-COV-2 Vaccination 05/28/2019, 06/18/2019, 12/30/2019  . Pneumococcal Conjugate-13 03/09/2014  . Pneumococcal Polysaccharide-23 01/06/2015, 04/12/2020  . Tdap 06/14/2015  . Zoster 01/06/2015  . Zoster Recombinat (Shingrix) 12/11/2018    These are the patient goals that we discussed: Goals Addressed              This Visit's Progress   .  Patient Stated (pt-stated)        07/08/2020 AWV Goal: Improved Nutrition/Diet  . Patient will verbalize understanding that diet plays an important role in overall health and that a poor diet is a risk factor for many chronic medical conditions.  . Over the next year, patient will improve self management of their diet by incorporating more water. . Patient will utilize available community resources to help with food acquisition if needed (ex: food pantries, [address], etc) . Patient will work with nutrition specialist if a referral was made         This is a list of Health Maintenance Items that are overdue or due now: Shingrix (one dose  completed)  Orders/Referrals Placed Today: No orders of the defined types were placed in this encounter.  (Contact our referral department at [phone] if you have not spoken with someone about your referral appointment within the next 5 days)    Follow-up Plan . Follow-up with Hali Marry, MD as planned . Schedule your appointment for your second shingrix vaccine at your pharmacy.  . Foot exam can be done at your next in-office appointment.  . Medicare wellness visit in one year.       Health Maintenance, Female Adopting a healthy lifestyle and getting preventive care are important in promoting health and wellness. Ask your health care provider about:  The right schedule for you to have regular tests and exams.  Things you can do on your own to prevent diseases and keep yourself healthy. What should I know about diet, weight, and exercise? Eat a healthy diet  Eat a diet that includes plenty of vegetables, fruits, low-fat dairy products, and lean protein.  Do not eat a lot of foods that are high in solid fats, added sugars, or sodium.   Maintain a healthy weight Body mass index (BMI) is used to identify weight problems. It estimates body fat based on height and weight. Your health care provider can help determine your BMI and help you achieve or maintain a healthy weight. Get regular exercise Get regular exercise. This is one of the most important things you can do for your health. Most adults should:  Exercise for at least  150 minutes each week. The exercise should increase your heart rate and make you sweat (moderate-intensity exercise).  Do strengthening exercises at least twice a week. This is in addition to the moderate-intensity exercise.  Spend less time sitting. Even light physical activity can be beneficial. Watch cholesterol and blood lipids Have your blood tested for lipids and cholesterol at 68 years of age, then have this test every 5 years. Have  your cholesterol levels checked more often if:  Your lipid or cholesterol levels are high.  You are older than 68 years of age.  You are at high risk for heart disease. What should I know about cancer screening? Depending on your health history and family history, you may need to have cancer screening at various ages. This may include screening for:  Breast cancer.  Cervical cancer.  Colorectal cancer.  Skin cancer.  Lung cancer. What should I know about heart disease, diabetes, and high blood pressure? Blood pressure and heart disease  High blood pressure causes heart disease and increases the risk of stroke. This is more likely to develop in people who have high blood pressure readings, are of African descent, or are overweight.  Have your blood pressure checked: ? Every 3-5 years if you are 48-11 years of age. ? Every year if you are 10 years old or older. Diabetes Have regular diabetes screenings. This checks your fasting blood sugar level. Have the screening done:  Once every three years after age 75 if you are at a normal weight and have a low risk for diabetes.  More often and at a younger age if you are overweight or have a high risk for diabetes. What should I know about preventing infection? Hepatitis B If you have a higher risk for hepatitis B, you should be screened for this virus. Talk with your health care provider to find out if you are at risk for hepatitis B infection. Hepatitis C Testing is recommended for:  Everyone born from [birth year] through 1963.  Anyone with known risk factors for hepatitis C. Sexually transmitted infections (STIs)  Get screened for STIs, including gonorrhea and chlamydia, if: ? You are sexually active and are younger than 68 years of age. ? You are older than 68 years of age and your health care provider tells you that you are at risk for this type of infection. ? Your sexual activity has changed since you were last screened, and you  are at increased risk for chlamydia or gonorrhea. Ask your health care provider if you are at risk.  Ask your health care provider about whether you are at high risk for HIV. Your health care provider may recommend a prescription medicine to help prevent HIV infection. If you choose to take medicine to prevent HIV, you should first get tested for HIV. You should then be tested every 3 months for as long as you are taking the medicine. Pregnancy  If you are about to stop having your period (premenopausal) and you may become pregnant, seek counseling before you get pregnant.  Take 400 to 800 micrograms (mcg) of folic acid every day if you become pregnant.  Ask for birth control (contraception) if you want to prevent pregnancy. Osteoporosis and menopause Osteoporosis is a disease in which the bones lose minerals and strength with aging. This can result in bone fractures. If you are 47 years old or older, or if you are at risk for osteoporosis and fractures, ask your health care provider if you should:  Be screened for bone loss.  Take a calcium or vitamin D supplement to lower your risk of fractures.  Be given hormone replacement therapy (HRT) to treat symptoms of menopause. Follow these instructions at home: Lifestyle  Do not use any products that contain nicotine or tobacco, such as cigarettes, e-cigarettes, and chewing tobacco. If you need help quitting, ask your health care provider.  Do not use street drugs.  Do not share needles.  Ask your health care provider for help if you need support or information about quitting drugs. Alcohol use  Do not drink alcohol if: ? Your health care provider tells you not to drink. ? You are pregnant, may be pregnant, or are planning to become pregnant.  If you drink alcohol: ? Limit how much you use to 0-1 drink a day. ? Limit intake if you are breastfeeding.  Be aware of how much alcohol is in your drink. In the [place] one drink equals one 12  oz bottle of beer (355 mL), one 5 oz glass of wine (148 mL), or one 1 oz glass of hard liquor (44 mL). General instructions  Schedule regular health, dental, and eye exams.  Stay current with your vaccines.  Tell your health care provider if: ? You often feel depressed. ? You have ever been abused or do not feel safe at home. Summary  Adopting a healthy lifestyle and getting preventive care are important in promoting health and wellness.  Follow your health care provider's instructions about healthy diet, exercising, and getting tested or screened for diseases.  Follow your health care provider's instructions on monitoring your cholesterol and blood pressure. This information is not intended to replace advice given to you by your health care provider. Make sure you discuss any questions you have with your health care provider. Document Revised: 02/26/2018 Document Reviewed: 02/26/2018 Elsevier Patient Education  2021 [employer].

## 2020-07-08 NOTE — Progress Notes (Addendum)
MEDICARE ANNUAL WELLNESS VISIT  07/08/2020  Telephone Visit Disclaimer This Medicare AWV was conducted by telephone due to national recommendations for restrictions regarding the COVID-19 Pandemic (e.g. social distancing).  I verified, using two identifiers, that I am speaking with Bianca Mckee or their authorized healthcare agent. I discussed the limitations, risks, security, and privacy concerns of performing an evaluation and management service by telephone and the potential availability of an in-person appointment in the future. The patient expressed understanding and agreed to proceed.  Location of Patient: Home Location of Provider (nurse):  In the office.  Subjective:    Bianca Mckee is a 70 y.o. female patient of Metheney, Rene Kocher, MD who had a Medicare Annual Wellness Visit today via telephone. Tonyia is Retired and lives alone. she has 1 child. she reports that she is socially active and does interact with friends/family regularly. she is minimally physically active due to recent surgery and enjoys watching tv.  Patient Care Team: Hali Marry, MD as PCP - General Clent Jacks, MD as Referring Physician (Nephrology) Dr. Louretta Shorten  (Gynecology)  Advanced Directives 07/08/2020 06/07/2020 12/02/2018  Does Patient Have a Medical Advance Directive? No No No  Would patient like information on creating a medical advance directive? No - Patient declined No - Patient declined No - Patient declined    Hospital Utilization Over the Past 12 Months: # of hospitalizations or ER visits: 0 # of surgeries: 2  Review of Systems    Patient reports that her overall health is better compared to last year.  History obtained from chart review and the patient  Patient Reported Readings (BP, Pulse, CBG, Weight, etc) none  Pain Assessment Pain : 0-10 Pain Score: 4  Pain Type: Other (Comment) (surgical pain) Pain Location: Knee Pain Orientation: Left Pain Descriptors  / Indicators: Constant Pain Onset: More than a month ago Pain Frequency: Intermittent Pain Relieving Factors: Pain medication and Ice  Pain Relieving Factors: Pain medication and Ice  Current Medications & Allergies (verified) Allergies as of 07/08/2020      Reactions   Ambien [zolpidem Tartrate] Other (See Comments)   Sleep walking, injury   Codeine Phosphate    Eszopiclone Other (See Comments)   Metallic taste      Medication List       Accurate as of July 08, 2020  2:29 PM. If you have any questions, ask your nurse or doctor.        Accu-Chek Guide test strip Generic drug: glucose blood   Accu-Chek Softclix Lancets lancets   ALPRAZolam 1 MG tablet Commonly known as: XANAX Take 0.5-1 tablets (0.5-1 mg total) by mouth daily as needed for anxiety.   aspirin 325 MG EC tablet Take by mouth.   atorvastatin 40 MG tablet Commonly known as: LIPITOR TAKE 1 TABLET BY MOUTH EVERYDAY AT BEDTIME   blood glucose meter kit and supplies Dispense based on patient and insurance preference. Use to test blood sugar once daily. Dx: E11.9   buPROPion 150 MG 24 hr tablet Commonly known as: WELLBUTRIN XL TAKE 1 TABLET BY MOUTH EVERY DAY   diclofenac Sodium 1 % Gel Commonly known as: VOLTAREN APPLY 4 G TOPICALLY 4 (FOUR) TIMES DAILY.   doxepin 10 MG capsule Commonly known as: SINEQUAN TAKE 1 CAPSULE BY MOUTH AT BEDTIME   hydrochlorothiazide 25 MG tablet Commonly known as: HYDRODIURIL TAKE 1 TABLET BY MOUTH EVERY DAY   HYDROcodone-acetaminophen 5-325 MG tablet Commonly known as: NORCO/VICODIN Take 1 tablet  by mouth every 6 (six) hours as needed.   linaclotide 145 MCG Caps capsule Commonly known as: Linzess Take 1 capsule (145 mcg total) by mouth daily.   Melatonin 10 MG Tabs Take 10 mg by mouth at bedtime as needed.   metFORMIN 1000 MG tablet Commonly known as: GLUCOPHAGE TAKE 1 TABLET (1,000 MG TOTAL) BY MOUTH 2 (TWO) TIMES DAILY WITH A MEAL.   omeprazole 40 MG  capsule Commonly known as: PRILOSEC TAKE 1 CAPSULE BY MOUTH EVERY DAY   oxyCODONE 5 MG immediate release tablet Commonly known as: Oxy IR/ROXICODONE Take by mouth.   sertraline 100 MG tablet Commonly known as: ZOLOFT TAKE 1.5 TABLETS BY MOUTH DAILY   telmisartan 20 MG tablet Commonly known as: MICARDIS TAKE 1 TABLET BY MOUTH EVERY DAY       History (reviewed): Past Medical History:  Diagnosis Date  . Anxiety   . Diabetes mellitus without complication ([place])   . GERD (gastroesophageal reflux disease)   . Hypertension 1996  . Vaginal Pap smear, abnormal    Past Surgical History:  Procedure Laterality Date  . ARTHROPLASTY Left    left knee  . CERVIX LESION DESTRUCTION    . TUBAL LIGATION     Family History  Problem Relation Age of Onset  . Lung cancer Brother 63       smoker  . Pancreatic cancer Brother   . Pancreatic cancer Brother   . Lung cancer Brother    Social History   Socioeconomic History  . Marital status: Divorced    Spouse name: Not on file  . Number of children: 1  . Years of education: 38  . Highest education level: 12th grade  Occupational History  . Occupation: Full time     Comment: (currently out on disability due to surgery)  Tobacco Use  . Smoking status: Never Smoker  . Smokeless tobacco: Never Used  Vaping Use  . Vaping Use: Never used  Substance and Sexual Activity  . Alcohol use: Yes    Alcohol/week: 1.0 standard drink    Types: 1 Shots of liquor per week    Comment: occasionally  . Drug use: No  . Sexual activity: Yes    Birth control/protection: None  Other Topics Concern  . Not on file  Social History Narrative   Still working at [employer] in [place]. Lives alone. She recently had knee surgery. She enjoys watching t.v.    Social Determinants of Health   Financial Resource Strain: Low Risk   . Difficulty of Paying Living Expenses: Not hard at all  Food Insecurity: No Food Insecurity  . Worried About Ship broker in the Last Year: Never true  . Ran Out of Food in the Last Year: Never true  Transportation Needs: No Transportation Needs  . Lack of Transportation (Medical): No  . Lack of Transportation (Non-Medical): No  Physical Activity: Inactive  . Days of Exercise per Week: 0 days  . Minutes of Exercise per Session: 0 min  Stress: No Stress Concern Present  . Feeling of Stress : Not at all  Social Connections: Socially Isolated  . Frequency of Communication with Friends and Family: More than three times a week  . Frequency of Social Gatherings with Friends and Family: Three times a week  . Attends Religious Services: Never  . Active Member of Clubs or Organizations: No  . Attends Archivist Meetings: Never  . Marital Status: Divorced    Activities of  Daily Living In your present state of health, do you have any difficulty performing the following activities: 07/08/2020  Hearing? N  Vision? N  Difficulty concentrating or making decisions? N  Walking or climbing stairs? Y  Comment Due to recent surgery.  Dressing or bathing? Y  Comment Due to recent surgery.  Doing errands, shopping? Y  Comment due to recent surgery  Preparing Food and eating ? N  Using the Toilet? N  In the past six months, have you accidently leaked urine? N  Do you have problems with loss of bowel control? N  Managing your Medications? N  Managing your Finances? N  Housekeeping or managing your Housekeeping? N  Some recent data might be hidden    Patient Education/ Literacy How often do you need to have someone help you when you read instructions, pamphlets, or other written materials from your doctor or pharmacy?: 1 - Never What is the last grade level you completed in school?: 12th grade  Exercise Current Exercise Habits: Home exercise routine, Type of exercise: Other - see comments (Physical therapy), Time (Minutes): 60, Frequency (Times/Week): 3, Weekly Exercise (Minutes/Week): 180,  Intensity: Moderate, Exercise limited by: orthopedic condition(s)  Diet Patient reports consuming 1 meals a day and 1 snack(s) a day Patient reports that her primary diet is: Regular Patient reports that she does have regular access to food.   Depression Screen PHQ 2/9 Scores 07/08/2020 04/12/2020 04/30/2019 12/02/2018 08/19/2018 08/05/2018 03/17/2018  PHQ - 2 Score _0 0 0 2  PHQ- 9 Score - 18 9 - - 4 8     Fall Risk Fall Risk  07/08/2020 04/12/2020 12/11/2018 12/02/2018 08/05/2018  Falls in the past year? 0 1 0 1 0  Number falls in past yr: 0 0 0 0 -  Injury with Fall? 0 0 0 0 -  Risk for fall due to : No Fall Risks - - Medication side effect -  Risk for fall due to: Comment - - - Due to the Ambien -  Follow up Falls evaluation completed - - Falls prevention discussed -     Objective:  Bianca Mckee seemed alert and oriented and she participated appropriately during our telephone visit.  Blood Pressure Weight BMI  BP Readings from Last 3 Encounters:  04/13/20 131/78  11/19/19 131/72  04/30/19 105/66   Wt Readings from Last 3 Encounters:  04/13/20 145 lb (65.8 kg)  11/19/19 146 lb (66.2 kg)  04/30/19 141 lb (64 kg)   BMI Readings from Last 1 Encounters:  04/13/20 26.52 kg/m    *Unable to obtain current vital signs, weight, and BMI due to telephone visit type  Hearing/Vision  . Fleurette did not seem to have difficulty with hearing/understanding during the telephone conversation . Reports that she has had a formal eye exam by an eye care professional within the past year . Reports that she has not had a formal hearing evaluation within the past year *Unable to fully assess hearing and vision during telephone visit type  Cognitive Function: 6CIT Screen 07/08/2020 12/02/2018  What Year? 0 points 0 points  What month? 0 points 0 points  What time? 0 points 0 points  Count back from 20 - 0 points  Months in reverse - 0 points  Repeat phrase - 0 points  Total Score - 0    (Normal:0-7, Significant for Dysfunction: >8)  Normal Cognitive Function Screening: Yes   Immunization & Health Maintenance Record Immunization History  Administered Date(s)  Administered  . Fluad Quad(high Dose 65+) 02/27/2019  . PFIZER(Purple Top)SARS-COV-2 Vaccination 05/28/2019, 06/18/2019, 12/30/2019  . Pneumococcal Conjugate-13 03/09/2014  . Pneumococcal Polysaccharide-23 01/06/2015, 04/12/2020  . Tdap 06/14/2015  . Zoster 01/06/2015  . Zoster Recombinat (Shingrix) 12/11/2018    Health Maintenance  Topic Date Due  . FOOT EXAM  12/11/2019  . HEMOGLOBIN A1C  10/10/2020  . INFLUENZA VACCINE  10/17/2020  . COLONOSCOPY (Pts 45-90yr Insurance coverage will need to be confirmed)  12/19/2020  . DEXA SCAN  12/23/2020  . OPHTHALMOLOGY EXAM  05/09/2021  . MAMMOGRAM  04/20/2022  . TETANUS/TDAP  06/13/2025  . COVID-19 Vaccine  Completed  . Hepatitis C Screening  Completed  . PNA vac Low Risk Adult  Completed  . HPV VACCINES  Aged Out       Assessment  This is a routine wellness examination for Bianca Mckee  Health Maintenance: Due or Overdue Health Maintenance Due  Topic Date Due  . FOOT EXAM  12/11/2019    Bianca Laundrydoes not need a referral for Community Assistance: Care Management:   no Social Work:    no Prescription Assistance:  no Nutrition/Diabetes Education:  no   Plan:  Personalized Goals Goals Addressed              This Visit's Progress   .  Patient Stated (pt-stated)        07/08/2020 AWV Goal: Improved Nutrition/Diet  . Patient will verbalize understanding that diet plays an important role in overall health and that a poor diet is a risk factor for many chronic medical conditions.  . Over the next year, patient will improve self management of their diet by incorporating more water. . Patient will utilize available community resources to help with food acquisition if needed (ex: food pantries, [address], etc) . Patient will work with  nutrition specialist if a referral was made       Personalized Health Maintenance & Screening Recommendations  Shingrix (one dose completed)  Lung Cancer Screening Recommended: no (Low Dose CT Chest recommended if Age 68-80years, 30 pack-year currently smoking OR have quit w/in past 15 years) Hepatitis C Screening recommended: no HIV Screening recommended: no  Advanced Directives: Written information was not prepared per patient's request.  Referrals & Orders No orders of the defined types were placed in this encounter.   Follow-up Plan . Follow-up with MHali Marry MD as planned . Schedule your appointment for your second shingrix vaccine at your pharmacy.  . Foot exam can be done at your next in-office appointment.  . Medicare wellness visit in one year.   I have personally reviewed and noted the following in the patient's chart:   . Medical and social history . Use of alcohol, tobacco or illicit drugs  . Current medications and supplements . Functional ability and status . Nutritional status . Physical activity . Advanced directives . List of other physicians . Hospitalizations, surgeries, and ER visits in previous 12 months . Vitals . Screenings to include cognitive, depression, and falls . Referrals and appointments  In addition, I have reviewed and discussed with Bianca Laundrycertain preventive protocols, quality metrics, and best practice recommendations. A written personalized care plan for preventive services as well as general preventive health recommendations is available and can be mailed to the patient at her request.      BTinnie Gens RN  07/08/2020

## 2020-07-11 DIAGNOSIS — M25562 Pain in left knee: Secondary | ICD-10-CM | POA: Diagnosis not present

## 2020-07-11 DIAGNOSIS — R262 Difficulty in walking, not elsewhere classified: Secondary | ICD-10-CM | POA: Diagnosis not present

## 2020-07-11 DIAGNOSIS — M25661 Stiffness of right knee, not elsewhere classified: Secondary | ICD-10-CM | POA: Diagnosis not present

## 2020-07-11 DIAGNOSIS — M6281 Muscle weakness (generalized): Secondary | ICD-10-CM | POA: Diagnosis not present

## 2020-07-12 DIAGNOSIS — M1712 Unilateral primary osteoarthritis, left knee: Secondary | ICD-10-CM | POA: Diagnosis not present

## 2020-07-13 DIAGNOSIS — R262 Difficulty in walking, not elsewhere classified: Secondary | ICD-10-CM | POA: Diagnosis not present

## 2020-07-13 DIAGNOSIS — M25661 Stiffness of right knee, not elsewhere classified: Secondary | ICD-10-CM | POA: Diagnosis not present

## 2020-07-13 DIAGNOSIS — M25562 Pain in left knee: Secondary | ICD-10-CM | POA: Diagnosis not present

## 2020-07-13 DIAGNOSIS — M6281 Muscle weakness (generalized): Secondary | ICD-10-CM | POA: Diagnosis not present

## 2020-07-15 DIAGNOSIS — M6281 Muscle weakness (generalized): Secondary | ICD-10-CM | POA: Diagnosis not present

## 2020-07-15 DIAGNOSIS — R262 Difficulty in walking, not elsewhere classified: Secondary | ICD-10-CM | POA: Diagnosis not present

## 2020-07-15 DIAGNOSIS — M25661 Stiffness of right knee, not elsewhere classified: Secondary | ICD-10-CM | POA: Diagnosis not present

## 2020-07-15 DIAGNOSIS — M25562 Pain in left knee: Secondary | ICD-10-CM | POA: Diagnosis not present

## 2020-07-18 DIAGNOSIS — R262 Difficulty in walking, not elsewhere classified: Secondary | ICD-10-CM | POA: Diagnosis not present

## 2020-07-18 DIAGNOSIS — M25661 Stiffness of right knee, not elsewhere classified: Secondary | ICD-10-CM | POA: Diagnosis not present

## 2020-07-18 DIAGNOSIS — M6281 Muscle weakness (generalized): Secondary | ICD-10-CM | POA: Diagnosis not present

## 2020-07-18 DIAGNOSIS — M25562 Pain in left knee: Secondary | ICD-10-CM | POA: Diagnosis not present

## 2020-07-20 DIAGNOSIS — M25562 Pain in left knee: Secondary | ICD-10-CM | POA: Diagnosis not present

## 2020-07-20 DIAGNOSIS — R262 Difficulty in walking, not elsewhere classified: Secondary | ICD-10-CM | POA: Diagnosis not present

## 2020-07-20 DIAGNOSIS — M6281 Muscle weakness (generalized): Secondary | ICD-10-CM | POA: Diagnosis not present

## 2020-07-20 DIAGNOSIS — M25661 Stiffness of right knee, not elsewhere classified: Secondary | ICD-10-CM | POA: Diagnosis not present

## 2020-07-22 DIAGNOSIS — R262 Difficulty in walking, not elsewhere classified: Secondary | ICD-10-CM | POA: Diagnosis not present

## 2020-07-22 DIAGNOSIS — M25562 Pain in left knee: Secondary | ICD-10-CM | POA: Diagnosis not present

## 2020-07-22 DIAGNOSIS — M25661 Stiffness of right knee, not elsewhere classified: Secondary | ICD-10-CM | POA: Diagnosis not present

## 2020-07-22 DIAGNOSIS — M6281 Muscle weakness (generalized): Secondary | ICD-10-CM | POA: Diagnosis not present

## 2020-07-25 DIAGNOSIS — R262 Difficulty in walking, not elsewhere classified: Secondary | ICD-10-CM | POA: Diagnosis not present

## 2020-07-25 DIAGNOSIS — M25562 Pain in left knee: Secondary | ICD-10-CM | POA: Diagnosis not present

## 2020-07-25 DIAGNOSIS — M25661 Stiffness of right knee, not elsewhere classified: Secondary | ICD-10-CM | POA: Diagnosis not present

## 2020-07-25 DIAGNOSIS — M6281 Muscle weakness (generalized): Secondary | ICD-10-CM | POA: Diagnosis not present

## 2020-07-27 DIAGNOSIS — R262 Difficulty in walking, not elsewhere classified: Secondary | ICD-10-CM | POA: Diagnosis not present

## 2020-07-27 DIAGNOSIS — M25562 Pain in left knee: Secondary | ICD-10-CM | POA: Diagnosis not present

## 2020-07-27 DIAGNOSIS — M25661 Stiffness of right knee, not elsewhere classified: Secondary | ICD-10-CM | POA: Diagnosis not present

## 2020-07-27 DIAGNOSIS — M6281 Muscle weakness (generalized): Secondary | ICD-10-CM | POA: Diagnosis not present

## 2020-07-29 ENCOUNTER — Other Ambulatory Visit: Payer: Self-pay | Admitting: Family Medicine

## 2020-07-29 DIAGNOSIS — R262 Difficulty in walking, not elsewhere classified: Secondary | ICD-10-CM | POA: Diagnosis not present

## 2020-07-29 DIAGNOSIS — M25562 Pain in left knee: Secondary | ICD-10-CM | POA: Diagnosis not present

## 2020-07-29 DIAGNOSIS — M6281 Muscle weakness (generalized): Secondary | ICD-10-CM | POA: Diagnosis not present

## 2020-07-29 DIAGNOSIS — M25661 Stiffness of right knee, not elsewhere classified: Secondary | ICD-10-CM | POA: Diagnosis not present

## 2020-08-01 DIAGNOSIS — R262 Difficulty in walking, not elsewhere classified: Secondary | ICD-10-CM | POA: Diagnosis not present

## 2020-08-01 DIAGNOSIS — M25562 Pain in left knee: Secondary | ICD-10-CM | POA: Diagnosis not present

## 2020-08-01 DIAGNOSIS — M6281 Muscle weakness (generalized): Secondary | ICD-10-CM | POA: Diagnosis not present

## 2020-08-01 DIAGNOSIS — M25661 Stiffness of right knee, not elsewhere classified: Secondary | ICD-10-CM | POA: Diagnosis not present

## 2020-08-03 ENCOUNTER — Telehealth: Payer: Self-pay | Admitting: Family Medicine

## 2020-08-03 DIAGNOSIS — M25562 Pain in left knee: Secondary | ICD-10-CM | POA: Diagnosis not present

## 2020-08-03 DIAGNOSIS — M6281 Muscle weakness (generalized): Secondary | ICD-10-CM | POA: Diagnosis not present

## 2020-08-03 DIAGNOSIS — M25661 Stiffness of right knee, not elsewhere classified: Secondary | ICD-10-CM | POA: Diagnosis not present

## 2020-08-03 DIAGNOSIS — R262 Difficulty in walking, not elsewhere classified: Secondary | ICD-10-CM | POA: Diagnosis not present

## 2020-08-03 NOTE — Progress Notes (Signed)
  Chronic Care Management   Outreach Note  08/03/2020 Name: Bianca Mckee MRN: [id number] DOB: 01/22/1951  Referred by: Hali Marry, MD Reason for referral : No chief complaint on file.   An unsuccessful telephone outreach was attempted today. The patient was referred to the pharmacist for assistance with care management and care coordination.   Follow Up Plan:   Lauretta Grill Upstream Scheduler

## 2020-08-04 ENCOUNTER — Other Ambulatory Visit: Payer: Self-pay | Admitting: Family Medicine

## 2020-08-04 DIAGNOSIS — F331 Major depressive disorder, recurrent, moderate: Secondary | ICD-10-CM

## 2020-08-05 DIAGNOSIS — M6281 Muscle weakness (generalized): Secondary | ICD-10-CM | POA: Diagnosis not present

## 2020-08-05 DIAGNOSIS — M25661 Stiffness of right knee, not elsewhere classified: Secondary | ICD-10-CM | POA: Diagnosis not present

## 2020-08-05 DIAGNOSIS — M25562 Pain in left knee: Secondary | ICD-10-CM | POA: Diagnosis not present

## 2020-08-05 DIAGNOSIS — R262 Difficulty in walking, not elsewhere classified: Secondary | ICD-10-CM | POA: Diagnosis not present

## 2020-08-08 DIAGNOSIS — M6281 Muscle weakness (generalized): Secondary | ICD-10-CM | POA: Diagnosis not present

## 2020-08-08 DIAGNOSIS — M25562 Pain in left knee: Secondary | ICD-10-CM | POA: Diagnosis not present

## 2020-08-08 DIAGNOSIS — R262 Difficulty in walking, not elsewhere classified: Secondary | ICD-10-CM | POA: Diagnosis not present

## 2020-08-08 DIAGNOSIS — M25661 Stiffness of right knee, not elsewhere classified: Secondary | ICD-10-CM | POA: Diagnosis not present

## 2020-08-10 DIAGNOSIS — M25661 Stiffness of right knee, not elsewhere classified: Secondary | ICD-10-CM | POA: Diagnosis not present

## 2020-08-10 DIAGNOSIS — M6281 Muscle weakness (generalized): Secondary | ICD-10-CM | POA: Diagnosis not present

## 2020-08-10 DIAGNOSIS — M25562 Pain in left knee: Secondary | ICD-10-CM | POA: Diagnosis not present

## 2020-08-10 DIAGNOSIS — R262 Difficulty in walking, not elsewhere classified: Secondary | ICD-10-CM | POA: Diagnosis not present

## 2020-08-11 ENCOUNTER — Ambulatory Visit: Payer: Medicare HMO | Admitting: Family Medicine

## 2020-08-12 DIAGNOSIS — M6281 Muscle weakness (generalized): Secondary | ICD-10-CM | POA: Diagnosis not present

## 2020-08-12 DIAGNOSIS — M25661 Stiffness of right knee, not elsewhere classified: Secondary | ICD-10-CM | POA: Diagnosis not present

## 2020-08-12 DIAGNOSIS — R262 Difficulty in walking, not elsewhere classified: Secondary | ICD-10-CM | POA: Diagnosis not present

## 2020-08-12 DIAGNOSIS — M25562 Pain in left knee: Secondary | ICD-10-CM | POA: Diagnosis not present

## 2020-08-16 ENCOUNTER — Other Ambulatory Visit: Payer: Self-pay | Admitting: Family Medicine

## 2020-08-16 DIAGNOSIS — M6281 Muscle weakness (generalized): Secondary | ICD-10-CM | POA: Diagnosis not present

## 2020-08-16 DIAGNOSIS — R262 Difficulty in walking, not elsewhere classified: Secondary | ICD-10-CM | POA: Diagnosis not present

## 2020-08-16 DIAGNOSIS — M25562 Pain in left knee: Secondary | ICD-10-CM | POA: Diagnosis not present

## 2020-08-16 DIAGNOSIS — M25661 Stiffness of right knee, not elsewhere classified: Secondary | ICD-10-CM | POA: Diagnosis not present

## 2020-08-17 ENCOUNTER — Ambulatory Visit (INDEPENDENT_AMBULATORY_CARE_PROVIDER_SITE_OTHER): Payer: Medicare HMO | Admitting: Family Medicine

## 2020-08-17 ENCOUNTER — Encounter: Payer: Self-pay | Admitting: Family Medicine

## 2020-08-17 ENCOUNTER — Other Ambulatory Visit: Payer: Self-pay

## 2020-08-17 ENCOUNTER — Telehealth: Payer: Self-pay | Admitting: Family Medicine

## 2020-08-17 VITALS — BP 121/82 | HR 84 | Ht 62.0 in | Wt 143.0 lb

## 2020-08-17 DIAGNOSIS — E119 Type 2 diabetes mellitus without complications: Secondary | ICD-10-CM | POA: Diagnosis not present

## 2020-08-17 DIAGNOSIS — N1831 Chronic kidney disease, stage 3a: Secondary | ICD-10-CM

## 2020-08-17 DIAGNOSIS — B070 Plantar wart: Secondary | ICD-10-CM

## 2020-08-17 DIAGNOSIS — I10 Essential (primary) hypertension: Secondary | ICD-10-CM | POA: Diagnosis not present

## 2020-08-17 DIAGNOSIS — G4700 Insomnia, unspecified: Secondary | ICD-10-CM

## 2020-08-17 DIAGNOSIS — E1122 Type 2 diabetes mellitus with diabetic chronic kidney disease: Secondary | ICD-10-CM | POA: Diagnosis not present

## 2020-08-17 LAB — POCT GLYCOSYLATED HEMOGLOBIN (HGB A1C): HbA1c, POC (prediabetic range): 5.7 % (ref 5.7–6.4)

## 2020-08-17 MED ORDER — TEMAZEPAM 15 MG PO CAPS: 15.0000 mg | ORAL_CAPSULE | Freq: Every evening | ORAL | 0 refills | Status: DC | PRN

## 2020-08-17 NOTE — Assessment & Plan Note (Signed)
A1c looks great today.  Encouraged her to continue to work at it if she is able to keep that A1c down at the next office visit that I really like to decrease her metformin to 500 mg.  Follow-up in 3 to 4 months.

## 2020-08-17 NOTE — Assessment & Plan Note (Signed)
Well-controlled.  Follow-up in 3 to 4 months.

## 2020-08-17 NOTE — Progress Notes (Signed)
Established Patient Office Visit  Subjective:  Patient ID: Bianca Mckee, female    DOB: 19-Jan-1951  Age: 70 y.o. MRN: [id number]  CC:  Chief Complaint  Patient presents with  . Diabetes    HPI Bianca Mckee presents for   Diabetes - no hypoglycemic events. No wounds or sores that are not healing well. No increased thirst or urination. Checking glucose at home. Taking medications as prescribed without any side effects.   Hypertension- Pt denies chest pain, SOB, dizziness, or heart palpitations.  Taking meds as directed w/o problems.  Denies medication side effects.     she also has a lesion on the bottom of her right foot she wants me to look at today.    She also had her left knee replacement and is doing well she is going to physical therapy 3 days/week.  She is supposed to start back to work next week.   So not sure weight sleeping well she is really been struggling its been worse the last couple of weeks.  She occasionally uses her Xanax to help her sleep and wants to know if she could increase the dose.  She is tried Ambien but it caused her to sleepwalk and fall.  She did not like metallic taste with Lunesta.  Belsomra was too costly.  The doxepin that she is currently taking does not seem to be effective at all so organ to discontinue it.  Past Medical History:  Diagnosis Date  . Anxiety   . Diabetes mellitus without complication ([place])   . GERD (gastroesophageal reflux disease)   . Hypertension 1996  . Vaginal Pap smear, abnormal     Past Surgical History:  Procedure Laterality Date  . ARTHROPLASTY Left    left knee  . CERVIX LESION DESTRUCTION    . TUBAL LIGATION      Family History  Problem Relation Age of Onset  . Lung cancer Brother 72       smoker  . Pancreatic cancer Brother   . Pancreatic cancer Brother   . Lung cancer Brother     Social History   Socioeconomic History  . Marital status: Divorced    Spouse name: Not on file  . Number of  children: 1  . Years of education: 40  . Highest education level: 12th grade  Occupational History  . Occupation: Full time     Comment: (currently out on disability due to surgery)  Tobacco Use  . Smoking status: Never Smoker  . Smokeless tobacco: Never Used  Vaping Use  . Vaping Use: Never used  Substance and Sexual Activity  . Alcohol use: Yes    Alcohol/week: 1.0 standard drink    Types: 1 Shots of liquor per week    Comment: occasionally  . Drug use: No  . Sexual activity: Yes    Birth control/protection: None  Other Topics Concern  . Not on file  Social History Narrative   Still working at [employer] in [place]. Lives alone. She recently had knee surgery. She enjoys watching t.v.    Social Determinants of Health   Financial Resource Strain: Low Risk   . Difficulty of Paying Living Expenses: Not hard at all  Food Insecurity: No Food Insecurity  . Worried About Charity fundraiser in the Last Year: Never true  . Ran Out of Food in the Last Year: Never true  Transportation Needs: No Transportation Needs  . Lack of Transportation (Medical): No  .  Lack of Transportation (Non-Medical): No  Physical Activity: Inactive  . Days of Exercise per Week: 0 days  . Minutes of Exercise per Session: 0 min  Stress: No Stress Concern Present  . Feeling of Stress : Not at all  Social Connections: Socially Isolated  . Frequency of Communication with Friends and Family: More than three times a week  . Frequency of Social Gatherings with Friends and Family: Three times a week  . Attends Religious Services: Never  . Active Member of Clubs or Organizations: No  . Attends Archivist Meetings: Never  . Marital Status: Divorced  Human resources officer Violence: Not At Risk  . Fear of Current or Ex-Partner: No  . Emotionally Abused: No  . Physically Abused: No  . Sexually Abused: No    Outpatient Medications Prior to Visit  Medication Sig Dispense Refill  . Accu-Chek Softclix  Lancets lancets     . ALPRAZolam (XANAX) 1 MG tablet Take 0.5-1 tablets (0.5-1 mg total) by mouth daily as needed for anxiety. 30 tablet 1  . aspirin 325 MG EC tablet Take by mouth.    Marland Kitchen atorvastatin (LIPITOR) 40 MG tablet TAKE 1 TABLET BY MOUTH EVERYDAY AT BEDTIME 90 tablet 1  . blood glucose meter kit and supplies Dispense based on patient and insurance preference. Use to test blood sugar once daily. Dx: E11.9 1 each 0  . buPROPion (WELLBUTRIN XL) 150 MG 24 hr tablet TAKE 1 TABLET BY MOUTH EVERY DAY 90 tablet 1  . diclofenac Sodium (VOLTAREN) 1 % GEL APPLY 4 G TOPICALLY 4 (FOUR) TIMES DAILY. 400 g 1  . glucose blood (ACCU-CHEK GUIDE) test strip USE DAILY 100 strip 11  . hydrochlorothiazide (HYDRODIURIL) 25 MG tablet TAKE 1 TABLET BY MOUTH EVERY DAY 90 tablet 0  . HYDROcodone-acetaminophen (NORCO/VICODIN) 5-325 MG tablet Take 1 tablet by mouth every 6 (six) hours as needed.    . Melatonin 10 MG TABS Take 10 mg by mouth at bedtime as needed.    . metFORMIN (GLUCOPHAGE) 1000 MG tablet TAKE 1 TABLET (1,000 MG TOTAL) BY MOUTH 2 (TWO) TIMES DAILY WITH A MEAL. 180 tablet 1  . oxyCODONE (OXY IR/ROXICODONE) 5 MG immediate release tablet Take by mouth.    . sertraline (ZOLOFT) 100 MG tablet TAKE 1 AND 1/2 TABLETS BY MOUTH EVERY DAY 135 tablet 0  . telmisartan (MICARDIS) 20 MG tablet TAKE 1 TABLET BY MOUTH EVERY DAY 90 tablet 0  . doxepin (SINEQUAN) 10 MG capsule TAKE 1 CAPSULE BY MOUTH AT BEDTIME 90 capsule 2  . linaclotide (LINZESS) 145 MCG CAPS capsule Take 1 capsule (145 mcg total) by mouth daily. (Patient not taking: No sig reported) 90 capsule 3  . omeprazole (PRILOSEC) 40 MG capsule TAKE 1 CAPSULE BY MOUTH EVERY DAY 90 capsule 1   No facility-administered medications prior to visit.    Allergies  Allergen Reactions  . Ambien [Zolpidem Tartrate] Other (See Comments)    Sleep walking, injury  . Codeine Phosphate   . Eszopiclone Other (See Comments)    Metallic taste    ROS Review of  Systems    Objective:    Physical Exam Constitutional:      Appearance: She is well-developed.  HENT:     Head: Normocephalic and atraumatic.  Cardiovascular:     Rate and Rhythm: Normal rate and regular rhythm.     Heart sounds: Normal heart sounds.  Pulmonary:     Effort: Pulmonary effort is normal.     Breath  sounds: Normal breath sounds.  Skin:    General: Skin is warm and dry.  Neurological:     Mental Status: She is alert and oriented to person, place, and time.  Psychiatric:        Behavior: Behavior normal.     BP 121/82   Pulse 84   Ht 5' 2"  (1.575 m)   Wt 143 lb (64.9 kg)   SpO2 99%   BMI 26.16 kg/m  Wt Readings from Last 3 Encounters:  08/17/20 143 lb (64.9 kg)  04/13/20 145 lb (65.8 kg)  11/19/19 146 lb (66.2 kg)     Health Maintenance Due  Topic Date Due  . Zoster Vaccines- Shingrix (2 of 2) 02/05/2019    There are no preventive care reminders to display for this patient.  Lab Results  Component Value Date   TSH 1.754 04/17/2013   Lab Results  Component Value Date   WBC 10.5 11/03/2007   HGB 14.0 11/03/2007   HCT 43.6 11/03/2007   MCV 94.8 11/03/2007   PLT 394 11/03/2007   Lab Results  Component Value Date   NA 141 04/12/2020   K 3.6 04/12/2020   CO2 26 04/12/2020   GLUCOSE 86 04/12/2020   BUN 20 04/12/2020   CREATININE 0.88 04/12/2020   BILITOT 0.4 08/27/2019   ALKPHOS 71 01/05/2016   AST 15 08/27/2019   ALT 10 08/27/2019   PROT 6.8 08/27/2019   ALBUMIN 4.4 01/05/2016   CALCIUM 9.9 04/12/2020   Lab Results  Component Value Date   CHOL 264 (H) 08/27/2019   Lab Results  Component Value Date   HDL 52 08/27/2019   Lab Results  Component Value Date   LDLCALC 189 (H) 08/27/2019   Lab Results  Component Value Date   TRIG 107 08/27/2019   Lab Results  Component Value Date   CHOLHDL 5.1 (H) 08/27/2019   Lab Results  Component Value Date   HGBA1C 5.7 08/17/2020      Assessment & Plan:   Problem List Items  Addressed This Visit      Cardiovascular and Mediastinum   HYPERTENSION, BENIGN SYSTEMIC    Well-controlled.  Follow-up in 3 to 4 months.      Relevant Orders   CBC   COMPLETE METABOLIC PANEL WITH GFR   Lipid panel     Endocrine   Type 2 diabetes mellitus with stage 3a chronic kidney disease, without long-term current use of insulin (HCC)    A1c looks great today.  Encouraged her to continue to work at it if she is able to keep that A1c down at the next office visit that I really like to decrease her metformin to 500 mg.  Follow-up in 3 to 4 months.      RESOLVED: Diabetes mellitus without complication ([place]) - Primary   Relevant Orders   POCT glycosylated hemoglobin (Hb A1C) (Completed)   CBC   COMPLETE METABOLIC PANEL WITH GFR   Lipid panel     Other   INSOMNIA NOS    Discussed options.  Recommended trial of Restoril but she absolutely cannot take the Xanax at the same time and she understands that.  She is trying a combination of some herbal supplements currently encouraged her to give that a try for another week before deciding to start the Restoril      Relevant Medications   temazepam (RESTORIL) 15 MG capsule    Other Visit Diagnoses    Plantar wart  Plantar wart -  on the bottom of the right foot near the ball of the foot and on the medial side of the great toe.  Cryotherapy performed and patient tolerated well.  Cryotherapy Procedure Note  Pre-operative Diagnosis: plantar wart  Post-operative Diagnosis: same  Locations: Bottom of the right foot and over the lateral great left toe  Indications: pain  Anesthesia: not needed.   Procedure Details  Patient informed of risks (permanent scarring, infection, light or dark discoloration, bleeding, infection, weakness, numbness and recurrence of the lesion) and benefits of the procedure and verbal informed consent obtained.  The areas are treated with liquid nitrogen therapy, frozen until ice ball extended 2  mm beyond lesion, allowed to thaw, and treated again. The patient tolerated procedure well.  The patient was instructed on post-op care, warned that there may be blister formation, redness and pain. Recommend OTC analgesia as needed for pain.  Condition: Stable  Complications: none.  Plan: 1. Instructed to keep the area dry and covered for 24-48h and clean thereafter. 2. Warning signs of infection were reviewed.   3. Recommended that the patient use OTC acetaminophen as needed for pain.  4. Return PRN.    Meds ordered this encounter  Medications  . temazepam (RESTORIL) 15 MG capsule    Sig: Take 1 capsule (15 mg total) by mouth at bedtime as needed for sleep.    Dispense:  30 capsule    Refill:  0    Follow-up: Return in about 4 months (around 12/17/2020) for Diabetes follow-up.    Beatrice Lecher, MD

## 2020-08-17 NOTE — Assessment & Plan Note (Signed)
Discussed options.  Recommended trial of Restoril but she absolutely cannot take the Xanax at the same time and she understands that.  She is trying a combination of some herbal supplements currently encouraged her to give that a try for another week before deciding to start the Restoril

## 2020-08-17 NOTE — Progress Notes (Signed)
  Chronic Care Management   Outreach Note  08/17/2020 Name: CLYTEE HEINRICH MRN: [id number] DOB: [birth year]  Referred by: Hali Marry, MD Reason for referral : No chief complaint on file.   A second unsuccessful telephone outreach was attempted today. The patient was referred to pharmacist for assistance with care management and care coordination.  Follow Up Plan:   Lauretta Grill Upstream Scheduler

## 2020-08-18 ENCOUNTER — Other Ambulatory Visit: Payer: Self-pay | Admitting: Family Medicine

## 2020-08-18 DIAGNOSIS — R262 Difficulty in walking, not elsewhere classified: Secondary | ICD-10-CM | POA: Diagnosis not present

## 2020-08-18 DIAGNOSIS — M25562 Pain in left knee: Secondary | ICD-10-CM | POA: Diagnosis not present

## 2020-08-18 DIAGNOSIS — M19031 Primary osteoarthritis, right wrist: Secondary | ICD-10-CM | POA: Diagnosis not present

## 2020-08-18 DIAGNOSIS — M25661 Stiffness of right knee, not elsewhere classified: Secondary | ICD-10-CM | POA: Diagnosis not present

## 2020-08-18 DIAGNOSIS — M6281 Muscle weakness (generalized): Secondary | ICD-10-CM | POA: Diagnosis not present

## 2020-08-18 LAB — LIPID PANEL
Cholesterol: 205 mg/dL — ABNORMAL HIGH (ref <200)
HDL: 53 mg/dL (ref >=50)
LDL Cholesterol (Calc): 128 mg/dL (calc) — ABNORMAL HIGH
Non-HDL Cholesterol (Calc): 152 mg/dL (calc) — ABNORMAL HIGH (ref <130)
Total CHOL/HDL Ratio: 3.9 (calc) (ref <5.0)
Triglycerides: 126 mg/dL (ref <150)

## 2020-08-18 LAB — COMPLETE METABOLIC PANEL WITH GFR
AG Ratio: 1.7 (calc) (ref 1.0–2.5)
ALT: 11 U/L (ref 6–29)
AST: 15 U/L (ref 10–35)
Albumin: 4.3 g/dL (ref 3.6–5.1)
Alkaline phosphatase (APISO): 95 U/L (ref 37–153)
BUN/Creatinine Ratio: 21 (calc) (ref 6–22)
BUN: 21 mg/dL (ref 7–25)
CO2: 27 mmol/L (ref 20–32)
Calcium: 9.8 mg/dL (ref 8.6–10.4)
Chloride: 104 mmol/L (ref 98–110)
Creat: 1.01 mg/dL — ABNORMAL HIGH (ref 0.50–0.99)
GFR, Est African American: 67 mL/min/{1.73_m2} (ref >=60)
GFR, Est Non African American: 58 mL/min/{1.73_m2} — ABNORMAL LOW (ref >=60)
Globulin: 2.6 g/dL (calc) (ref 1.9–3.7)
Glucose, Bld: 127 mg/dL — ABNORMAL HIGH (ref 65–99)
Potassium: 4.1 mmol/L (ref 3.5–5.3)
Sodium: 141 mmol/L (ref 135–146)
Total Bilirubin: 0.4 mg/dL (ref 0.2–1.2)
Total Protein: 6.9 g/dL (ref 6.1–8.1)

## 2020-08-18 LAB — CBC
HCT: 39.4 % (ref 35.0–45.0)
Hemoglobin: 12.8 g/dL (ref 11.7–15.5)
MCH: 30.5 pg (ref 27.0–33.0)
MCHC: 32.5 g/dL (ref 32.0–36.0)
MCV: 94 fL (ref 80.0–100.0)
MPV: 11 fL (ref 7.5–12.5)
Platelets: 270 10*3/uL (ref 140–400)
RBC: 4.19 10*6/uL (ref 3.80–5.10)
RDW: 12 % (ref 11.0–15.0)
WBC: 7.4 10*3/uL (ref 3.8–10.8)

## 2020-08-19 DIAGNOSIS — M6281 Muscle weakness (generalized): Secondary | ICD-10-CM | POA: Diagnosis not present

## 2020-08-19 DIAGNOSIS — M25562 Pain in left knee: Secondary | ICD-10-CM | POA: Diagnosis not present

## 2020-08-19 DIAGNOSIS — M25661 Stiffness of right knee, not elsewhere classified: Secondary | ICD-10-CM | POA: Diagnosis not present

## 2020-08-19 DIAGNOSIS — R262 Difficulty in walking, not elsewhere classified: Secondary | ICD-10-CM | POA: Diagnosis not present

## 2020-08-22 DIAGNOSIS — M25661 Stiffness of right knee, not elsewhere classified: Secondary | ICD-10-CM | POA: Diagnosis not present

## 2020-08-22 DIAGNOSIS — M6281 Muscle weakness (generalized): Secondary | ICD-10-CM | POA: Diagnosis not present

## 2020-08-22 DIAGNOSIS — R262 Difficulty in walking, not elsewhere classified: Secondary | ICD-10-CM | POA: Diagnosis not present

## 2020-08-22 DIAGNOSIS — M25562 Pain in left knee: Secondary | ICD-10-CM | POA: Diagnosis not present

## 2020-08-29 DIAGNOSIS — I10 Essential (primary) hypertension: Secondary | ICD-10-CM | POA: Diagnosis not present

## 2020-08-29 DIAGNOSIS — N871 Moderate cervical dysplasia: Secondary | ICD-10-CM | POA: Diagnosis not present

## 2020-08-29 DIAGNOSIS — N891 Moderate vaginal dysplasia: Secondary | ICD-10-CM | POA: Diagnosis not present

## 2020-08-30 DIAGNOSIS — M6281 Muscle weakness (generalized): Secondary | ICD-10-CM | POA: Diagnosis not present

## 2020-08-30 DIAGNOSIS — M25562 Pain in left knee: Secondary | ICD-10-CM | POA: Diagnosis not present

## 2020-08-30 DIAGNOSIS — M25661 Stiffness of right knee, not elsewhere classified: Secondary | ICD-10-CM | POA: Diagnosis not present

## 2020-08-30 DIAGNOSIS — R262 Difficulty in walking, not elsewhere classified: Secondary | ICD-10-CM | POA: Diagnosis not present

## 2020-09-01 DIAGNOSIS — M25562 Pain in left knee: Secondary | ICD-10-CM | POA: Diagnosis not present

## 2020-09-01 DIAGNOSIS — R262 Difficulty in walking, not elsewhere classified: Secondary | ICD-10-CM | POA: Diagnosis not present

## 2020-09-01 DIAGNOSIS — M6281 Muscle weakness (generalized): Secondary | ICD-10-CM | POA: Diagnosis not present

## 2020-09-01 DIAGNOSIS — M25661 Stiffness of right knee, not elsewhere classified: Secondary | ICD-10-CM | POA: Diagnosis not present

## 2020-09-02 ENCOUNTER — Encounter: Payer: Self-pay | Admitting: Family Medicine

## 2020-09-02 DIAGNOSIS — G4700 Insomnia, unspecified: Secondary | ICD-10-CM

## 2020-09-02 MED ORDER — ALPRAZOLAM 1 MG PO TABS: 0.5000 mg | ORAL_TABLET | Freq: Every day | ORAL | 1 refills | Status: DC | PRN

## 2020-09-02 NOTE — Telephone Encounter (Signed)
Meds ordered this encounter  Medications   ALPRAZolam (XANAX) 1 MG tablet    Sig: Take 0.5-1 tablets (0.5-1 mg total) by mouth daily as needed for anxiety.    Dispense:  30 tablet    Refill:  1    Not to exceed 2 additional fills before 06/06/2020

## 2020-09-06 DIAGNOSIS — M25661 Stiffness of right knee, not elsewhere classified: Secondary | ICD-10-CM | POA: Diagnosis not present

## 2020-09-06 DIAGNOSIS — M6281 Muscle weakness (generalized): Secondary | ICD-10-CM | POA: Diagnosis not present

## 2020-09-06 DIAGNOSIS — M25562 Pain in left knee: Secondary | ICD-10-CM | POA: Diagnosis not present

## 2020-09-06 DIAGNOSIS — R262 Difficulty in walking, not elsewhere classified: Secondary | ICD-10-CM | POA: Diagnosis not present

## 2020-09-07 DIAGNOSIS — M6281 Muscle weakness (generalized): Secondary | ICD-10-CM | POA: Diagnosis not present

## 2020-09-07 DIAGNOSIS — M25661 Stiffness of right knee, not elsewhere classified: Secondary | ICD-10-CM | POA: Diagnosis not present

## 2020-09-07 DIAGNOSIS — R262 Difficulty in walking, not elsewhere classified: Secondary | ICD-10-CM | POA: Diagnosis not present

## 2020-09-07 DIAGNOSIS — M25562 Pain in left knee: Secondary | ICD-10-CM | POA: Diagnosis not present

## 2020-09-12 ENCOUNTER — Telehealth: Payer: Self-pay | Admitting: Family Medicine

## 2020-09-12 NOTE — Progress Notes (Signed)
  Chronic Care Management   Outreach Note  09/12/2020 Name: AVNEET ASHMORE MRN: [id number] DOB: 26-Nov-1950  Referred by: Hali Marry, MD Reason for referral : No chief complaint on file.   Third unsuccessful telephone outreach was attempted today. The patient was referred to the pharmacist for assistance with care management and care coordination.   Follow Up Plan:   Lauretta Grill Upstream Scheduler

## 2020-09-13 DIAGNOSIS — M25661 Stiffness of right knee, not elsewhere classified: Secondary | ICD-10-CM | POA: Diagnosis not present

## 2020-09-13 DIAGNOSIS — M6281 Muscle weakness (generalized): Secondary | ICD-10-CM | POA: Diagnosis not present

## 2020-09-13 DIAGNOSIS — R262 Difficulty in walking, not elsewhere classified: Secondary | ICD-10-CM | POA: Diagnosis not present

## 2020-09-13 DIAGNOSIS — M25562 Pain in left knee: Secondary | ICD-10-CM | POA: Diagnosis not present

## 2020-09-15 ENCOUNTER — Other Ambulatory Visit: Payer: Self-pay | Admitting: Family Medicine

## 2020-09-15 DIAGNOSIS — G4700 Insomnia, unspecified: Secondary | ICD-10-CM

## 2020-09-15 DIAGNOSIS — M25562 Pain in left knee: Secondary | ICD-10-CM | POA: Diagnosis not present

## 2020-09-15 DIAGNOSIS — M25661 Stiffness of right knee, not elsewhere classified: Secondary | ICD-10-CM | POA: Diagnosis not present

## 2020-09-15 DIAGNOSIS — M6281 Muscle weakness (generalized): Secondary | ICD-10-CM | POA: Diagnosis not present

## 2020-09-15 DIAGNOSIS — R262 Difficulty in walking, not elsewhere classified: Secondary | ICD-10-CM | POA: Diagnosis not present

## 2020-09-26 ENCOUNTER — Encounter: Payer: Self-pay | Admitting: Family Medicine

## 2020-10-10 DIAGNOSIS — I10 Essential (primary) hypertension: Secondary | ICD-10-CM | POA: Diagnosis not present

## 2020-10-10 DIAGNOSIS — N871 Moderate cervical dysplasia: Secondary | ICD-10-CM | POA: Diagnosis not present

## 2020-10-10 DIAGNOSIS — F419 Anxiety disorder, unspecified: Secondary | ICD-10-CM | POA: Diagnosis not present

## 2020-10-10 DIAGNOSIS — N891 Moderate vaginal dysplasia: Secondary | ICD-10-CM | POA: Diagnosis not present

## 2020-10-20 DIAGNOSIS — Z96652 Presence of left artificial knee joint: Secondary | ICD-10-CM | POA: Diagnosis not present

## 2020-10-31 ENCOUNTER — Other Ambulatory Visit: Payer: Self-pay | Admitting: Family Medicine

## 2020-10-31 DIAGNOSIS — G4700 Insomnia, unspecified: Secondary | ICD-10-CM

## 2020-11-05 ENCOUNTER — Other Ambulatory Visit: Payer: Self-pay | Admitting: Family Medicine

## 2020-11-05 DIAGNOSIS — F331 Major depressive disorder, recurrent, moderate: Secondary | ICD-10-CM

## 2020-11-10 ENCOUNTER — Other Ambulatory Visit: Payer: Self-pay | Admitting: Family Medicine

## 2020-11-22 ENCOUNTER — Encounter: Payer: Self-pay | Admitting: Family Medicine

## 2020-11-29 ENCOUNTER — Other Ambulatory Visit: Payer: Self-pay | Admitting: Family Medicine

## 2020-11-29 DIAGNOSIS — G4700 Insomnia, unspecified: Secondary | ICD-10-CM

## 2020-12-01 DIAGNOSIS — M1811 Unilateral primary osteoarthritis of first carpometacarpal joint, right hand: Secondary | ICD-10-CM | POA: Diagnosis not present

## 2020-12-22 ENCOUNTER — Ambulatory Visit: Payer: Medicare HMO | Admitting: Family Medicine

## 2021-01-05 ENCOUNTER — Ambulatory Visit (INDEPENDENT_AMBULATORY_CARE_PROVIDER_SITE_OTHER): Payer: Medicare HMO | Admitting: Family Medicine

## 2021-01-05 ENCOUNTER — Encounter: Payer: Self-pay | Admitting: Family Medicine

## 2021-01-05 ENCOUNTER — Other Ambulatory Visit: Payer: Self-pay

## 2021-01-05 VITALS — BP 130/76 | HR 67 | Ht 62.0 in | Wt 145.0 lb

## 2021-01-05 DIAGNOSIS — G4700 Insomnia, unspecified: Secondary | ICD-10-CM

## 2021-01-05 DIAGNOSIS — Z780 Asymptomatic menopausal state: Secondary | ICD-10-CM | POA: Diagnosis not present

## 2021-01-05 DIAGNOSIS — E119 Type 2 diabetes mellitus without complications: Secondary | ICD-10-CM

## 2021-01-05 DIAGNOSIS — I10 Essential (primary) hypertension: Secondary | ICD-10-CM | POA: Diagnosis not present

## 2021-01-05 DIAGNOSIS — E785 Hyperlipidemia, unspecified: Secondary | ICD-10-CM

## 2021-01-05 DIAGNOSIS — E1122 Type 2 diabetes mellitus with diabetic chronic kidney disease: Secondary | ICD-10-CM

## 2021-01-05 DIAGNOSIS — N1831 Chronic kidney disease, stage 3a: Secondary | ICD-10-CM | POA: Diagnosis not present

## 2021-01-05 LAB — POCT GLYCOSYLATED HEMOGLOBIN (HGB A1C): Hemoglobin A1C: 6 % — AB (ref 4.0–5.6)

## 2021-01-05 MED ORDER — BELSOMRA 5 MG PO TABS: 5.0000 mg | ORAL_TABLET | Freq: Every evening | ORAL | 0 refills | Status: DC | PRN

## 2021-01-05 MED ORDER — ATORVASTATIN CALCIUM 40 MG PO TABS: ORAL_TABLET | ORAL | 3 refills | Status: DC

## 2021-01-05 NOTE — Assessment & Plan Note (Signed)
Well controlled. Continue current regimen. Follow up in  4 mo 

## 2021-01-05 NOTE — Progress Notes (Signed)
Established Patient Office Visit  Subjective:  Patient ID: Bianca Mckee, female    DOB: May 25, 1950  Age: 70 y.o. MRN: [id number]  CC:  Chief Complaint  Patient presents with   Diabetes    HPI MARIEKE LUBKE presents for   Diabetes - no hypoglycemic events. No wounds or sores that are not healing well. No increased thirst or urination. Checking glucose at home. Taking medications as prescribed without any side effects.  She still having some significant struggles with insomnia.  Lunesta caused a metallic taste in her mouth, rest Marol which is the 1 we tried recently was not effective it did not seem to help at all.  Doxepin really was not that effective either.  Ambien caused sleepwalking only once but it did occur.  We tried to get Belsomra covered at 1 point but it was $400.  Follow-up mood-she said she ran out of the Wellbutrin and just decided to stay off of it.  She is still taking 150 mg of sertraline and tolerating that well.  Work has been set very stressful because they have been short staffed but overall she feels like she is doing okay but still struggling with sleep quality as above.  Past Medical History:  Diagnosis Date   Anxiety    Diabetes mellitus without complication (HCC)    GERD (gastroesophageal reflux disease)    Hypertension 1996   Vaginal Pap smear, abnormal     Past Surgical History:  Procedure Laterality Date   ARTHROPLASTY Left    left knee   CERVIX LESION DESTRUCTION     TUBAL LIGATION      Family History  Problem Relation Age of Onset   Lung cancer Brother 44       smoker   Pancreatic cancer Brother    Pancreatic cancer Brother    Lung cancer Brother     Social History   Socioeconomic History   Marital status: Divorced    Spouse name: Not on file   Number of children: 1   Years of education: 12   Highest education level: 12th grade  Occupational History   Occupation: Full time     Comment: (currently out on disability due to  surgery)  Tobacco Use   Smoking status: Never   Smokeless tobacco: Never  Vaping Use   Vaping Use: Never used  Substance and Sexual Activity   Alcohol use: Yes    Alcohol/week: 1.0 standard drink    Types: 1 Shots of liquor per week    Comment: occasionally   Drug use: No   Sexual activity: Yes    Birth control/protection: None  Other Topics Concern   Not on file  Social History Narrative   Still working at [employer] in [place]. Lives alone. She recently had knee surgery. She enjoys watching t.v.    Social Determinants of Health   Financial Resource Strain: Low Risk    Difficulty of Paying Living Expenses: Not hard at all  Food Insecurity: No Food Insecurity   Worried About Charity fundraiser in the Last Year: Never true   Arboriculturist in the Last Year: Never true  Transportation Needs: No Transportation Needs   Lack of Transportation (Medical): No   Lack of Transportation (Non-Medical): No  Physical Activity: Inactive   Days of Exercise per Week: 0 days   Minutes of Exercise per Session: 0 min  Stress: No Stress Concern Present   Feeling of Stress : Not  at all  Social Connections: Socially Isolated   Frequency of Communication with Friends and Family: More than three times a week   Frequency of Social Gatherings with Friends and Family: Three times a week   Attends Religious Services: Never   Active Member of Clubs or Organizations: No   Attends Archivist Meetings: Never   Marital Status: Divorced  Human resources officer Violence: Not At Risk   Fear of Current or Ex-Partner: No   Emotionally Abused: No   Physically Abused: No   Sexually Abused: No    Outpatient Medications Prior to Visit  Medication Sig Dispense Refill   Accu-Chek Softclix Lancets lancets      ALPRAZolam (XANAX) 1 MG tablet TAKE 1/2-1 TABLET BY MOUTH DAILY AS NEEDED FOR ANXIETY 30 tablet 1   blood glucose meter kit and supplies Dispense based on patient and insurance preference. Use  to test blood sugar once daily. Dx: E11.9 1 each 0   diclofenac Sodium (VOLTAREN) 1 % GEL APPLY 4 G TOPICALLY 4 (FOUR) TIMES DAILY. 400 g 1   glucose blood (ACCU-CHEK GUIDE) test strip USE DAILY 100 strip 11   hydrochlorothiazide (HYDRODIURIL) 25 MG tablet TAKE 1 TABLET BY MOUTH EVERY DAY 90 tablet 0   Melatonin 10 MG TABS Take 10 mg by mouth at bedtime as needed.     metFORMIN (GLUCOPHAGE) 1000 MG tablet TAKE 1 TABLET (1,000 MG TOTAL) BY MOUTH 2 (TWO) TIMES DAILY WITH A MEAL. 180 tablet 1   sertraline (ZOLOFT) 100 MG tablet TAKE 1.5 TABLETS BY MOUTH EVERY DAY 135 tablet 0   telmisartan (MICARDIS) 20 MG tablet TAKE 1 TABLET BY MOUTH EVERY DAY 90 tablet 0   aspirin 325 MG EC tablet Take by mouth.     atorvastatin (LIPITOR) 40 MG tablet TAKE 1 TABLET BY MOUTH EVERYDAY AT BEDTIME 90 tablet 1   buPROPion (WELLBUTRIN XL) 150 MG 24 hr tablet TAKE 1 TABLET BY MOUTH EVERY DAY 90 tablet 1   temazepam (RESTORIL) 15 MG capsule TAKE 1 CAPSULE BY MOUTH AT BEDTIME AS NEEDED FOR SLEEP 30 capsule 0   No facility-administered medications prior to visit.    Allergies  Allergen Reactions   Ambien [Zolpidem Tartrate] Other (See Comments)    Sleep walking, injury   Codeine Phosphate    Eszopiclone Other (See Comments)    Metallic taste    ROS Review of Systems    Objective:    Physical Exam Constitutional:      Appearance: Normal appearance. She is well-developed.  HENT:     Head: Normocephalic and atraumatic.  Cardiovascular:     Rate and Rhythm: Normal rate and regular rhythm.     Heart sounds: Normal heart sounds.  Pulmonary:     Effort: Pulmonary effort is normal.     Breath sounds: Normal breath sounds.  Skin:    General: Skin is warm and dry.  Neurological:     Mental Status: She is alert and oriented to person, place, and time.  Psychiatric:        Behavior: Behavior normal.    BP 130/76   Pulse 67   Ht 5' 2"  (1.575 m)   Wt 145 lb (65.8 kg)   SpO2 99%   BMI 26.52 kg/m  Wt  Readings from Last 3 Encounters:  01/05/21 145 lb (65.8 kg)  08/17/20 143 lb (64.9 kg)  04/13/20 145 lb (65.8 kg)     Health Maintenance Due  Topic Date Due   Zoster Vaccines-  Shingrix (2 of 2) 02/05/2019   COLONOSCOPY (Pts 45-57yr Insurance coverage will need to be confirmed)  12/19/2020   DEXA SCAN  12/23/2020    There are no preventive care reminders to display for this patient.  Lab Results  Component Value Date   TSH 1.754 04/17/2013   Lab Results  Component Value Date   WBC 7.4 08/17/2020   HGB 12.8 08/17/2020   HCT 39.4 08/17/2020   MCV 94.0 08/17/2020   PLT 270 08/17/2020   Lab Results  Component Value Date   NA 141 08/17/2020   K 4.1 08/17/2020   CO2 27 08/17/2020   GLUCOSE 127 (H) 08/17/2020   BUN 21 08/17/2020   CREATININE 1.01 (H) 08/17/2020   BILITOT 0.4 08/17/2020   ALKPHOS 71 01/05/2016   AST 15 08/17/2020   ALT 11 08/17/2020   PROT 6.9 08/17/2020   ALBUMIN 4.4 01/05/2016   CALCIUM 9.8 08/17/2020   Lab Results  Component Value Date   CHOL 205 (H) 08/17/2020   Lab Results  Component Value Date   HDL 53 08/17/2020   Lab Results  Component Value Date   LDLCALC 128 (H) 08/17/2020   Lab Results  Component Value Date   TRIG 126 08/17/2020   Lab Results  Component Value Date   CHOLHDL 3.9 08/17/2020   Lab Results  Component Value Date   HGBA1C 6.0 (A) 01/05/2021      Assessment & Plan:   Problem List Items Addressed This Visit       Cardiovascular and Mediastinum   HYPERTENSION, BENIGN SYSTEMIC    Well controlled. Continue current regimen. Follow up in  4 mo       Relevant Medications   atorvastatin (LIPITOR) 40 MG tablet     Endocrine   Type 2 diabetes mellitus with stage 3a chronic kidney disease, without long-term current use of insulin (HCC)    Well controlled. Continue current regimen. Follow up in  4 mo       Relevant Medications   atorvastatin (LIPITOR) 40 MG tablet     Other   INSOMNIA NOS    Please see note  above.  Discussed options.  It looks like Belsomra may actually be covered on her insurance now so we will start with that 1.  [facility]      Relevant Medications   Suvorexant (BELSOMRA) 5 MG TABS   Hyperlipidemia LDL goal <100   Relevant Medications   atorvastatin (LIPITOR) 40 MG tablet   Other Visit Diagnoses     [place]    -  Primary   Relevant Medications   atorvastatin (LIPITOR) 40 MG tablet   Other Relevant Orders   POCT glycosylated hemoglobin (Hb A1C) (Completed)   Post-menopausal       Relevant Orders   DG Bone Density       Meds ordered this encounter  Medications   Suvorexant (BELSOMRA) 5 MG TABS    Sig: Take 5 mg by mouth at bedtime as needed.    Dispense:  30 tablet    Refill:  0   atorvastatin (LIPITOR) 40 MG tablet    Sig: TAKE 1 TABLET BY MOUTH EVERYDAY AT BEDTIME    Dispense:  90 tablet    Refill:  3    Follow-up: Return in about 4 months (around 05/08/2021) for Diabetes follow-up and labs. .Beatrice Lecher MD

## 2021-01-05 NOTE — Assessment & Plan Note (Signed)
Please see note above.  Discussed options.  It looks like Belsomra may actually be covered on her insurance now so we will start with that 1.  If not consider trying to get the [facility] covered.

## 2021-01-23 ENCOUNTER — Encounter: Payer: Self-pay | Admitting: Family Medicine

## 2021-01-23 MED ORDER — METFORMIN HCL 1000 MG PO TABS: 1000.0000 mg | ORAL_TABLET | Freq: Two times a day (BID) | ORAL | 1 refills | Status: DC

## 2021-01-26 ENCOUNTER — Other Ambulatory Visit: Payer: Self-pay | Admitting: Family Medicine

## 2021-02-08 ENCOUNTER — Other Ambulatory Visit: Payer: Medicare HMO

## 2021-02-09 ENCOUNTER — Other Ambulatory Visit: Payer: Self-pay | Admitting: Family Medicine

## 2021-02-15 ENCOUNTER — Ambulatory Visit (INDEPENDENT_AMBULATORY_CARE_PROVIDER_SITE_OTHER): Payer: Medicare HMO

## 2021-02-15 ENCOUNTER — Other Ambulatory Visit: Payer: Self-pay

## 2021-02-15 DIAGNOSIS — M85851 Other specified disorders of bone density and structure, right thigh: Secondary | ICD-10-CM | POA: Diagnosis not present

## 2021-02-15 DIAGNOSIS — Z780 Asymptomatic menopausal state: Secondary | ICD-10-CM

## 2021-02-15 DIAGNOSIS — M85852 Other specified disorders of bone density and structure, left thigh: Secondary | ICD-10-CM | POA: Diagnosis not present

## 2021-02-17 NOTE — Progress Notes (Signed)
Hi Bianca Mckee, your bone density test shows a T score of -2.2 which is consistent with mildly thin bones also called osteopenia.   The current recommendation for osteopenia (mildly thin bones) treatment includes:   #1 calcium-total of 1200 mg of calcium daily.  If you eat a very calcium rich diet you may be able to obtain that without a supplement.  If not, then I recommend calcium 500 mg twice a day.  There are several products over-the-counter such as Caltrate D and Viactiv chews which are great options that contain calcium and vitamin D. #2 vitamin D-recommend 800 international units daily. #3 exercise-recommend 30 minutes of weightbearing exercise 3 days a week.  Resistance training ,such as doing bands and light weights, can be particularly helpful.

## 2021-02-21 ENCOUNTER — Encounter: Payer: Self-pay | Admitting: Family Medicine

## 2021-02-27 DIAGNOSIS — N871 Moderate cervical dysplasia: Secondary | ICD-10-CM | POA: Diagnosis not present

## 2021-03-02 DIAGNOSIS — M1811 Unilateral primary osteoarthritis of first carpometacarpal joint, right hand: Secondary | ICD-10-CM | POA: Diagnosis not present

## 2021-03-14 ENCOUNTER — Other Ambulatory Visit: Payer: Self-pay

## 2021-03-14 ENCOUNTER — Other Ambulatory Visit: Payer: Self-pay | Admitting: Family Medicine

## 2021-03-14 DIAGNOSIS — G4700 Insomnia, unspecified: Secondary | ICD-10-CM

## 2021-03-14 MED ORDER — ALPRAZOLAM 1 MG PO TABS: ORAL_TABLET | ORAL | 1 refills | Status: DC

## 2021-04-10 ENCOUNTER — Encounter: Payer: Self-pay | Admitting: Family Medicine

## 2021-04-10 ENCOUNTER — Telehealth (INDEPENDENT_AMBULATORY_CARE_PROVIDER_SITE_OTHER): Payer: Medicare HMO | Admitting: Family Medicine

## 2021-04-10 ENCOUNTER — Other Ambulatory Visit: Payer: Self-pay

## 2021-04-10 VITALS — BP 131/78 | Temp 101.8°F | Ht 62.0 in | Wt 143.0 lb

## 2021-04-10 DIAGNOSIS — U071 COVID-19: Secondary | ICD-10-CM

## 2021-04-10 DIAGNOSIS — R5081 Fever presenting with conditions classified elsewhere: Secondary | ICD-10-CM | POA: Diagnosis not present

## 2021-04-10 DIAGNOSIS — Z20822 Contact with and (suspected) exposure to covid-19: Secondary | ICD-10-CM | POA: Diagnosis not present

## 2021-04-10 NOTE — Progress Notes (Addendum)
° ° °  Virtual Visit via Video Note  I connected with Bianca Mckee on 04/11/21 at  2:00 PM EST by a video enabled telemedicine application and verified that I am speaking with the correct person using two identifiers.   I discussed the limitations of evaluation and management by telemedicine and the availability of in person appointments. The patient expressed understanding and agreed to proceed.  Patient location: at home Provider location: in office  Subjective:    CC:   Chief Complaint  Patient presents with   Cough    Dry cough, fever, chills, 3 days. Patient is scheduled for covid test this afternoon at 4:15pm.     LFY:BOFBP   She complains of feeling ill for about 3 days including a dry cough, fever and chills.  She several scheduled for COVID test and flu swab this afternoon at 4:15.  Temperature this morning was 101.8.  She said she had a bad headache the first day that it started as well as some nasal congestion.  No sore throat.  Past medical history, Surgical history, Family history not pertinant except as noted below, Social history, Allergies, and medications have been entered into the medical record, reviewed, and corrections made.    Objective:    General: Speaking clearly in complete sentences without any shortness of breath.  Alert and oriented x3.  Normal judgment. No apparent acute distress.    Impression and Recommendations:    Problem List Items Addressed This Visit   None Visit Diagnoses     Fever in other diseases    -  Primary   COVID-19           She is getting a rapid flu and COVID test on this afternoon through [facility] so she will send [place] a note back to let us know if we need to treat with either Tamiflu plaques of lid or treat for an acute viral illness.  Also more than happy to provide a work note if needed.  In the meantime continue with symptomatic care.  She did send [place] a note back today letting us know that she did test positive for  COVID-19.  Discussed option of antivirals with her.  She is currently medium risk for complications and discussed pros and cons of taking the medication.  No orders of the defined types were placed in this encounter.   No orders of the defined types were placed in this encounter.    I discussed the assessment and treatment plan with the patient. The patient was provided an opportunity to ask questions and all were answered. The patient agreed with the plan and demonstrated an understanding of the instructions.   The patient was advised to call back or seek an in-person evaluation if the symptoms worsen or if the condition fails to improve as anticipated.   Beatrice Lecher, MD

## 2021-04-12 MED ORDER — HYDROCODONE BIT-HOMATROP MBR 5-1.5 MG/5ML PO SOLN
5.0000 mL | Freq: Every evening | ORAL | 0 refills | Status: DC | PRN
Start: 2021-04-12 — End: 2021-04-24

## 2021-04-12 MED ORDER — BENZONATATE 200 MG PO CAPS: 200.0000 mg | ORAL_CAPSULE | Freq: Two times a day (BID) | ORAL | 0 refills | Status: DC | PRN

## 2021-04-12 NOTE — Telephone Encounter (Signed)
Meds ordered this encounter  Medications   benzonatate (TESSALON) 200 MG capsule    Sig: Take 1 capsule (200 mg total) by mouth 2 (two) times daily as needed for cough.    Dispense:  20 capsule    Refill:  0   HYDROcodone bit-homatropine (HYCODAN) 5-1.5 MG/5ML syrup    Sig: Take 5-10 mLs by mouth at bedtime as needed for cough.    Dispense:  60 mL    Refill:  0

## 2021-04-12 NOTE — Addendum Note (Signed)
Addended by: Beatrice Lecher D on: 04/12/2021 12:38 PM   Modules accepted: Orders

## 2021-04-17 ENCOUNTER — Encounter: Payer: Self-pay | Admitting: Family Medicine

## 2021-04-17 NOTE — Telephone Encounter (Signed)
Just encourage hydration and start try to increase food intake with healthy food choices increase movement and range of motion.  If she still not feeling better by the end of the week I am happy to see her

## 2021-04-24 MED ORDER — PREDNISONE 20 MG PO TABS: 40.0000 mg | ORAL_TABLET | Freq: Every day | ORAL | 0 refills | Status: DC

## 2021-04-24 MED ORDER — HYDROCODONE BIT-HOMATROP MBR 5-1.5 MG/5ML PO SOLN: 5.0000 mL | Freq: Every evening | ORAL | 0 refills | Status: DC | PRN

## 2021-04-24 NOTE — Telephone Encounter (Signed)
Meds ordered this encounter  Medications   HYDROcodone bit-homatropine (HYCODAN) 5-1.5 MG/5ML syrup    Sig: Take 5-10 mLs by mouth at bedtime as needed for cough.    Dispense:  60 mL    Refill:  0   predniSONE (DELTASONE) 20 MG tablet    Sig: Take 2 tablets (40 mg total) by mouth daily with breakfast.    Dispense:  10 tablet    Refill:  0   Return if not feeling better by the end fo the week

## 2021-04-25 DIAGNOSIS — Z96652 Presence of left artificial knee joint: Secondary | ICD-10-CM | POA: Diagnosis not present

## 2021-04-25 DIAGNOSIS — M25562 Pain in left knee: Secondary | ICD-10-CM | POA: Diagnosis not present

## 2021-05-01 DIAGNOSIS — W19XXXA Unspecified fall, initial encounter: Secondary | ICD-10-CM | POA: Diagnosis not present

## 2021-05-01 DIAGNOSIS — R0781 Pleurodynia: Secondary | ICD-10-CM | POA: Diagnosis not present

## 2021-05-01 DIAGNOSIS — G4489 Other headache syndrome: Secondary | ICD-10-CM | POA: Diagnosis not present

## 2021-05-01 DIAGNOSIS — R52 Pain, unspecified: Secondary | ICD-10-CM | POA: Diagnosis not present

## 2021-05-02 ENCOUNTER — Other Ambulatory Visit: Payer: Self-pay | Admitting: Family Medicine

## 2021-05-02 DIAGNOSIS — R262 Difficulty in walking, not elsewhere classified: Secondary | ICD-10-CM | POA: Diagnosis not present

## 2021-05-02 DIAGNOSIS — M6281 Muscle weakness (generalized): Secondary | ICD-10-CM | POA: Diagnosis not present

## 2021-05-02 DIAGNOSIS — M25562 Pain in left knee: Secondary | ICD-10-CM | POA: Diagnosis not present

## 2021-05-02 DIAGNOSIS — R293 Abnormal posture: Secondary | ICD-10-CM | POA: Diagnosis not present

## 2021-05-02 DIAGNOSIS — Z1231 Encounter for screening mammogram for malignant neoplasm of breast: Secondary | ICD-10-CM

## 2021-05-03 ENCOUNTER — Other Ambulatory Visit: Payer: Self-pay | Admitting: Family Medicine

## 2021-05-04 ENCOUNTER — Ambulatory Visit (INDEPENDENT_AMBULATORY_CARE_PROVIDER_SITE_OTHER): Payer: Medicare HMO

## 2021-05-04 ENCOUNTER — Ambulatory Visit (INDEPENDENT_AMBULATORY_CARE_PROVIDER_SITE_OTHER): Payer: Medicare HMO | Admitting: Family Medicine

## 2021-05-04 ENCOUNTER — Other Ambulatory Visit: Payer: Self-pay

## 2021-05-04 ENCOUNTER — Encounter: Payer: Self-pay | Admitting: Family Medicine

## 2021-05-04 VITALS — BP 154/91 | HR 82 | Resp 16 | Ht 62.0 in | Wt 145.0 lb

## 2021-05-04 DIAGNOSIS — R918 Other nonspecific abnormal finding of lung field: Secondary | ICD-10-CM

## 2021-05-04 DIAGNOSIS — R911 Solitary pulmonary nodule: Secondary | ICD-10-CM | POA: Diagnosis not present

## 2021-05-04 DIAGNOSIS — Z1231 Encounter for screening mammogram for malignant neoplasm of breast: Secondary | ICD-10-CM | POA: Diagnosis not present

## 2021-05-04 DIAGNOSIS — Z801 Family history of malignant neoplasm of trachea, bronchus and lung: Secondary | ICD-10-CM

## 2021-05-04 DIAGNOSIS — S0083XA Contusion of other part of head, initial encounter: Secondary | ICD-10-CM

## 2021-05-04 NOTE — Progress Notes (Signed)
Please call patient. Normal mammogram.  Repeat in 1 year.  

## 2021-05-04 NOTE — Progress Notes (Signed)
Established Patient Office Visit  Subjective:  Patient ID: Bianca Mckee, female    DOB: 1950/07/10  Age: 71 y.o. MRN: [id number]  CC:  Chief Complaint  Patient presents with   Discuss Lung Nodules     Found on xray.     HPI Bianca Mckee presents for   Follow-up from recent ED visit after a fall.  She said she was walking in the parking lot at work and the next thing she remembered was she had x-rays performed including a rib film with chest view that showed subtle patchy peripheral opacities in the upper lungs.  Possible pulmonary nodules and they recommended chest CT for further assessment.  Brother with hx Lung Ca but he was a smoker. She is not a smoker.      XR RIBS BILATERAL W PA CHEST MIN 4 VIEWS  Narrative:  TECHNIQUE: XR RIBS BILATERAL W PA CHEST MIN 4 VIEWS  INDICATIONS: injury chest wall COMPARISON: None.  FINDINGS: # Telemetry wires. # Subtle patchy peripheral opacities in the upper lungs. # No discernible pneumothorax. # No pleural effusion. # Size of cardiomediastinal silhouette is within normal limits. # Atherosclerosis. # Postsurgical changes to the shoulders/acromioclavicular joints. # No acute fracture identified. Specifically, no displaced rib fractures identified. # Nonspecific breast calcifications. # A marker is present along the lateral/lower chest.  Impression:  IMPRESSION: 1. No acute bony abnormality. Specifically, no displaced rib fractures identified. 2. Peripheral opacities in the lungs are nonspecific. Findings may relate to infection or pulmonary nodules. Consider follow-up chest CT for further assessment, as clinically indicated.  Electronically Signed by: Salvadore Oxford on 05/01/2021 9:37 AM   Past Medical History:  Diagnosis Date   Anxiety    Diabetes mellitus without complication ([place])    GERD (gastroesophageal reflux disease)    Hypertension 1996   Vaginal Pap smear, abnormal     Past Surgical History:  Procedure  Laterality Date   ARTHROPLASTY Left    left knee   CERVIX LESION DESTRUCTION     TUBAL LIGATION      Family History  Problem Relation Age of Onset   Lung cancer Brother 81       smoker   Pancreatic cancer Brother    Pancreatic cancer Brother    Lung cancer Brother     Social History   Socioeconomic History   Marital status: Divorced    Spouse name: Not on file   Number of children: 1   Years of education: 12   Highest education level: 12th grade  Occupational History   Occupation: Full time     Comment: (currently out on disability due to surgery)  Tobacco Use   Smoking status: Never   Smokeless tobacco: Never  Vaping Use   Vaping Use: Never used  Substance and Sexual Activity   Alcohol use: Yes    Alcohol/week: 1.0 standard drink    Types: 1 Shots of liquor per week    Comment: occasionally   Drug use: No   Sexual activity: Yes    Birth control/protection: None  Other Topics Concern   Not on file  Social History Narrative   Still working at [employer] in [place]. Lives alone. She recently had knee surgery. She enjoys watching t.v.    Social Determinants of Health   Financial Resource Strain: Low Risk    Difficulty of Paying Living Expenses: Not hard at all  Food Insecurity: No Food Insecurity   Worried About Running Out  of Food in the Last Year: Never true   [place] in the Last Year: Never true  Transportation Needs: No Transportation Needs   Lack of Transportation (Medical): No   Lack of Transportation (Non-Medical): No  Physical Activity: Inactive   Days of Exercise per Week: 0 days   Minutes of Exercise per Session: 0 min  Stress: No Stress Concern Present   Feeling of Stress : Not at all  Social Connections: Socially Isolated   Frequency of Communication with Friends and Family: More than three times a week   Frequency of Social Gatherings with Friends and Family: Three times a week   Attends Religious Services: Never   Active Member  of Clubs or Organizations: No   Attends Archivist Meetings: Never   Marital Status: Divorced  Human resources officer Violence: Not At Risk   Fear of Current or Ex-Partner: No   Emotionally Abused: No   Physically Abused: No   Sexually Abused: No    Outpatient Medications Prior to Visit  Medication Sig Dispense Refill   Accu-Chek Softclix Lancets lancets      ALPRAZolam (XANAX) 1 MG tablet TAKE 1/2-1 TABLET BY MOUTH DAILY AS NEEDED FOR ANXIETY 30 tablet 1   atorvastatin (LIPITOR) 40 MG tablet TAKE 1 TABLET BY MOUTH EVERYDAY AT BEDTIME 90 tablet 3   blood glucose meter kit and supplies Dispense based on patient and insurance preference. Use to test blood sugar once daily. Dx: E11.9 1 each 0   diclofenac Sodium (VOLTAREN) 1 % GEL APPLY 4 G TOPICALLY 4 (FOUR) TIMES DAILY. 400 g 1   doxepin (SINEQUAN) 10 MG capsule TAKE 1 CAPSULE BY MOUTH EVERYDAY AT BEDTIME 90 capsule 3   glucose blood (ACCU-CHEK GUIDE) test strip USE DAILY 100 strip 11   hydrochlorothiazide (HYDRODIURIL) 25 MG tablet TAKE 1 TABLET BY MOUTH EVERY DAY 90 tablet 0   Melatonin 10 MG TABS Take 10 mg by mouth at bedtime as needed.     metFORMIN (GLUCOPHAGE) 1000 MG tablet Take 1 tablet (1,000 mg total) by mouth 2 (two) times daily with a meal. (Patient taking differently: Take 500 mg by mouth daily with breakfast.) 180 tablet 1   predniSONE (DELTASONE) 20 MG tablet Take 2 tablets (40 mg total) by mouth daily with breakfast. 10 tablet 0   sertraline (ZOLOFT) 100 MG tablet TAKE 1.5 TABLETS BY MOUTH EVERY DAY 135 tablet 0   Suvorexant (BELSOMRA) 5 MG TABS Take 5 mg by mouth at bedtime as needed. 30 tablet 0   telmisartan (MICARDIS) 20 MG tablet TAKE 1 TABLET BY MOUTH EVERY DAY 90 tablet 0   HYDROcodone bit-homatropine (HYCODAN) 5-1.5 MG/5ML syrup Take 5-10 mLs by mouth at bedtime as needed for cough. 60 mL 0   No facility-administered medications prior to visit.    Allergies  Allergen Reactions   Ambien [Zolpidem Tartrate]  Other (See Comments)    Sleep walking, injury   Codeine Phosphate    Eszopiclone Other (See Comments)    Metallic taste    ROS Review of Systems    Objective:    Physical Exam Constitutional:      Appearance: Normal appearance. She is well-developed.  HENT:     Head: Normocephalic and atraumatic.  Cardiovascular:     Rate and Rhythm: Normal rate and regular rhythm.     Heart sounds: Normal heart sounds.  Pulmonary:     Effort: Pulmonary effort is normal.     Breath sounds: Normal breath sounds.  Skin:    General: Skin is warm and dry.  Neurological:     Mental Status: She is alert and oriented to person, place, and time.  Psychiatric:        Behavior: Behavior normal.    BP (!) 154/91    Pulse 82    Resp 16    Ht 5' 2"  (1.575 m)    Wt 145 lb (65.8 kg)    SpO2 98%    BMI 26.52 kg/m  Wt Readings from Last 3 Encounters:  05/04/21 145 lb (65.8 kg)  04/10/21 143 lb (64.9 kg)  01/05/21 145 lb (65.8 kg)     There are no preventive care reminders to display for this patient.  There are no preventive care reminders to display for this patient.  Lab Results  Component Value Date   TSH 1.754 04/17/2013   Lab Results  Component Value Date   WBC 7.4 08/17/2020   HGB 12.8 08/17/2020   HCT 39.4 08/17/2020   MCV 94.0 08/17/2020   PLT 270 08/17/2020   Lab Results  Component Value Date   NA 141 08/17/2020   K 4.1 08/17/2020   CO2 27 08/17/2020   GLUCOSE 127 (H) 08/17/2020   BUN 21 08/17/2020   CREATININE 1.01 (H) 08/17/2020   BILITOT 0.4 08/17/2020   ALKPHOS 71 01/05/2016   AST 15 08/17/2020   ALT 11 08/17/2020   PROT 6.9 08/17/2020   ALBUMIN 4.4 01/05/2016   CALCIUM 9.8 08/17/2020   Lab Results  Component Value Date   CHOL 205 (H) 08/17/2020   Lab Results  Component Value Date   HDL 53 08/17/2020   Lab Results  Component Value Date   LDLCALC 128 (H) 08/17/2020   Lab Results  Component Value Date   TRIG 126 08/17/2020   Lab Results  Component  Value Date   CHOLHDL 3.9 08/17/2020   Lab Results  Component Value Date   HGBA1C 6.0 (A) 01/05/2021      Assessment & Plan:   Problem List Items Addressed This Visit   None Visit Diagnoses     Abnormal findings on diagnostic imaging of lung    -  Primary   Relevant Orders   CT Chest Wo Contrast   Pulmonary nodule       Relevant Orders   CT Chest Wo Contrast   Contusion of forehead, initial encounter       Family history of lung cancer           Contusion of forehead from fall at work-she says it is getting a lot better.  She still has some swelling over the right eyebrow ridge.  Abnormal x-ray-discussed getting CT for further work-up and evaluation she does have a family history of lung cancer in her brother.  We will get that set up with imaging downstairs but will likely need prior Auth with her insurance.  Recent fall-unclear etiology.  She does not remember feeling bad or tripping or falling she just remembers hitting the concrete and then waking up.  It is a little bit unusual but she does report that there are a lot of cracks in the concrete.  If she has another episode or syncopal episode then please let us know.  No orders of the defined types were placed in this encounter.   Follow-up: No follow-ups on file.    Beatrice Lecher, MD

## 2021-05-05 DIAGNOSIS — R293 Abnormal posture: Secondary | ICD-10-CM | POA: Diagnosis not present

## 2021-05-05 DIAGNOSIS — M25562 Pain in left knee: Secondary | ICD-10-CM | POA: Diagnosis not present

## 2021-05-05 DIAGNOSIS — M6281 Muscle weakness (generalized): Secondary | ICD-10-CM | POA: Diagnosis not present

## 2021-05-05 DIAGNOSIS — R262 Difficulty in walking, not elsewhere classified: Secondary | ICD-10-CM | POA: Diagnosis not present

## 2021-05-08 ENCOUNTER — Ambulatory Visit: Payer: Medicare HMO | Admitting: Family Medicine

## 2021-05-08 DIAGNOSIS — N893 Dysplasia of vagina, unspecified: Secondary | ICD-10-CM | POA: Diagnosis not present

## 2021-05-09 DIAGNOSIS — M6281 Muscle weakness (generalized): Secondary | ICD-10-CM | POA: Diagnosis not present

## 2021-05-09 DIAGNOSIS — M25562 Pain in left knee: Secondary | ICD-10-CM | POA: Diagnosis not present

## 2021-05-09 DIAGNOSIS — R293 Abnormal posture: Secondary | ICD-10-CM | POA: Diagnosis not present

## 2021-05-09 DIAGNOSIS — R262 Difficulty in walking, not elsewhere classified: Secondary | ICD-10-CM | POA: Diagnosis not present

## 2021-05-11 DIAGNOSIS — R262 Difficulty in walking, not elsewhere classified: Secondary | ICD-10-CM | POA: Diagnosis not present

## 2021-05-11 DIAGNOSIS — R293 Abnormal posture: Secondary | ICD-10-CM | POA: Diagnosis not present

## 2021-05-11 DIAGNOSIS — M25562 Pain in left knee: Secondary | ICD-10-CM | POA: Diagnosis not present

## 2021-05-11 DIAGNOSIS — M6281 Muscle weakness (generalized): Secondary | ICD-10-CM | POA: Diagnosis not present

## 2021-05-15 DIAGNOSIS — R262 Difficulty in walking, not elsewhere classified: Secondary | ICD-10-CM | POA: Diagnosis not present

## 2021-05-15 DIAGNOSIS — R293 Abnormal posture: Secondary | ICD-10-CM | POA: Diagnosis not present

## 2021-05-15 DIAGNOSIS — M25562 Pain in left knee: Secondary | ICD-10-CM | POA: Diagnosis not present

## 2021-05-15 DIAGNOSIS — M6281 Muscle weakness (generalized): Secondary | ICD-10-CM | POA: Diagnosis not present

## 2021-05-17 DIAGNOSIS — M25562 Pain in left knee: Secondary | ICD-10-CM | POA: Diagnosis not present

## 2021-05-17 DIAGNOSIS — R293 Abnormal posture: Secondary | ICD-10-CM | POA: Diagnosis not present

## 2021-05-17 DIAGNOSIS — R262 Difficulty in walking, not elsewhere classified: Secondary | ICD-10-CM | POA: Diagnosis not present

## 2021-05-17 DIAGNOSIS — M6281 Muscle weakness (generalized): Secondary | ICD-10-CM | POA: Diagnosis not present

## 2021-05-18 ENCOUNTER — Encounter: Payer: Self-pay | Admitting: Family Medicine

## 2021-05-18 ENCOUNTER — Other Ambulatory Visit: Payer: Self-pay

## 2021-05-18 ENCOUNTER — Ambulatory Visit (INDEPENDENT_AMBULATORY_CARE_PROVIDER_SITE_OTHER): Payer: Medicare HMO | Admitting: Family Medicine

## 2021-05-18 VITALS — BP 132/71 | HR 79 | Resp 18 | Ht 62.0 in | Wt 147.0 lb

## 2021-05-18 DIAGNOSIS — I10 Essential (primary) hypertension: Secondary | ICD-10-CM

## 2021-05-18 DIAGNOSIS — M79646 Pain in unspecified finger(s): Secondary | ICD-10-CM | POA: Diagnosis not present

## 2021-05-18 DIAGNOSIS — E119 Type 2 diabetes mellitus without complications: Secondary | ICD-10-CM | POA: Diagnosis not present

## 2021-05-18 DIAGNOSIS — E1122 Type 2 diabetes mellitus with diabetic chronic kidney disease: Secondary | ICD-10-CM

## 2021-05-18 DIAGNOSIS — N1831 Chronic kidney disease, stage 3a: Secondary | ICD-10-CM | POA: Diagnosis not present

## 2021-05-18 LAB — POCT GLYCOSYLATED HEMOGLOBIN (HGB A1C): Hemoglobin A1C: 6.3 % — AB (ref 4.0–5.6)

## 2021-05-18 MED ORDER — METFORMIN HCL 1000 MG PO TABS: 500.0000 mg | ORAL_TABLET | Freq: Every day | ORAL | 0 refills | Status: DC

## 2021-05-18 NOTE — Assessment & Plan Note (Signed)
1C looks good today at 6.3.  Continue current regimen.  Follow-up in 4 months. ?
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
## 2021-05-18 NOTE — Assessment & Plan Note (Signed)
Planning for surgery this summer. ?
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
## 2021-05-19 LAB — BASIC METABOLIC PANEL WITH GFR
BUN/Creatinine Ratio: 30 (calc) — ABNORMAL HIGH (ref 6–22)
BUN: 27 mg/dL — ABNORMAL HIGH (ref 7–25)
CO2: 26 mmol/L (ref 20–32)
Calcium: 9 mg/dL (ref 8.6–10.4)
Chloride: 104 mmol/L (ref 98–110)
Creat: 0.91 mg/dL (ref 0.50–1.05)
Glucose, Bld: 85 mg/dL (ref 65–99)
Potassium: 3.6 mmol/L (ref 3.5–5.3)
Sodium: 140 mmol/L (ref 135–146)
eGFR: 69 mL/min/{1.73_m2} (ref >=60)

## 2021-05-19 NOTE — Progress Notes (Signed)
Your lab work is within acceptable range and there are no concerning findings.   ?

## 2021-05-22 ENCOUNTER — Encounter: Payer: Self-pay | Admitting: Family Medicine

## 2021-05-22 DIAGNOSIS — M6281 Muscle weakness (generalized): Secondary | ICD-10-CM | POA: Diagnosis not present

## 2021-05-22 DIAGNOSIS — R293 Abnormal posture: Secondary | ICD-10-CM | POA: Diagnosis not present

## 2021-05-22 DIAGNOSIS — R262 Difficulty in walking, not elsewhere classified: Secondary | ICD-10-CM | POA: Diagnosis not present

## 2021-05-22 DIAGNOSIS — M25562 Pain in left knee: Secondary | ICD-10-CM | POA: Diagnosis not present

## 2021-05-23 ENCOUNTER — Other Ambulatory Visit: Payer: Self-pay

## 2021-05-23 ENCOUNTER — Ambulatory Visit (INDEPENDENT_AMBULATORY_CARE_PROVIDER_SITE_OTHER): Payer: Medicare HMO

## 2021-05-23 DIAGNOSIS — I700 Atherosclerosis of aorta: Secondary | ICD-10-CM | POA: Diagnosis not present

## 2021-05-23 DIAGNOSIS — Z77120 Contact with and (suspected) exposure to mold (toxic): Secondary | ICD-10-CM | POA: Insufficient documentation

## 2021-05-23 DIAGNOSIS — R918 Other nonspecific abnormal finding of lung field: Secondary | ICD-10-CM | POA: Diagnosis not present

## 2021-05-23 DIAGNOSIS — R911 Solitary pulmonary nodule: Secondary | ICD-10-CM

## 2021-05-24 ENCOUNTER — Encounter: Payer: Self-pay | Admitting: Family Medicine

## 2021-05-24 DIAGNOSIS — M25562 Pain in left knee: Secondary | ICD-10-CM | POA: Diagnosis not present

## 2021-05-24 DIAGNOSIS — M6281 Muscle weakness (generalized): Secondary | ICD-10-CM | POA: Diagnosis not present

## 2021-05-24 DIAGNOSIS — R262 Difficulty in walking, not elsewhere classified: Secondary | ICD-10-CM | POA: Diagnosis not present

## 2021-05-24 DIAGNOSIS — R918 Other nonspecific abnormal finding of lung field: Secondary | ICD-10-CM

## 2021-05-24 DIAGNOSIS — R293 Abnormal posture: Secondary | ICD-10-CM | POA: Diagnosis not present

## 2021-05-24 NOTE — Progress Notes (Signed)
Hi Bianca Mckee, they did note some patchy areas in your lungs that look like it could be a type of atypical or viral pneumonia.  It is a little bit unusual because we were really just following up on the previous chest x-ray.  Did not see any nodules which is good.  Are you having any increased cough or sputum production and or feeling more short of breath than usual?

## 2021-05-25 NOTE — Telephone Encounter (Signed)
No change in treatment plan for now.  But I would like to refer to pulmonary.  I like them to take a look at this a little closer. ?

## 2021-05-29 DIAGNOSIS — M6281 Muscle weakness (generalized): Secondary | ICD-10-CM | POA: Diagnosis not present

## 2021-05-29 DIAGNOSIS — R293 Abnormal posture: Secondary | ICD-10-CM | POA: Diagnosis not present

## 2021-05-29 DIAGNOSIS — M25562 Pain in left knee: Secondary | ICD-10-CM | POA: Diagnosis not present

## 2021-05-29 DIAGNOSIS — R262 Difficulty in walking, not elsewhere classified: Secondary | ICD-10-CM | POA: Diagnosis not present

## 2021-05-31 DIAGNOSIS — M1811 Unilateral primary osteoarthritis of first carpometacarpal joint, right hand: Secondary | ICD-10-CM | POA: Diagnosis not present

## 2021-06-01 DIAGNOSIS — M25562 Pain in left knee: Secondary | ICD-10-CM | POA: Diagnosis not present

## 2021-06-01 DIAGNOSIS — R293 Abnormal posture: Secondary | ICD-10-CM | POA: Diagnosis not present

## 2021-06-01 DIAGNOSIS — M6281 Muscle weakness (generalized): Secondary | ICD-10-CM | POA: Diagnosis not present

## 2021-06-01 DIAGNOSIS — R262 Difficulty in walking, not elsewhere classified: Secondary | ICD-10-CM | POA: Diagnosis not present

## 2021-06-05 DIAGNOSIS — M6281 Muscle weakness (generalized): Secondary | ICD-10-CM | POA: Diagnosis not present

## 2021-06-05 DIAGNOSIS — M25562 Pain in left knee: Secondary | ICD-10-CM | POA: Diagnosis not present

## 2021-06-05 DIAGNOSIS — R262 Difficulty in walking, not elsewhere classified: Secondary | ICD-10-CM | POA: Diagnosis not present

## 2021-06-05 DIAGNOSIS — R293 Abnormal posture: Secondary | ICD-10-CM | POA: Diagnosis not present

## 2021-06-06 DIAGNOSIS — Z96652 Presence of left artificial knee joint: Secondary | ICD-10-CM | POA: Diagnosis not present

## 2021-06-06 DIAGNOSIS — M1811 Unilateral primary osteoarthritis of first carpometacarpal joint, right hand: Secondary | ICD-10-CM | POA: Diagnosis not present

## 2021-06-08 ENCOUNTER — Other Ambulatory Visit: Payer: Self-pay | Admitting: Family Medicine

## 2021-06-08 DIAGNOSIS — G4700 Insomnia, unspecified: Secondary | ICD-10-CM

## 2021-06-12 DIAGNOSIS — R262 Difficulty in walking, not elsewhere classified: Secondary | ICD-10-CM | POA: Diagnosis not present

## 2021-06-12 DIAGNOSIS — R293 Abnormal posture: Secondary | ICD-10-CM | POA: Diagnosis not present

## 2021-06-12 DIAGNOSIS — M6281 Muscle weakness (generalized): Secondary | ICD-10-CM | POA: Diagnosis not present

## 2021-06-12 DIAGNOSIS — M25562 Pain in left knee: Secondary | ICD-10-CM | POA: Diagnosis not present

## 2021-06-13 DIAGNOSIS — R059 Cough, unspecified: Secondary | ICD-10-CM | POA: Diagnosis not present

## 2021-06-13 DIAGNOSIS — R9389 Abnormal findings on diagnostic imaging of other specified body structures: Secondary | ICD-10-CM | POA: Diagnosis not present

## 2021-06-13 DIAGNOSIS — Z8616 Personal history of COVID-19: Secondary | ICD-10-CM | POA: Diagnosis not present

## 2021-06-13 LAB — HM DIABETES EYE EXAM

## 2021-06-15 DIAGNOSIS — R293 Abnormal posture: Secondary | ICD-10-CM | POA: Diagnosis not present

## 2021-06-15 DIAGNOSIS — M25562 Pain in left knee: Secondary | ICD-10-CM | POA: Diagnosis not present

## 2021-06-15 DIAGNOSIS — R262 Difficulty in walking, not elsewhere classified: Secondary | ICD-10-CM | POA: Diagnosis not present

## 2021-06-15 DIAGNOSIS — M6281 Muscle weakness (generalized): Secondary | ICD-10-CM | POA: Diagnosis not present

## 2021-06-16 ENCOUNTER — Encounter: Payer: Self-pay | Admitting: Family Medicine

## 2021-06-16 NOTE — Progress Notes (Signed)
06/13/21.

## 2021-06-20 DIAGNOSIS — M6281 Muscle weakness (generalized): Secondary | ICD-10-CM | POA: Diagnosis not present

## 2021-06-20 DIAGNOSIS — R262 Difficulty in walking, not elsewhere classified: Secondary | ICD-10-CM | POA: Diagnosis not present

## 2021-06-20 DIAGNOSIS — M25562 Pain in left knee: Secondary | ICD-10-CM | POA: Diagnosis not present

## 2021-06-20 DIAGNOSIS — R293 Abnormal posture: Secondary | ICD-10-CM | POA: Diagnosis not present

## 2021-06-22 DIAGNOSIS — R262 Difficulty in walking, not elsewhere classified: Secondary | ICD-10-CM | POA: Diagnosis not present

## 2021-06-22 DIAGNOSIS — M6281 Muscle weakness (generalized): Secondary | ICD-10-CM | POA: Diagnosis not present

## 2021-06-22 DIAGNOSIS — M25562 Pain in left knee: Secondary | ICD-10-CM | POA: Diagnosis not present

## 2021-06-22 DIAGNOSIS — R293 Abnormal posture: Secondary | ICD-10-CM | POA: Diagnosis not present

## 2021-06-27 DIAGNOSIS — R262 Difficulty in walking, not elsewhere classified: Secondary | ICD-10-CM | POA: Diagnosis not present

## 2021-06-27 DIAGNOSIS — M25562 Pain in left knee: Secondary | ICD-10-CM | POA: Diagnosis not present

## 2021-06-27 DIAGNOSIS — M6281 Muscle weakness (generalized): Secondary | ICD-10-CM | POA: Diagnosis not present

## 2021-06-27 DIAGNOSIS — R293 Abnormal posture: Secondary | ICD-10-CM | POA: Diagnosis not present

## 2021-06-29 DIAGNOSIS — R262 Difficulty in walking, not elsewhere classified: Secondary | ICD-10-CM | POA: Diagnosis not present

## 2021-06-29 DIAGNOSIS — M6281 Muscle weakness (generalized): Secondary | ICD-10-CM | POA: Diagnosis not present

## 2021-06-29 DIAGNOSIS — M25562 Pain in left knee: Secondary | ICD-10-CM | POA: Diagnosis not present

## 2021-06-29 DIAGNOSIS — R293 Abnormal posture: Secondary | ICD-10-CM | POA: Diagnosis not present

## 2021-07-04 DIAGNOSIS — R262 Difficulty in walking, not elsewhere classified: Secondary | ICD-10-CM | POA: Diagnosis not present

## 2021-07-04 DIAGNOSIS — R293 Abnormal posture: Secondary | ICD-10-CM | POA: Diagnosis not present

## 2021-07-04 DIAGNOSIS — M25562 Pain in left knee: Secondary | ICD-10-CM | POA: Diagnosis not present

## 2021-07-04 DIAGNOSIS — M6281 Muscle weakness (generalized): Secondary | ICD-10-CM | POA: Diagnosis not present

## 2021-07-11 DIAGNOSIS — R262 Difficulty in walking, not elsewhere classified: Secondary | ICD-10-CM | POA: Diagnosis not present

## 2021-07-11 DIAGNOSIS — M6281 Muscle weakness (generalized): Secondary | ICD-10-CM | POA: Diagnosis not present

## 2021-07-11 DIAGNOSIS — M25562 Pain in left knee: Secondary | ICD-10-CM | POA: Diagnosis not present

## 2021-07-11 DIAGNOSIS — R293 Abnormal posture: Secondary | ICD-10-CM | POA: Diagnosis not present

## 2021-07-12 DIAGNOSIS — R293 Abnormal posture: Secondary | ICD-10-CM | POA: Diagnosis not present

## 2021-07-12 DIAGNOSIS — M25562 Pain in left knee: Secondary | ICD-10-CM | POA: Diagnosis not present

## 2021-07-12 DIAGNOSIS — M6281 Muscle weakness (generalized): Secondary | ICD-10-CM | POA: Diagnosis not present

## 2021-07-12 DIAGNOSIS — R262 Difficulty in walking, not elsewhere classified: Secondary | ICD-10-CM | POA: Diagnosis not present

## 2021-07-14 ENCOUNTER — Other Ambulatory Visit: Payer: Self-pay | Admitting: Family Medicine

## 2021-07-18 DIAGNOSIS — R262 Difficulty in walking, not elsewhere classified: Secondary | ICD-10-CM | POA: Diagnosis not present

## 2021-07-18 DIAGNOSIS — M6281 Muscle weakness (generalized): Secondary | ICD-10-CM | POA: Diagnosis not present

## 2021-07-18 DIAGNOSIS — M25562 Pain in left knee: Secondary | ICD-10-CM | POA: Diagnosis not present

## 2021-07-18 DIAGNOSIS — R293 Abnormal posture: Secondary | ICD-10-CM | POA: Diagnosis not present

## 2021-07-19 ENCOUNTER — Ambulatory Visit (INDEPENDENT_AMBULATORY_CARE_PROVIDER_SITE_OTHER): Payer: Medicare HMO | Admitting: Family Medicine

## 2021-07-19 DIAGNOSIS — Z0000 Encounter for general adult medical examination without abnormal findings: Secondary | ICD-10-CM | POA: Diagnosis not present

## 2021-07-19 NOTE — Patient Instructions (Addendum)
?MEDICARE ANNUAL WELLNESS VISIT ?Health Maintenance Summary and Written Plan of Care ? ?Bianca Mckee , ? ?Thank you for allowing me to perform your Medicare Annual Wellness Visit and for your ongoing commitment to your health.  ? ?Health Maintenance & Immunization History ?Health Maintenance  ?Topic Date Due  ? Zoster Vaccines- Shingrix (2 of 2) 10/19/2021 (Originally 02/05/2019)  ? COVID-19 Vaccine (4 - Booster for [place] series) 01/21/2022 (Originally 02/24/2020)  ? COLONOSCOPY (Pts 45-63yr Insurance coverage will need to be confirmed)  01/06/2023 (Originally 12/19/2020)  ? INFLUENZA VACCINE  10/17/2021  ? HEMOGLOBIN A1C  11/18/2021  ? FOOT EXAM  05/19/2022  ? OPHTHALMOLOGY EXAM  06/14/2022  ? DEXA SCAN  02/16/2023  ? MAMMOGRAM  05/05/2023  ? TETANUS/TDAP  05/02/2031  ? Pneumonia Vaccine 69 Years old  Completed  ? Hepatitis C Screening  Completed  ? HPV VACCINES  Aged Out  ? ?Immunization History  ?Administered Date(s) Administered  ? Fluad Quad(high Dose 65+) 02/27/2019  ? Influenza-Unspecified 02/27/2019  ? PFIZER(Purple Top)SARS-COV-2 Vaccination 05/28/2019, 06/18/2019, 12/30/2019  ? Pneumococcal Conjugate-13 03/09/2014  ? Pneumococcal Polysaccharide-23 01/06/2015, 04/12/2020  ? Tdap 06/14/2015, 05/01/2021  ? Zoster Recombinat (Shingrix) 12/11/2018  ? Zoster, Live 01/06/2015  ? ? ?These are the patient goals that we discussed: ? Goals Addressed   ? ?  ?  ?  ?  ?  ? This Visit's Progress  ?   Patient Stated (pt-stated)     ?   Would like to be able to get her legs stronger. ?  ? ?  ?  ? ?This is a list of Health Maintenance Items that are overdue or due now: ?Colorectal cancer screening ?Shingrix 2nd dose ?  ? ?Orders/Referrals Placed Today: ?No orders of the defined types were placed in this encounter. ? ?(Contact our referral department at [phone] you have not spoken with someone about your referral appointment within the next 5 days)  ? ? ?Follow-up Plan ?Follow-up with MHali Marry MD as  planned ?Schedule your Shingrix 2nd dose at your pharmacy.  ?Please let u[place]now when you decide to have your colonoscopy. ?Medicare wellness visit in one year. ?Patient will access AVS on my chart. ? ? ?  ?Health Maintenance, Female ?Adopting a healthy lifestyle and getting preventive care are important in promoting health and wellness. Ask your health care provider about: ?The right schedule for you to have regular tests and exams. ?Things you can do on your own to prevent diseases and keep yourself healthy. ?What should I know about diet, weight, and exercise? ?Eat a healthy diet ? ?Eat a diet that includes plenty of vegetables, fruits, low-fat dairy products, and lean protein. ?Do not eat a lot of foods that are high in solid fats, added sugars, or sodium. ?Maintain a healthy weight ?Body mass index (BMI) is used to identify weight problems. It estimates body fat based on height and weight. Your health care provider can help determine your BMI and help you achieve or maintain a healthy weight. ?Get regular exercise ?Get regular exercise. This is one of the most important things you can do for your health. Most adults should: ?Exercise for at least 150 minutes each week. The exercise should increase your heart rate and make you sweat (moderate-intensity exercise). ?Do strengthening exercises at least twice a week. This is in addition to the moderate-intensity exercise. ?Spend less time sitting. Even light physical activity can be beneficial. ?Watch cholesterol and blood lipids ?Have your blood tested for lipids and  cholesterol at 69 years of age, then have this test every 5 years. ?Have your cholesterol levels checked more often if: ?Your lipid or cholesterol levels are high. ?You are older than 70 years of age. ?You are at high risk for heart disease. ?What should I know about cancer screening? ?Depending on your health history and family history, you may need to have cancer screening at various ages. This may  include screening for: ?Breast cancer. ?Cervical cancer. ?Colorectal cancer. ?Skin cancer. ?Lung cancer. ?What should I know about heart disease, diabetes, and high blood pressure? ?Blood pressure and heart disease ?High blood pressure causes heart disease and increases the risk of stroke. This is more likely to develop in people who have high blood pressure readings or are overweight. ?Have your blood pressure checked: ?Every 3-5 years if you are 76-54 years of age. ?Every year if you are 6 years old or older. ?Diabetes ?Have regular diabetes screenings. This checks your fasting blood sugar level. Have the screening done: ?Once every three years after age 10 if you are at a normal weight and have a low risk for diabetes. ?More often and at a younger age if you are overweight or have a high risk for diabetes. ?What should I know about preventing infection? ?Hepatitis B ?If you have a higher risk for hepatitis B, you should be screened for this virus. Talk with your health care provider to find out if you are at risk for hepatitis B infection. ?Hepatitis C ?Testing is recommended for: ?Everyone born from [birth year] through 1963. ?Anyone with known risk factors for hepatitis C. ?Sexually transmitted infections (STIs) ?Get screened for STIs, including gonorrhea and chlamydia, if: ?You are sexually active and are younger than 69 years of age. ?You are older than 69 years of age and your health care provider tells you that you are at risk for this type of infection. ?Your sexual activity has changed since you were last screened, and you are at increased risk for chlamydia or gonorrhea. Ask your health care provider if you are at risk. ?Ask your health care provider about whether you are at high risk for HIV. Your health care provider may recommend a prescription medicine to help prevent HIV infection. If you choose to take medicine to prevent HIV, you should first get tested for HIV. You should then be tested every 3 months  for as long as you are taking the medicine. ?Pregnancy ?If you are about to stop having your period (premenopausal) and you may become pregnant, seek counseling before you get pregnant. ?Take 400 to 800 micrograms (mcg) of folic acid every day if you become pregnant. ?Ask for birth control (contraception) if you want to prevent pregnancy. ?Osteoporosis and menopause ?Osteoporosis is a disease in which the bones lose minerals and strength with aging. This can result in bone fractures. If you are 70 years old or older, or if you are at risk for osteoporosis and fractures, ask your health care provider if you should: ?Be screened for bone loss. ?Take a calcium or vitamin D supplement to lower your risk of fractures. ?Be given hormone replacement therapy (HRT) to treat symptoms of menopause. ?Follow these instructions at home: ?Alcohol use ?Do not drink alcohol if: ?Your health care provider tells you not to drink. ?You are pregnant, may be pregnant, or are planning to become pregnant. ?If you drink alcohol: ?Limit how much you have to: ?0-1 drink a day. ?Know how much alcohol is in your drink. In the [place]  one drink equals one 12 oz bottle of beer (355 mL), one 5 oz glass of wine (148 mL), or one 1? oz glass of hard liquor (44 mL). ?Lifestyle ?Do not use any products that contain nicotine or tobacco. These products include cigarettes, chewing tobacco, and vaping devices, such as e-cigarettes. If you need help quitting, ask your health care provider. ?Do not use street drugs. ?Do not share needles. ?Ask your health care provider for help if you need support or information about quitting drugs. ?General instructions ?Schedule regular health, dental, and eye exams. ?Stay current with your vaccines. ?Tell your health care provider if: ?You often feel depressed. ?You have ever been abused or do not feel safe at home. ?Summary ?Adopting a healthy lifestyle and getting preventive care are important in promoting health and  wellness. ?Follow your health care provider's instructions about healthy diet, exercising, and getting tested or screened for diseases. ?Follow your health care provider's instructions on monitoring your chol

## 2021-07-19 NOTE — Progress Notes (Signed)
? ? ?MEDICARE ANNUAL WELLNESS VISIT ? ?07/19/2021 ? ?Telephone Visit Disclaimer ?This Medicare AWV was conducted by telephone due to national recommendations for restrictions regarding the COVID-19 Pandemic (e.g. social distancing).  I verified, using two identifiers, that I am speaking with Bianca Mckee or their authorized healthcare agent. I discussed the limitations, risks, security, and privacy concerns of performing an evaluation and management service by telephone and the potential availability of an in-person appointment in the future. The patient expressed understanding and agreed to proceed.  ?Location of Patient: Home ?Location of Provider (nurse):  In the office. ? ?Subjective:  ? ? ?Bianca Mckee is a 71 y.o. female patient of Metheney, Rene Kocher, MD who had a Medicare Annual Wellness Visit today via telephone. Bettye is Working full time and lives alone. she has 1 child. she reports that she is socially active and does interact with friends/family regularly. she is minimally physically active and enjoys watching television. ? ?Patient Care Team: ?Hali Marry, MD as PCP - General ?Clent Jacks, MD as Referring Physician (Nephrology) ?Dr. Louretta Shorten  (Gynecology) ? ? ?  07/19/2021  ? 10:46 AM 07/08/2020  ?  2:14 PM 06/07/2020  ?  1:27 PM 12/02/2018  ?  3:08 PM  ?Advanced Directives  ?Does Patient Have a Medical Advance Directive? No No No No  ?Would patient like information on creating a medical advance directive? No - Patient declined No - Patient declined No - Patient declined No - Patient declined  ? ? ?Hospital Utilization Over the Past 12 Months: ?# of hospitalizations or ER visits: 1 ?# of surgeries: 1 ? ?Review of Systems    ?Patient reports that her overall health is better compared to last year. ? ?History obtained from chart review and the patient ? ?Patient Reported Readings (BP, Pulse, CBG, Weight, etc) ?none ? ?Pain Assessment ?Pain : No/denies pain ? ?  ? ?Current Medications  & Allergies (verified) ?Allergies as of 07/19/2021   ? ?   Reactions  ? Ambien [zolpidem Tartrate] Other (See Comments)  ? Sleep walking, injury  ? Codeine Phosphate   ? Eszopiclone Other (See Comments)  ? Metallic taste  ? ?  ? ?  ?Medication List  ?  ? ?  ? Accurate as of Jul 19, 2021 11:05 AM. If you have any questions, ask your nurse or doctor.  ?  ?  ? ?  ? ?Accu-Chek Guide test strip ?Generic drug: glucose blood ?USE DAILY ?  ?Accu-Chek Softclix Lancets lancets ?  ?ALPRAZolam 1 MG tablet ?Commonly known as: Duanne Moron ?TAKE 1/2-1 TABLET BY MOUTH DAILY AS NEEDED FOR ANXIETY ?  ?atorvastatin 40 MG tablet ?Commonly known as: LIPITOR ?TAKE 1 TABLET BY MOUTH EVERYDAY AT BEDTIME ?  ?Belsomra 5 MG Tabs ?Generic drug: Suvorexant ?Take 5 mg by mouth at bedtime as needed. ?  ?blood glucose meter kit and supplies ?Dispense based on patient and insurance preference. Use to test blood sugar once daily. Dx: E11.9 ?  ?diclofenac 75 MG EC tablet ?Commonly known as: VOLTAREN ?SMARTSIG:1 Tablet(s) By Mouth Morning-Night PRN ?  ?diclofenac Sodium 1 % Gel ?Commonly known as: VOLTAREN ?APPLY 4 G TOPICALLY 4 (FOUR) TIMES DAILY. ?  ?doxepin 10 MG capsule ?Commonly known as: SINEQUAN ?TAKE 1 CAPSULE BY MOUTH EVERYDAY AT BEDTIME ?  ?hydrochlorothiazide 25 MG tablet ?Commonly known as: HYDRODIURIL ?TAKE 1 TABLET BY MOUTH EVERY DAY ?  ?Melatonin 10 MG Tabs ?Take 10 mg by mouth at bedtime as needed. ?  ?  metFORMIN 1000 MG tablet ?Commonly known as: GLUCOPHAGE ?TAKE 1 TABLET (1,000 MG TOTAL) BY MOUTH 2 (TWO) TIMES DAILY WITH A MEAL. ?  ?sertraline 100 MG tablet ?Commonly known as: ZOLOFT ?TAKE 1.5 TABLETS BY MOUTH EVERY DAY ?  ?telmisartan 20 MG tablet ?Commonly known as: MICARDIS ?TAKE 1 TABLET BY MOUTH EVERY DAY ?  ? ?  ? ? ?History (reviewed): ?Past Medical History:  ?Diagnosis Date  ? Anxiety   ? Arthritis 2016  ? Depression 1996  ? Diabetes mellitus without complication ([place])   ? GERD (gastroesophageal reflux disease)   ? Hypertension 1996  ?  Vaginal Pap smear, abnormal   ? ?Past Surgical History:  ?Procedure Laterality Date  ? ABDOMINAL HYSTERECTOMY  2020  ? ARTHROPLASTY Left 2022  ? left knee  ? CERVIX LESION DESTRUCTION    ? JOINT REPLACEMENT  2017&2022  ? TUBAL LIGATION    ? ?Family History  ?Problem Relation Age of Onset  ? Lung cancer Brother 58  ?     smoker  ? Pancreatic cancer Brother   ? Pancreatic cancer Brother   ? Lung cancer Brother   ? Diabetes Mother   ? Arthritis Father   ? Hypertension Father   ? ADD / ADHD Son   ? ?Social History  ? ?Socioeconomic History  ? Marital status: Divorced  ?  Spouse name: Not on file  ? Number of children: 1  ? Years of education: 41  ? Highest education level: 12th grade  ?Occupational History  ? Occupation: Full time   ?Tobacco Use  ? Smoking status: Never  ? Smokeless tobacco: Never  ?Vaping Use  ? Vaping Use: Never used  ?Substance and Sexual Activity  ? Alcohol use: Yes  ?  Alcohol/week: 1.0 standard drink  ?  Types: 1 Shots of liquor per week  ?  Comment: occasionally  ? Drug use: No  ? Sexual activity: Yes  ?  Birth control/protection: Post-menopausal, None  ?Other Topics Concern  ? Not on file  ?Social History Narrative  ? Still working at [employer] in [place]. Lives alone. She enjoys watching t.v.   ? ?Social Determinants of Health  ? ?Financial Resource Strain: Low Risk   ? Difficulty of Paying Living Expenses: Not very hard  ?Food Insecurity: Food Insecurity Present  ? Worried About Charity fundraiser in the Last Year: Sometimes true  ? Ran Out of Food in the Last Year: Sometimes true  ?Transportation Needs: No Transportation Needs  ? Lack of Transportation (Medical): No  ? Lack of Transportation (Non-Medical): No  ?Physical Activity: Sufficiently Active  ? Days of Exercise per Week: 3 days  ? Minutes of Exercise per Session: 60 min  ?Stress: Stress Concern Present  ? Feeling of Stress : Very much  ?Social Connections: Socially Isolated  ? Frequency of Communication with Friends and Family:  Once a week  ? Frequency of Social Gatherings with Friends and Family: Never  ? Attends Religious Services: Never  ? Active Member of Clubs or Organizations: No  ? Attends Archivist Meetings: Never  ? Marital Status: Divorced  ? ? ?Activities of Daily Living ? ?  07/18/2021  ?  6:38 PM  ?In your present state of health, do you have any difficulty performing the following activities:  ?Hearing? 0  ?Vision? 0  ?Difficulty concentrating or making decisions? 1  ?Walking or climbing stairs? 1  ?Dressing or bathing? 0  ?Doing errands, shopping? 0  ?Preparing Food  and eating ? N  ?Using the Toilet? N  ?In the past six months, have you accidently leaked urine? N  ?Do you have problems with loss of bowel control? N  ?Managing your Medications? N  ?Managing your Finances? N  ?Housekeeping or managing your Housekeeping? N  ? ? ?Patient Education/ Literacy ?How often do you need to have someone help you when you read instructions, pamphlets, or other written materials from your doctor or pharmacy?: 1 - Never ?What is the last grade level you completed in school?: 12th grade ? ?Exercise ?Current Exercise Habits: Structured exercise class, Type of exercise: Other - see comments (Physical therapy), Time (Minutes): 60, Frequency (Times/Week): 3, Weekly Exercise (Minutes/Week): 180, Intensity: Moderate, Exercise limited by: orthopedic condition(s) ? ?Diet ?Patient reports consuming  1.5  meals a day and 0 snack(s) a day ?Patient reports that her primary diet is: Regular ?Patient reports that she does have regular access to food.  ? ?Depression Screen ? ?  07/19/2021  ? 10:47 AM 04/10/2021  ?  1:30 PM 01/05/2021  ?  3:57 PM 08/17/2020  ? 10:17 AM 07/08/2020  ?  2:15 PM 04/12/2020  ?  3:18 PM 04/30/2019  ?  2:26 PM  ?PHQ 2/9 Scores  ?PHQ - 2 Score 1 0 2 3 1 6 3   ?PHQ- 9 Score 1  6 8  18 9   ?  ? ?Fall Risk ? ?  07/18/2021  ?  6:38 PM 05/18/2021  ?  3:53 PM 04/10/2021  ?  1:30 PM 07/08/2020  ?  2:15 PM 04/12/2020  ?  3:27 PM  ?Fall Risk    ?Falls in the past year? 1 1 0 0 1  ?Number falls in past yr: 0 0 0 0 0  ?Injury with Fall? 1 1 0 0 0  ?Risk for fall due to : History of fall(s);Orthopedic patient No Fall Risks No Fall Risks No Fall

## 2021-07-20 DIAGNOSIS — R293 Abnormal posture: Secondary | ICD-10-CM | POA: Diagnosis not present

## 2021-07-20 DIAGNOSIS — M6281 Muscle weakness (generalized): Secondary | ICD-10-CM | POA: Diagnosis not present

## 2021-07-20 DIAGNOSIS — R262 Difficulty in walking, not elsewhere classified: Secondary | ICD-10-CM | POA: Diagnosis not present

## 2021-07-20 DIAGNOSIS — M25562 Pain in left knee: Secondary | ICD-10-CM | POA: Diagnosis not present

## 2021-07-25 DIAGNOSIS — M25562 Pain in left knee: Secondary | ICD-10-CM | POA: Diagnosis not present

## 2021-07-25 DIAGNOSIS — M6281 Muscle weakness (generalized): Secondary | ICD-10-CM | POA: Diagnosis not present

## 2021-07-25 DIAGNOSIS — R262 Difficulty in walking, not elsewhere classified: Secondary | ICD-10-CM | POA: Diagnosis not present

## 2021-07-25 DIAGNOSIS — R293 Abnormal posture: Secondary | ICD-10-CM | POA: Diagnosis not present

## 2021-07-27 DIAGNOSIS — H2513 Age-related nuclear cataract, bilateral: Secondary | ICD-10-CM | POA: Diagnosis not present

## 2021-07-27 DIAGNOSIS — H524 Presbyopia: Secondary | ICD-10-CM | POA: Diagnosis not present

## 2021-07-27 DIAGNOSIS — E119 Type 2 diabetes mellitus without complications: Secondary | ICD-10-CM | POA: Diagnosis not present

## 2021-07-27 DIAGNOSIS — D3131 Benign neoplasm of right choroid: Secondary | ICD-10-CM | POA: Diagnosis not present

## 2021-07-27 DIAGNOSIS — H35033 Hypertensive retinopathy, bilateral: Secondary | ICD-10-CM | POA: Diagnosis not present

## 2021-07-27 LAB — HM DIABETES EYE EXAM

## 2021-07-28 DIAGNOSIS — M6281 Muscle weakness (generalized): Secondary | ICD-10-CM | POA: Diagnosis not present

## 2021-07-28 DIAGNOSIS — R293 Abnormal posture: Secondary | ICD-10-CM | POA: Diagnosis not present

## 2021-07-28 DIAGNOSIS — M25562 Pain in left knee: Secondary | ICD-10-CM | POA: Diagnosis not present

## 2021-07-28 DIAGNOSIS — R262 Difficulty in walking, not elsewhere classified: Secondary | ICD-10-CM | POA: Diagnosis not present

## 2021-07-30 ENCOUNTER — Other Ambulatory Visit: Payer: Self-pay | Admitting: Family Medicine

## 2021-07-31 DIAGNOSIS — R918 Other nonspecific abnormal finding of lung field: Secondary | ICD-10-CM | POA: Diagnosis not present

## 2021-07-31 DIAGNOSIS — R059 Cough, unspecified: Secondary | ICD-10-CM | POA: Diagnosis not present

## 2021-07-31 DIAGNOSIS — R9389 Abnormal findings on diagnostic imaging of other specified body structures: Secondary | ICD-10-CM | POA: Diagnosis not present

## 2021-07-31 DIAGNOSIS — I2510 Atherosclerotic heart disease of native coronary artery without angina pectoris: Secondary | ICD-10-CM | POA: Diagnosis not present

## 2021-08-01 DIAGNOSIS — R262 Difficulty in walking, not elsewhere classified: Secondary | ICD-10-CM | POA: Diagnosis not present

## 2021-08-01 DIAGNOSIS — M6281 Muscle weakness (generalized): Secondary | ICD-10-CM | POA: Diagnosis not present

## 2021-08-01 DIAGNOSIS — R293 Abnormal posture: Secondary | ICD-10-CM | POA: Diagnosis not present

## 2021-08-01 DIAGNOSIS — M25562 Pain in left knee: Secondary | ICD-10-CM | POA: Diagnosis not present

## 2021-08-03 DIAGNOSIS — R293 Abnormal posture: Secondary | ICD-10-CM | POA: Diagnosis not present

## 2021-08-03 DIAGNOSIS — M25562 Pain in left knee: Secondary | ICD-10-CM | POA: Diagnosis not present

## 2021-08-03 DIAGNOSIS — R262 Difficulty in walking, not elsewhere classified: Secondary | ICD-10-CM | POA: Diagnosis not present

## 2021-08-03 DIAGNOSIS — M6281 Muscle weakness (generalized): Secondary | ICD-10-CM | POA: Diagnosis not present

## 2021-08-07 ENCOUNTER — Encounter: Payer: Self-pay | Admitting: Family Medicine

## 2021-08-07 DIAGNOSIS — M6281 Muscle weakness (generalized): Secondary | ICD-10-CM | POA: Diagnosis not present

## 2021-08-07 DIAGNOSIS — R293 Abnormal posture: Secondary | ICD-10-CM | POA: Diagnosis not present

## 2021-08-07 DIAGNOSIS — R262 Difficulty in walking, not elsewhere classified: Secondary | ICD-10-CM | POA: Diagnosis not present

## 2021-08-07 DIAGNOSIS — M25562 Pain in left knee: Secondary | ICD-10-CM | POA: Diagnosis not present

## 2021-08-10 DIAGNOSIS — M25562 Pain in left knee: Secondary | ICD-10-CM | POA: Diagnosis not present

## 2021-08-10 DIAGNOSIS — R262 Difficulty in walking, not elsewhere classified: Secondary | ICD-10-CM | POA: Diagnosis not present

## 2021-08-10 DIAGNOSIS — R293 Abnormal posture: Secondary | ICD-10-CM | POA: Diagnosis not present

## 2021-08-10 DIAGNOSIS — M6281 Muscle weakness (generalized): Secondary | ICD-10-CM | POA: Diagnosis not present

## 2021-08-23 ENCOUNTER — Encounter: Payer: Self-pay | Admitting: Family Medicine

## 2021-08-24 ENCOUNTER — Other Ambulatory Visit: Payer: Self-pay | Admitting: Family Medicine

## 2021-08-24 DIAGNOSIS — G4700 Insomnia, unspecified: Secondary | ICD-10-CM

## 2021-08-24 MED ORDER — DOXEPIN HCL 10 MG PO CAPS: ORAL_CAPSULE | ORAL | 3 refills | Status: DC

## 2021-08-24 NOTE — Telephone Encounter (Signed)
Meds ordered this encounter  Medications   doxepin (SINEQUAN) 10 MG capsule    Sig: TAKE 1 CAPSULE BY MOUTH EVERYDAY AT BEDTIME    Dispense:  90 capsule    Refill:  3

## 2021-09-04 DIAGNOSIS — R059 Cough, unspecified: Secondary | ICD-10-CM | POA: Diagnosis not present

## 2021-09-04 DIAGNOSIS — R9389 Abnormal findings on diagnostic imaging of other specified body structures: Secondary | ICD-10-CM | POA: Diagnosis not present

## 2021-09-12 ENCOUNTER — Encounter: Payer: Self-pay | Admitting: Family Medicine

## 2021-09-13 DIAGNOSIS — G8918 Other acute postprocedural pain: Secondary | ICD-10-CM | POA: Diagnosis not present

## 2021-09-13 DIAGNOSIS — M1811 Unilateral primary osteoarthritis of first carpometacarpal joint, right hand: Secondary | ICD-10-CM | POA: Diagnosis not present

## 2021-09-13 HISTORY — PX: OTHER SURGICAL HISTORY: SHX169

## 2021-09-20 DIAGNOSIS — Z4789 Encounter for other orthopedic aftercare: Secondary | ICD-10-CM | POA: Diagnosis not present

## 2021-09-21 ENCOUNTER — Ambulatory Visit: Payer: Medicare HMO | Admitting: Family Medicine

## 2021-09-26 DIAGNOSIS — M6281 Muscle weakness (generalized): Secondary | ICD-10-CM | POA: Diagnosis not present

## 2021-09-26 DIAGNOSIS — M25631 Stiffness of right wrist, not elsewhere classified: Secondary | ICD-10-CM | POA: Diagnosis not present

## 2021-09-26 DIAGNOSIS — M25531 Pain in right wrist: Secondary | ICD-10-CM | POA: Diagnosis not present

## 2021-09-26 DIAGNOSIS — Z4789 Encounter for other orthopedic aftercare: Secondary | ICD-10-CM | POA: Diagnosis not present

## 2021-09-26 DIAGNOSIS — M79641 Pain in right hand: Secondary | ICD-10-CM | POA: Diagnosis not present

## 2021-09-28 DIAGNOSIS — M25531 Pain in right wrist: Secondary | ICD-10-CM | POA: Diagnosis not present

## 2021-09-28 DIAGNOSIS — M25631 Stiffness of right wrist, not elsewhere classified: Secondary | ICD-10-CM | POA: Diagnosis not present

## 2021-09-28 DIAGNOSIS — M79641 Pain in right hand: Secondary | ICD-10-CM | POA: Diagnosis not present

## 2021-09-28 DIAGNOSIS — M6281 Muscle weakness (generalized): Secondary | ICD-10-CM | POA: Diagnosis not present

## 2021-09-28 DIAGNOSIS — Z4789 Encounter for other orthopedic aftercare: Secondary | ICD-10-CM | POA: Diagnosis not present

## 2021-10-03 DIAGNOSIS — M25531 Pain in right wrist: Secondary | ICD-10-CM | POA: Diagnosis not present

## 2021-10-03 DIAGNOSIS — Z4789 Encounter for other orthopedic aftercare: Secondary | ICD-10-CM | POA: Diagnosis not present

## 2021-10-03 DIAGNOSIS — M6281 Muscle weakness (generalized): Secondary | ICD-10-CM | POA: Diagnosis not present

## 2021-10-03 DIAGNOSIS — M79641 Pain in right hand: Secondary | ICD-10-CM | POA: Diagnosis not present

## 2021-10-03 DIAGNOSIS — M25631 Stiffness of right wrist, not elsewhere classified: Secondary | ICD-10-CM | POA: Diagnosis not present

## 2021-10-06 DIAGNOSIS — M25531 Pain in right wrist: Secondary | ICD-10-CM | POA: Diagnosis not present

## 2021-10-06 DIAGNOSIS — M25631 Stiffness of right wrist, not elsewhere classified: Secondary | ICD-10-CM | POA: Diagnosis not present

## 2021-10-06 DIAGNOSIS — Z4789 Encounter for other orthopedic aftercare: Secondary | ICD-10-CM | POA: Diagnosis not present

## 2021-10-06 DIAGNOSIS — M6281 Muscle weakness (generalized): Secondary | ICD-10-CM | POA: Diagnosis not present

## 2021-10-06 DIAGNOSIS — M79641 Pain in right hand: Secondary | ICD-10-CM | POA: Diagnosis not present

## 2021-10-10 DIAGNOSIS — M6281 Muscle weakness (generalized): Secondary | ICD-10-CM | POA: Diagnosis not present

## 2021-10-10 DIAGNOSIS — M25631 Stiffness of right wrist, not elsewhere classified: Secondary | ICD-10-CM | POA: Diagnosis not present

## 2021-10-10 DIAGNOSIS — M79641 Pain in right hand: Secondary | ICD-10-CM | POA: Diagnosis not present

## 2021-10-10 DIAGNOSIS — M25531 Pain in right wrist: Secondary | ICD-10-CM | POA: Diagnosis not present

## 2021-10-10 DIAGNOSIS — Z4789 Encounter for other orthopedic aftercare: Secondary | ICD-10-CM | POA: Diagnosis not present

## 2021-10-12 ENCOUNTER — Ambulatory Visit (INDEPENDENT_AMBULATORY_CARE_PROVIDER_SITE_OTHER): Payer: Medicare HMO | Admitting: Family Medicine

## 2021-10-12 ENCOUNTER — Encounter: Payer: Self-pay | Admitting: Family Medicine

## 2021-10-12 VITALS — BP 114/63 | HR 87 | Ht 62.0 in | Wt 145.0 lb

## 2021-10-12 DIAGNOSIS — N1831 Chronic kidney disease, stage 3a: Secondary | ICD-10-CM | POA: Diagnosis not present

## 2021-10-12 DIAGNOSIS — M79641 Pain in right hand: Secondary | ICD-10-CM | POA: Diagnosis not present

## 2021-10-12 DIAGNOSIS — I2510 Atherosclerotic heart disease of native coronary artery without angina pectoris: Secondary | ICD-10-CM | POA: Diagnosis not present

## 2021-10-12 DIAGNOSIS — T50905A Adverse effect of unspecified drugs, medicaments and biological substances, initial encounter: Secondary | ICD-10-CM

## 2021-10-12 DIAGNOSIS — M6281 Muscle weakness (generalized): Secondary | ICD-10-CM | POA: Diagnosis not present

## 2021-10-12 DIAGNOSIS — M25631 Stiffness of right wrist, not elsewhere classified: Secondary | ICD-10-CM | POA: Diagnosis not present

## 2021-10-12 DIAGNOSIS — I10 Essential (primary) hypertension: Secondary | ICD-10-CM | POA: Diagnosis not present

## 2021-10-12 DIAGNOSIS — F331 Major depressive disorder, recurrent, moderate: Secondary | ICD-10-CM | POA: Diagnosis not present

## 2021-10-12 DIAGNOSIS — I2584 Coronary atherosclerosis due to calcified coronary lesion: Secondary | ICD-10-CM | POA: Diagnosis not present

## 2021-10-12 DIAGNOSIS — E1122 Type 2 diabetes mellitus with diabetic chronic kidney disease: Secondary | ICD-10-CM | POA: Diagnosis not present

## 2021-10-12 DIAGNOSIS — M25531 Pain in right wrist: Secondary | ICD-10-CM | POA: Diagnosis not present

## 2021-10-12 DIAGNOSIS — Z4789 Encounter for other orthopedic aftercare: Secondary | ICD-10-CM | POA: Diagnosis not present

## 2021-10-12 LAB — POCT GLYCOSYLATED HEMOGLOBIN (HGB A1C): Hemoglobin A1C: 6.4 % — AB (ref 4.0–5.6)

## 2021-10-12 LAB — POCT UA - MICROALBUMIN
Albumin/Creatinine Ratio, Urine, POC: <30
Creatinine, POC: 200 mg/dL
Microalbumin Ur, POC: 80 mg/L

## 2021-10-12 NOTE — Assessment & Plan Note (Signed)
Mood scores look great today but she is also been off of work which is one of her major stressors.

## 2021-10-12 NOTE — Assessment & Plan Note (Signed)
A1c still looks great today even though she has been eating with a little bit more indiscretion.  Just encouraged her to get back on track and make sure she is staying active as well.

## 2021-10-12 NOTE — Assessment & Plan Note (Signed)
Noted on chest CT from May 2023.  She does take a daily statin.

## 2021-10-12 NOTE — Assessment & Plan Note (Signed)
Well-controlled.  Continue current regimen. 

## 2021-10-12 NOTE — Assessment & Plan Note (Signed)
Follows with Dr. Duffy Bruce.  But also continuing to check renal function at least every 6 months.

## 2021-10-12 NOTE — Progress Notes (Signed)
Established Patient Office Visit  Subjective   Patient ID: Bianca Mckee, female    DOB: [birth year]  Age: 71 y.o. MRN: [id number]  Chief Complaint  Patient presents with   Diabetes    HPI  Hypertension- Pt denies chest pain, SOB, dizziness, or heart palpitations.  Taking meds as directed w/o problems.  Denies medication side effects.    Diabetes - no hypoglycemic events. No wounds or sores that are not healing well. No increased thirst or urination. Checking glucose at home. Taking medications as prescribed without any side effects.  She admits that she has been eating a lot more since she has been out of work since her surgery.  S/P right CMC joint arthroplasty surgery on June 28.  Her follow-up with the surgeon is on August 9.  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  She has a follow-up chest CT in August.  It is a repeat from the CT ordered in May.  At that time she was sick and had a persistent cough.  CT showed ill-defined groundglass within the upper lobes and a posterior left upper lobe 2 mm noncalcified nodule.  Coronary calcifications were noted.  She was also given some prednisone while she was sick and she said that prednisone makes her blood pressure shoot up.  About midway through she had to discontinue the medication because her blood pressures were going into the 160s over 100s.    ROS    Objective:     BP 114/63   Pulse 87   Ht '5\' 2"'$  (1.575 m)   Wt 145 lb (65.8 kg)   SpO2 98%   BMI 26.52 kg/m    Physical Exam Vitals and nursing note reviewed.  Constitutional:      Appearance: She is well-developed.  HENT:     Head: Normocephalic and atraumatic.  Cardiovascular:     Rate and Rhythm: Normal rate and regular rhythm.     Heart sounds: Normal heart sounds.   Pulmonary:     Effort: Pulmonary effort is normal.     Breath sounds: Normal breath sounds.  Musculoskeletal:     Comments: Bruising and only at the base of the right thumb.  Incision looks great.  Skin:    General: Skin is warm and dry.  Neurological:     Mental Status: She is alert and oriented to person, place, and time.  Psychiatric:        Behavior: Behavior normal.      Results for orders placed or performed in visit on 10/12/21  POCT glycosylated hemoglobin (Hb A1C)  Result Value Ref Range   Hemoglobin A1C 6.4 (A) 4.0 - 5.6 %   HbA1c POC (<> result, manual entry)     HbA1c, POC (prediabetic range)     HbA1c, POC (controlled diabetic range)    POCT UA - Microalbumin  Result Value Ref Range   Microalbumin Ur, POC 80 mg/L   Creatinine, POC 200 mg/dL   Albumin/Creatinine Ratio, Urine, POC <30       The 10-year ASCVD risk score (Arnett DK, et al., 2019) is: 15.5%    Assessment & Plan:   Problem List Items Addressed This Visit       Cardiovascular and Mediastinum  HYPERTENSION, BENIGN SYSTEMIC - Primary    Well-controlled.  Continue current regimen.      Relevant Orders   Lipid Panel w/reflex Direct LDL   COMPLETE METABOLIC PANEL WITH GFR   CBC   Coronary artery calcification    Noted on chest CT from May 2023.  She does take a daily statin.        Endocrine   Type 2 diabetes mellitus with stage 3a chronic kidney disease, without long-term current use of insulin (HCC)    A1c still looks great today even though she has been eating with a little bit more indiscretion.  Just encouraged her to get back on track and make sure she is staying active as well.      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  Duffy Bruce.  But also continuing to check renal function at least every 6 months.      Other Visit Diagnoses     Medication side effect, initial encounter          Added prednisone to intolerance list.   Return in about 4 months (around 02/12/2022) for Diabetes follow-up and labs .    Beatrice Lecher, MD

## 2021-10-13 LAB — CBC
HCT: 38.8 % (ref 35.0–45.0)
Hemoglobin: 12.6 g/dL (ref 11.7–15.5)
MCH: 30.4 pg (ref 27.0–33.0)
MCHC: 32.5 g/dL (ref 32.0–36.0)
MCV: 93.7 fL (ref 80.0–100.0)
MPV: 11.2 fL (ref 7.5–12.5)
Platelets: 270 10*3/uL (ref 140–400)
RBC: 4.14 10*6/uL (ref 3.80–5.10)
RDW: 12.3 % (ref 11.0–15.0)
WBC: 6.8 10*3/uL (ref 3.8–10.8)

## 2021-10-13 LAB — LIPID PANEL W/REFLEX DIRECT LDL
Cholesterol: 288 mg/dL — ABNORMAL HIGH (ref <200)
HDL: 45 mg/dL — ABNORMAL LOW (ref >=50)
LDL Cholesterol (Calc): 207 mg/dL (calc) — ABNORMAL HIGH
Non-HDL Cholesterol (Calc): 243 mg/dL (calc) — ABNORMAL HIGH (ref <130)
Total CHOL/HDL Ratio: 6.4 (calc) — ABNORMAL HIGH (ref <5.0)
Triglycerides: 184 mg/dL — ABNORMAL HIGH (ref <150)

## 2021-10-13 LAB — COMPLETE METABOLIC PANEL WITH GFR
AG Ratio: 1.8 (calc) (ref 1.0–2.5)
ALT: 10 U/L (ref 6–29)
AST: 14 U/L (ref 10–35)
Albumin: 4.4 g/dL (ref 3.6–5.1)
Alkaline phosphatase (APISO): 78 U/L (ref 37–153)
BUN: 19 mg/dL (ref 7–25)
CO2: 25 mmol/L (ref 20–32)
Calcium: 9.5 mg/dL (ref 8.6–10.4)
Chloride: 104 mmol/L (ref 98–110)
Creat: 0.87 mg/dL (ref 0.50–1.05)
Globulin: 2.4 g/dL (calc) (ref 1.9–3.7)
Glucose, Bld: 159 mg/dL — ABNORMAL HIGH (ref 65–99)
Potassium: 3.7 mmol/L (ref 3.5–5.3)
Sodium: 142 mmol/L (ref 135–146)
Total Bilirubin: 0.5 mg/dL (ref 0.2–1.2)
Total Protein: 6.8 g/dL (ref 6.1–8.1)
eGFR: 73 mL/min/{1.73_m2} (ref >=60)

## 2021-10-13 NOTE — Progress Notes (Signed)
Hi Bianca Mckee, you look a little better hydrated this time compared to last time.  Kidney function is stable.  LDL cholesterol jumped up quite a bit compared to last year.  Are you taking your Lipitor consistently?  You definitely need to be on it it makes such a big difference in your cholesterol and really does reduce your risk for heart attack and stroke.  If you need refills let me know.  Blood count is normal.

## 2021-10-16 ENCOUNTER — Other Ambulatory Visit: Payer: Self-pay | Admitting: Family Medicine

## 2021-10-16 DIAGNOSIS — M6281 Muscle weakness (generalized): Secondary | ICD-10-CM | POA: Diagnosis not present

## 2021-10-16 DIAGNOSIS — M79641 Pain in right hand: Secondary | ICD-10-CM | POA: Diagnosis not present

## 2021-10-16 DIAGNOSIS — M25631 Stiffness of right wrist, not elsewhere classified: Secondary | ICD-10-CM | POA: Diagnosis not present

## 2021-10-16 DIAGNOSIS — Z4789 Encounter for other orthopedic aftercare: Secondary | ICD-10-CM | POA: Diagnosis not present

## 2021-10-16 DIAGNOSIS — M25531 Pain in right wrist: Secondary | ICD-10-CM | POA: Diagnosis not present

## 2021-10-18 DIAGNOSIS — M25631 Stiffness of right wrist, not elsewhere classified: Secondary | ICD-10-CM | POA: Diagnosis not present

## 2021-10-18 DIAGNOSIS — Z4789 Encounter for other orthopedic aftercare: Secondary | ICD-10-CM | POA: Diagnosis not present

## 2021-10-18 DIAGNOSIS — M6281 Muscle weakness (generalized): Secondary | ICD-10-CM | POA: Diagnosis not present

## 2021-10-18 DIAGNOSIS — M25531 Pain in right wrist: Secondary | ICD-10-CM | POA: Diagnosis not present

## 2021-10-18 DIAGNOSIS — M79641 Pain in right hand: Secondary | ICD-10-CM | POA: Diagnosis not present

## 2021-10-23 DIAGNOSIS — M25531 Pain in right wrist: Secondary | ICD-10-CM | POA: Diagnosis not present

## 2021-10-23 DIAGNOSIS — M6281 Muscle weakness (generalized): Secondary | ICD-10-CM | POA: Diagnosis not present

## 2021-10-23 DIAGNOSIS — M79641 Pain in right hand: Secondary | ICD-10-CM | POA: Diagnosis not present

## 2021-10-23 DIAGNOSIS — Z4789 Encounter for other orthopedic aftercare: Secondary | ICD-10-CM | POA: Diagnosis not present

## 2021-10-23 DIAGNOSIS — M25631 Stiffness of right wrist, not elsewhere classified: Secondary | ICD-10-CM | POA: Diagnosis not present

## 2021-10-25 DIAGNOSIS — M6281 Muscle weakness (generalized): Secondary | ICD-10-CM | POA: Diagnosis not present

## 2021-10-25 DIAGNOSIS — M25631 Stiffness of right wrist, not elsewhere classified: Secondary | ICD-10-CM | POA: Diagnosis not present

## 2021-10-25 DIAGNOSIS — M79641 Pain in right hand: Secondary | ICD-10-CM | POA: Diagnosis not present

## 2021-10-25 DIAGNOSIS — M25531 Pain in right wrist: Secondary | ICD-10-CM | POA: Diagnosis not present

## 2021-10-25 DIAGNOSIS — Z4789 Encounter for other orthopedic aftercare: Secondary | ICD-10-CM | POA: Diagnosis not present

## 2021-10-30 DIAGNOSIS — M25531 Pain in right wrist: Secondary | ICD-10-CM | POA: Diagnosis not present

## 2021-10-30 DIAGNOSIS — Z4789 Encounter for other orthopedic aftercare: Secondary | ICD-10-CM | POA: Diagnosis not present

## 2021-10-30 DIAGNOSIS — M6281 Muscle weakness (generalized): Secondary | ICD-10-CM | POA: Diagnosis not present

## 2021-10-30 DIAGNOSIS — M25631 Stiffness of right wrist, not elsewhere classified: Secondary | ICD-10-CM | POA: Diagnosis not present

## 2021-10-30 DIAGNOSIS — M79641 Pain in right hand: Secondary | ICD-10-CM | POA: Diagnosis not present

## 2021-11-01 DIAGNOSIS — M79641 Pain in right hand: Secondary | ICD-10-CM | POA: Diagnosis not present

## 2021-11-01 DIAGNOSIS — M25531 Pain in right wrist: Secondary | ICD-10-CM | POA: Diagnosis not present

## 2021-11-01 DIAGNOSIS — Z4789 Encounter for other orthopedic aftercare: Secondary | ICD-10-CM | POA: Diagnosis not present

## 2021-11-01 DIAGNOSIS — M6281 Muscle weakness (generalized): Secondary | ICD-10-CM | POA: Diagnosis not present

## 2021-11-01 DIAGNOSIS — M25631 Stiffness of right wrist, not elsewhere classified: Secondary | ICD-10-CM | POA: Diagnosis not present

## 2021-11-08 DIAGNOSIS — M25531 Pain in right wrist: Secondary | ICD-10-CM | POA: Diagnosis not present

## 2021-11-08 DIAGNOSIS — M79641 Pain in right hand: Secondary | ICD-10-CM | POA: Diagnosis not present

## 2021-11-08 DIAGNOSIS — M6281 Muscle weakness (generalized): Secondary | ICD-10-CM | POA: Diagnosis not present

## 2021-11-08 DIAGNOSIS — M25631 Stiffness of right wrist, not elsewhere classified: Secondary | ICD-10-CM | POA: Diagnosis not present

## 2021-11-08 DIAGNOSIS — Z4789 Encounter for other orthopedic aftercare: Secondary | ICD-10-CM | POA: Diagnosis not present

## 2021-11-13 DIAGNOSIS — M25631 Stiffness of right wrist, not elsewhere classified: Secondary | ICD-10-CM | POA: Diagnosis not present

## 2021-11-13 DIAGNOSIS — M79641 Pain in right hand: Secondary | ICD-10-CM | POA: Diagnosis not present

## 2021-11-13 DIAGNOSIS — M25531 Pain in right wrist: Secondary | ICD-10-CM | POA: Diagnosis not present

## 2021-11-13 DIAGNOSIS — Z4789 Encounter for other orthopedic aftercare: Secondary | ICD-10-CM | POA: Diagnosis not present

## 2021-11-13 DIAGNOSIS — M6281 Muscle weakness (generalized): Secondary | ICD-10-CM | POA: Diagnosis not present

## 2021-11-15 DIAGNOSIS — M79641 Pain in right hand: Secondary | ICD-10-CM | POA: Diagnosis not present

## 2021-11-15 DIAGNOSIS — M6281 Muscle weakness (generalized): Secondary | ICD-10-CM | POA: Diagnosis not present

## 2021-11-15 DIAGNOSIS — M25631 Stiffness of right wrist, not elsewhere classified: Secondary | ICD-10-CM | POA: Diagnosis not present

## 2021-11-15 DIAGNOSIS — M25531 Pain in right wrist: Secondary | ICD-10-CM | POA: Diagnosis not present

## 2021-11-15 DIAGNOSIS — Z4789 Encounter for other orthopedic aftercare: Secondary | ICD-10-CM | POA: Diagnosis not present

## 2021-11-28 ENCOUNTER — Other Ambulatory Visit: Payer: Self-pay | Admitting: Family Medicine

## 2021-11-28 DIAGNOSIS — G4700 Insomnia, unspecified: Secondary | ICD-10-CM

## 2021-11-29 ENCOUNTER — Encounter: Payer: Self-pay | Admitting: Family Medicine

## 2021-11-29 DIAGNOSIS — Z4789 Encounter for other orthopedic aftercare: Secondary | ICD-10-CM | POA: Diagnosis not present

## 2021-11-29 DIAGNOSIS — M25631 Stiffness of right wrist, not elsewhere classified: Secondary | ICD-10-CM | POA: Diagnosis not present

## 2021-11-29 DIAGNOSIS — M79641 Pain in right hand: Secondary | ICD-10-CM | POA: Diagnosis not present

## 2021-11-29 DIAGNOSIS — M6281 Muscle weakness (generalized): Secondary | ICD-10-CM | POA: Diagnosis not present

## 2021-11-29 DIAGNOSIS — M25531 Pain in right wrist: Secondary | ICD-10-CM | POA: Diagnosis not present

## 2021-12-01 DIAGNOSIS — M25631 Stiffness of right wrist, not elsewhere classified: Secondary | ICD-10-CM | POA: Diagnosis not present

## 2021-12-01 DIAGNOSIS — M79641 Pain in right hand: Secondary | ICD-10-CM | POA: Diagnosis not present

## 2021-12-01 DIAGNOSIS — Z4789 Encounter for other orthopedic aftercare: Secondary | ICD-10-CM | POA: Diagnosis not present

## 2021-12-01 DIAGNOSIS — M25531 Pain in right wrist: Secondary | ICD-10-CM | POA: Diagnosis not present

## 2021-12-01 DIAGNOSIS — M6281 Muscle weakness (generalized): Secondary | ICD-10-CM | POA: Diagnosis not present

## 2021-12-13 DIAGNOSIS — M6281 Muscle weakness (generalized): Secondary | ICD-10-CM | POA: Diagnosis not present

## 2021-12-13 DIAGNOSIS — M25631 Stiffness of right wrist, not elsewhere classified: Secondary | ICD-10-CM | POA: Diagnosis not present

## 2021-12-13 DIAGNOSIS — M79641 Pain in right hand: Secondary | ICD-10-CM | POA: Diagnosis not present

## 2021-12-13 DIAGNOSIS — Z4789 Encounter for other orthopedic aftercare: Secondary | ICD-10-CM | POA: Diagnosis not present

## 2021-12-13 DIAGNOSIS — M25531 Pain in right wrist: Secondary | ICD-10-CM | POA: Diagnosis not present

## 2021-12-19 ENCOUNTER — Other Ambulatory Visit: Payer: Self-pay | Admitting: Family Medicine

## 2021-12-19 DIAGNOSIS — F331 Major depressive disorder, recurrent, moderate: Secondary | ICD-10-CM

## 2021-12-19 MED ORDER — SERTRALINE HCL 100 MG PO TABS: 150.0000 mg | ORAL_TABLET | Freq: Every day | ORAL | 1 refills | Status: DC

## 2021-12-27 DIAGNOSIS — M25531 Pain in right wrist: Secondary | ICD-10-CM | POA: Diagnosis not present

## 2021-12-27 DIAGNOSIS — M6281 Muscle weakness (generalized): Secondary | ICD-10-CM | POA: Diagnosis not present

## 2021-12-27 DIAGNOSIS — Z4789 Encounter for other orthopedic aftercare: Secondary | ICD-10-CM | POA: Diagnosis not present

## 2021-12-27 DIAGNOSIS — M25631 Stiffness of right wrist, not elsewhere classified: Secondary | ICD-10-CM | POA: Diagnosis not present

## 2021-12-27 DIAGNOSIS — M79641 Pain in right hand: Secondary | ICD-10-CM | POA: Diagnosis not present

## 2022-01-03 DIAGNOSIS — M25531 Pain in right wrist: Secondary | ICD-10-CM | POA: Diagnosis not present

## 2022-01-03 DIAGNOSIS — Z4789 Encounter for other orthopedic aftercare: Secondary | ICD-10-CM | POA: Diagnosis not present

## 2022-01-03 DIAGNOSIS — M79641 Pain in right hand: Secondary | ICD-10-CM | POA: Diagnosis not present

## 2022-01-03 DIAGNOSIS — M25631 Stiffness of right wrist, not elsewhere classified: Secondary | ICD-10-CM | POA: Diagnosis not present

## 2022-01-03 DIAGNOSIS — M6281 Muscle weakness (generalized): Secondary | ICD-10-CM | POA: Diagnosis not present

## 2022-01-10 DIAGNOSIS — M25631 Stiffness of right wrist, not elsewhere classified: Secondary | ICD-10-CM | POA: Diagnosis not present

## 2022-01-10 DIAGNOSIS — Z4789 Encounter for other orthopedic aftercare: Secondary | ICD-10-CM | POA: Diagnosis not present

## 2022-01-10 DIAGNOSIS — M25531 Pain in right wrist: Secondary | ICD-10-CM | POA: Diagnosis not present

## 2022-01-10 DIAGNOSIS — M6281 Muscle weakness (generalized): Secondary | ICD-10-CM | POA: Diagnosis not present

## 2022-01-10 DIAGNOSIS — M79641 Pain in right hand: Secondary | ICD-10-CM | POA: Diagnosis not present

## 2022-01-11 ENCOUNTER — Other Ambulatory Visit: Payer: Self-pay | Admitting: Family Medicine

## 2022-01-11 DIAGNOSIS — E785 Hyperlipidemia, unspecified: Secondary | ICD-10-CM

## 2022-01-11 DIAGNOSIS — E119 Type 2 diabetes mellitus without complications: Secondary | ICD-10-CM

## 2022-01-16 DIAGNOSIS — M25631 Stiffness of right wrist, not elsewhere classified: Secondary | ICD-10-CM | POA: Diagnosis not present

## 2022-01-16 DIAGNOSIS — M79641 Pain in right hand: Secondary | ICD-10-CM | POA: Diagnosis not present

## 2022-01-16 DIAGNOSIS — M6281 Muscle weakness (generalized): Secondary | ICD-10-CM | POA: Diagnosis not present

## 2022-01-16 DIAGNOSIS — M25531 Pain in right wrist: Secondary | ICD-10-CM | POA: Diagnosis not present

## 2022-01-16 DIAGNOSIS — Z4789 Encounter for other orthopedic aftercare: Secondary | ICD-10-CM | POA: Diagnosis not present

## 2022-01-28 ENCOUNTER — Other Ambulatory Visit: Payer: Self-pay | Admitting: Family Medicine

## 2022-01-30 DIAGNOSIS — M6281 Muscle weakness (generalized): Secondary | ICD-10-CM | POA: Diagnosis not present

## 2022-01-30 DIAGNOSIS — M25531 Pain in right wrist: Secondary | ICD-10-CM | POA: Diagnosis not present

## 2022-01-30 DIAGNOSIS — Z4789 Encounter for other orthopedic aftercare: Secondary | ICD-10-CM | POA: Diagnosis not present

## 2022-01-30 DIAGNOSIS — M25631 Stiffness of right wrist, not elsewhere classified: Secondary | ICD-10-CM | POA: Diagnosis not present

## 2022-01-30 DIAGNOSIS — M79641 Pain in right hand: Secondary | ICD-10-CM | POA: Diagnosis not present

## 2022-02-07 ENCOUNTER — Other Ambulatory Visit: Payer: Self-pay | Admitting: Family Medicine

## 2022-02-07 DIAGNOSIS — G4700 Insomnia, unspecified: Secondary | ICD-10-CM

## 2022-02-12 ENCOUNTER — Ambulatory Visit: Payer: Medicare HMO | Admitting: Family Medicine

## 2022-02-13 DIAGNOSIS — M1811 Unilateral primary osteoarthritis of first carpometacarpal joint, right hand: Secondary | ICD-10-CM | POA: Diagnosis not present

## 2022-02-13 DIAGNOSIS — M79644 Pain in right finger(s): Secondary | ICD-10-CM | POA: Diagnosis not present

## 2022-02-20 DIAGNOSIS — M25631 Stiffness of right wrist, not elsewhere classified: Secondary | ICD-10-CM | POA: Diagnosis not present

## 2022-02-20 DIAGNOSIS — Z4789 Encounter for other orthopedic aftercare: Secondary | ICD-10-CM | POA: Diagnosis not present

## 2022-02-20 DIAGNOSIS — M6281 Muscle weakness (generalized): Secondary | ICD-10-CM | POA: Diagnosis not present

## 2022-02-20 DIAGNOSIS — M25531 Pain in right wrist: Secondary | ICD-10-CM | POA: Diagnosis not present

## 2022-02-20 DIAGNOSIS — M79641 Pain in right hand: Secondary | ICD-10-CM | POA: Diagnosis not present

## 2022-02-22 ENCOUNTER — Encounter: Payer: Self-pay | Admitting: Family Medicine

## 2022-02-22 ENCOUNTER — Ambulatory Visit (INDEPENDENT_AMBULATORY_CARE_PROVIDER_SITE_OTHER): Payer: Medicare HMO | Admitting: Family Medicine

## 2022-02-22 VITALS — BP 127/83 | HR 87 | Ht 62.0 in | Wt 147.1 lb

## 2022-02-22 DIAGNOSIS — E1122 Type 2 diabetes mellitus with diabetic chronic kidney disease: Secondary | ICD-10-CM | POA: Diagnosis not present

## 2022-02-22 DIAGNOSIS — N1831 Chronic kidney disease, stage 3a: Secondary | ICD-10-CM

## 2022-02-22 DIAGNOSIS — F331 Major depressive disorder, recurrent, moderate: Secondary | ICD-10-CM | POA: Diagnosis not present

## 2022-02-22 DIAGNOSIS — I10 Essential (primary) hypertension: Secondary | ICD-10-CM

## 2022-02-22 LAB — POCT GLYCOSYLATED HEMOGLOBIN (HGB A1C): Hemoglobin A1C: 7.6 % — AB (ref 4.0–5.6)

## 2022-02-22 NOTE — Progress Notes (Signed)
Interviewed and examined the patient personally.  Agree with assessment and plan below in addition did address mood.  She had some stressors when she first went to back to work after her surgery.  They switched systems and she still does not have any help.  But she feels like her medications are helpful.  Major depressive disorder, recurrent episode Stable on the sertraline we will continue current dose no recommendation for changes today.  Work seems to be settling down which is good.

## 2022-02-22 NOTE — Progress Notes (Signed)
   Established Patient Office Visit  Subjective   Patient ID: Bianca Mckee, female    DOB: 06-15-1950  Age: 71 y.o. MRN: [id number]  Chief Complaint  Patient presents with   Diabetes    Diabetes Pertinent negatives for diabetes include no chest pain.   Patient 71 year old female presenting to clinic for diabetic follow up. She says that she expects her A1C to be elevated due to not eating healthy and picking up fast food after work. She has also not be very active outside of work. She has been under a lot of stress at work and still has difficulty sleeping. She takes melatonin at night. Denies any hypoglycemic events. No open sores or wounds.   She finished her last PT session this week for her right hand. She still continues to do the exercises and stretches daily.   Review of Systems  Respiratory:  Negative for cough and shortness of breath.   Cardiovascular:  Negative for chest pain and palpitations.  Psychiatric/Behavioral:  The patient has insomnia.   All other systems reviewed and are negative.     Objective:     BP 127/83 (BP Location: Left Arm, Patient Position: Sitting, Cuff Size: Normal)   Pulse 87   Ht '5\' 2"'$  (1.575 m)   Wt 66.7 kg   SpO2 (!) 87%   BMI 26.91 kg/m   Physical Exam Cardiovascular:     Rate and Rhythm: Normal rate and regular rhythm.  Pulmonary:     Effort: Pulmonary effort is normal.     Breath sounds: Normal breath sounds.  Neurological:     General: No focal deficit present.     Mental Status: She is alert.      Results for orders placed or performed in visit on 02/22/22  POCT HgB A1C  Result Value Ref Range   Hemoglobin A1C 7.6 (A) 4.0 - 5.6 %   HbA1c POC (<> result, manual entry)     HbA1c, POC (prediabetic range)     HbA1c, POC (controlled diabetic range)        The 10-year ASCVD risk score (Arnett DK, et al., 2019) is: 24%    Assessment & Plan:   Problem List Items Addressed This Visit       Cardiovascular and  Mediastinum   HYPERTENSION, BENIGN SYSTEMIC - Primary   Relevant Orders   POCT HgB A1C (Completed)     Endocrine   Type 2 diabetes mellitus with stage 3a chronic kidney disease, without long-term current use of insulin (HCC)   Relevant Orders   POCT HgB A1C (Completed)   Diabetes Unfortunately, A1C increased to 7.6%. Start taking metformin twice daily. Encouraged packing lunch the night before. Encouraged less fast food and limiting carbohydrates and sugars. Increase activity throughout the week. Follow up in 14 weeks.  Return in about 14 weeks (around 05/31/2022) for Diabetes follow-up.    Dorian Heckle, Student-PA

## 2022-02-22 NOTE — Assessment & Plan Note (Signed)
Stable on the sertraline we will continue current dose no recommendation for changes today.  Work seems to be settling down which is good.

## 2022-03-26 ENCOUNTER — Encounter: Payer: Self-pay | Admitting: Family Medicine

## 2022-03-27 MED ORDER — TRAZODONE HCL 50 MG PO TABS: 25.0000 mg | ORAL_TABLET | Freq: Every evening | ORAL | 3 refills | Status: DC | PRN

## 2022-03-27 NOTE — Addendum Note (Signed)
Addended by: Beatrice Lecher D on: 03/27/2022 05:15 PM   Modules accepted: Orders

## 2022-03-27 NOTE — Telephone Encounter (Signed)
K, new prescription sent.  Please discontinue the doxepin.  Meds ordered this encounter  Medications   traZODone (DESYREL) 50 MG tablet    Sig: Take 0.5-1 tablets (25-50 mg total) by mouth at bedtime as needed for sleep.    Dispense:  30 tablet    Refill:  3

## 2022-04-16 DIAGNOSIS — N891 Moderate vaginal dysplasia: Secondary | ICD-10-CM | POA: Diagnosis not present

## 2022-04-19 ENCOUNTER — Other Ambulatory Visit: Payer: Self-pay | Admitting: Family Medicine

## 2022-04-20 DIAGNOSIS — M79641 Pain in right hand: Secondary | ICD-10-CM | POA: Diagnosis not present

## 2022-04-30 ENCOUNTER — Ambulatory Visit (INDEPENDENT_AMBULATORY_CARE_PROVIDER_SITE_OTHER): Payer: Medicare HMO | Admitting: Family Medicine

## 2022-04-30 ENCOUNTER — Encounter: Payer: Self-pay | Admitting: Family Medicine

## 2022-04-30 VITALS — BP 149/70 | HR 76 | Temp 99.5°F | Ht 62.0 in | Wt 147.0 lb

## 2022-04-30 DIAGNOSIS — U071 COVID-19: Secondary | ICD-10-CM

## 2022-04-30 DIAGNOSIS — R051 Acute cough: Secondary | ICD-10-CM

## 2022-04-30 LAB — POCT INFLUENZA A/B
Influenza A, POC: NEGATIVE
Influenza B, POC: NEGATIVE

## 2022-04-30 LAB — POC COVID19 BINAXNOW: SARS Coronavirus 2 Ag: POSITIVE — AB

## 2022-04-30 MED ORDER — BENZONATATE 200 MG PO CAPS: 200.0000 mg | ORAL_CAPSULE | Freq: Two times a day (BID) | ORAL | 0 refills | Status: DC | PRN

## 2022-04-30 MED ORDER — HYDROCODONE BIT-HOMATROP MBR 5-1.5 MG/5ML PO SOLN: 5.0000 mL | Freq: Every evening | ORAL | 0 refills | Status: DC | PRN

## 2022-04-30 NOTE — Progress Notes (Signed)
Sxs started on Friday. She has cough, head congestion, she has been taking mucinex, nyquill and is currently taking an ABX Amoxicillin 500 mg 1 tab Q 8hrs because she is scheduled to have some dental work on Thursday.

## 2022-04-30 NOTE — Progress Notes (Signed)
Acute Office Visit  Subjective:     Patient ID: Bianca Mckee, female    DOB: 1950/08/15, 72 y.o.   MRN: [id number]  Chief Complaint  Patient presents with   Cough   Nasal Congestion    HPI Patient is in today for mild cough and nasal congestion x 3 days..  Also has a runny nose.  Slight  fever.  Woke up with a mild irritated throat today.  She actually started amoxicillin on Friday right before the symptoms started for a dental infection.  ROS      Objective:    BP (!) 149/70   Pulse 76   Temp 99.5 F (37.5 C)   Ht 5' 2"$  (1.575 m)   Wt 147 lb (66.7 kg)   SpO2 100%   BMI 26.89 kg/m    Physical Exam Constitutional:      Appearance: Normal appearance. She is well-developed.  HENT:     Head: Normocephalic and atraumatic.     Right Ear: Tympanic membrane, ear canal and external ear normal.     Left Ear: Tympanic membrane, ear canal and external ear normal.     Nose: Nose normal.     Mouth/Throat:     Pharynx: Oropharynx is clear.  Eyes:     Conjunctiva/sclera: Conjunctivae normal.     Pupils: Pupils are equal, round, and reactive to light.  Neck:     Thyroid: No thyromegaly.  Cardiovascular:     Rate and Rhythm: Normal rate and regular rhythm.     Heart sounds: Normal heart sounds.  Pulmonary:     Effort: Pulmonary effort is normal.     Breath sounds: Normal breath sounds. No wheezing.  Musculoskeletal:     Cervical back: Neck supple.  Lymphadenopathy:     Cervical: No cervical adenopathy.  Skin:    General: Skin is warm and dry.  Neurological:     Mental Status: She is alert and oriented to person, place, and time.  Psychiatric:        Mood and Affect: Mood normal.        Behavior: Behavior normal.     Results for orders placed or performed in visit on 04/30/22  POC COVID-19  Result Value Ref Range   SARS Coronavirus 2 Ag Positive (A) Negative  POCT Influenza A/B  Result Value Ref Range   Influenza A, POC Negative Negative   Influenza B,  POC Negative Negative        Assessment & Plan:   Problem List Items Addressed This Visit   None Visit Diagnoses     Acute cough    -  Primary   Relevant Medications   benzonatate (TESSALON) 200 MG capsule   HYDROcodone bit-homatropine (HYCODAN) 5-1.5 MG/5ML syrup   Other Relevant Orders   POC COVID-19 (Completed)   POCT Influenza A/B (Completed)   COVID-19       Relevant Medications   benzonatate (TESSALON) 200 MG capsule   HYDROcodone bit-homatropine (HYCODAN) 5-1.5 MG/5ML syrup      COViD 19 - discussed quarantine and restrictions. Call if not better. Ine one week. Work note provided.    Meds ordered this encounter  Medications   benzonatate (TESSALON) 200 MG capsule    Sig: Take 1 capsule (200 mg total) by mouth 2 (two) times daily as needed for cough.    Dispense:  20 capsule    Refill:  0   HYDROcodone bit-homatropine (HYCODAN) 5-1.5 MG/5ML syrup    Sig: Take  5 mLs by mouth at bedtime as needed for cough.    Dispense:  75 mL    Refill:  0    Return if symptoms worsen or fail to improve.  Beatrice Lecher, MD

## 2022-05-07 ENCOUNTER — Encounter: Payer: Self-pay | Admitting: Family Medicine

## 2022-05-07 DIAGNOSIS — U071 COVID-19: Secondary | ICD-10-CM

## 2022-05-07 DIAGNOSIS — R051 Acute cough: Secondary | ICD-10-CM

## 2022-05-07 MED ORDER — HYDROCODONE BIT-HOMATROP MBR 5-1.5 MG/5ML PO SOLN: 5.0000 mL | Freq: Every evening | ORAL | 0 refills | Status: DC | PRN

## 2022-05-07 NOTE — Telephone Encounter (Signed)
We may be able to just give the pharmacy authorization to go ahead and fill it.

## 2022-05-18 ENCOUNTER — Other Ambulatory Visit: Payer: Self-pay | Admitting: Family Medicine

## 2022-05-18 DIAGNOSIS — Z1231 Encounter for screening mammogram for malignant neoplasm of breast: Secondary | ICD-10-CM

## 2022-05-20 ENCOUNTER — Other Ambulatory Visit: Payer: Self-pay | Admitting: Family Medicine

## 2022-05-20 DIAGNOSIS — G4700 Insomnia, unspecified: Secondary | ICD-10-CM

## 2022-05-30 DIAGNOSIS — Z09 Encounter for follow-up examination after completed treatment for conditions other than malignant neoplasm: Secondary | ICD-10-CM | POA: Diagnosis not present

## 2022-05-30 DIAGNOSIS — Z1211 Encounter for screening for malignant neoplasm of colon: Secondary | ICD-10-CM | POA: Diagnosis not present

## 2022-05-30 DIAGNOSIS — K5730 Diverticulosis of large intestine without perforation or abscess without bleeding: Secondary | ICD-10-CM | POA: Diagnosis not present

## 2022-05-30 DIAGNOSIS — Z86010 Personal history of colonic polyps: Secondary | ICD-10-CM | POA: Diagnosis not present

## 2022-05-30 LAB — HM COLONOSCOPY

## 2022-05-31 ENCOUNTER — Encounter: Payer: Self-pay | Admitting: Family Medicine

## 2022-05-31 ENCOUNTER — Ambulatory Visit (INDEPENDENT_AMBULATORY_CARE_PROVIDER_SITE_OTHER): Payer: Medicare HMO | Admitting: Family Medicine

## 2022-05-31 VITALS — BP 123/66 | HR 68 | Ht 62.0 in | Wt 137.0 lb

## 2022-05-31 DIAGNOSIS — G4700 Insomnia, unspecified: Secondary | ICD-10-CM | POA: Diagnosis not present

## 2022-05-31 DIAGNOSIS — E1122 Type 2 diabetes mellitus with diabetic chronic kidney disease: Secondary | ICD-10-CM

## 2022-05-31 DIAGNOSIS — R634 Abnormal weight loss: Secondary | ICD-10-CM | POA: Diagnosis not present

## 2022-05-31 DIAGNOSIS — I10 Essential (primary) hypertension: Secondary | ICD-10-CM

## 2022-05-31 DIAGNOSIS — N1831 Chronic kidney disease, stage 3a: Secondary | ICD-10-CM | POA: Diagnosis not present

## 2022-05-31 LAB — POCT GLYCOSYLATED HEMOGLOBIN (HGB A1C): Hemoglobin A1C: 5.9 % — AB (ref 4.0–5.6)

## 2022-05-31 MED ORDER — METFORMIN HCL 1000 MG PO TABS: 1000.0000 mg | ORAL_TABLET | Freq: Two times a day (BID) | ORAL | 1 refills | Status: DC

## 2022-05-31 MED ORDER — TRAZODONE HCL 100 MG PO TABS: 100.0000 mg | ORAL_TABLET | Freq: Every day | ORAL | 3 refills | Status: DC

## 2022-05-31 NOTE — Progress Notes (Signed)
Established Patient Office Visit  Subjective   Patient ID: Bianca Mckee, female    DOB: 02-14-1951  Age: 72 y.o. MRN: [id number]  Chief Complaint  Patient presents with   Diabetes    HPI  Diabetes - no hypoglycemic events. No wounds or sores that are not healing well. No increased thirst or urination. Checking glucose at home. Taking medications as prescribed without any side effects. She really has cleaned up her diet and cut back on french fries.    Still not sleeping well - currently on trazodone.  She says a lot of nights she actually is up taking 100 mg or even 150 mg to get sleep.  She also has oxygen at home but it does not really help.  Lunesta caused a metallic taste in her mouth and the last time we tried Ambien she slept walked.  Has lost some weight. Had her coloscopy last week.    Declined vaccines.   Hypertension- Pt denies chest pain, SOB, dizziness, or heart palpitations.  Taking meds as directed w/o problems.  Denies medication side effects.    She is also concerned about recent weight loss.  She says she really has not been trying but she has been losing weight.  She has a mammogram coming up that scheduled.  She also had her vaginal exam with Dr. Juleen China at [place] and evidently they got an abnormal or questionable read on the cells and they have called her back she has been HPV positive.  Prior history of hysterectomy.  But she is not worried about it.  She is worried about the possibility of metastatic cancer.    ROS    Objective:     BP 123/66   Pulse 68   Ht '5\' 2"'$  (1.575 m)   Wt 137 lb (62.1 kg)   SpO2 97%   BMI 25.06 kg/m    Physical Exam Vitals and nursing note reviewed.  Constitutional:      Appearance: She is well-developed.  HENT:     Head: Normocephalic and atraumatic.  Cardiovascular:     Rate and Rhythm: Normal rate and regular rhythm.     Heart sounds: Normal heart sounds.  Pulmonary:     Effort: Pulmonary effort is normal.      Breath sounds: Normal breath sounds.  Skin:    General: Skin is warm and dry.  Neurological:     Mental Status: She is alert and oriented to person, place, and time.  Psychiatric:        Behavior: Behavior normal.      Results for orders placed or performed in visit on 05/31/22  POCT glycosylated hemoglobin (Hb A1C)  Result Value Ref Range   Hemoglobin A1C 5.9 (A) 4.0 - 5.6 %   HbA1c POC (<> result, manual entry)     HbA1c, POC (prediabetic range)     HbA1c, POC (controlled diabetic range)        The 10-year ASCVD risk score (Arnett DK, et al., 2019) is: 22.7%    Assessment & Plan:   Problem List Items Addressed This Visit       Cardiovascular and Mediastinum   HYPERTENSION, BENIGN SYSTEMIC    Well controlled. Continue current regimen. Follow up in  60mo      Relevant Orders   COMPLETE METABOLIC PANEL WITH GFR   CBC with Differential/Platelet   TSH     Endocrine   Type 2 diabetes mellitus with stage 3a chronic kidney disease, without  long-term current use of insulin (HCC) - Primary    A1c looks absolutely phenomenal today.  She has done a fantastic job and adjusting her diet.  Continue with metformin for now.  Follow-up in 4 months.      Relevant Medications   metFORMIN (GLUCOPHAGE) 1000 MG tablet   Other Relevant Orders   POCT glycosylated hemoglobin (Hb A1C) (Completed)   COMPLETE METABOLIC PANEL WITH GFR   CBC with Differential/Platelet   TSH     Other   INSOMNIA NOS    Will increase trazodone to 100 mg.  She is already on a very high dose of sertraline so we will need to monitor for serotonin syndrome.  Encouraged her to call if she notices any unusual symptoms.      Relevant Medications   traZODone (DESYREL) 100 MG tablet   Other Visit Diagnoses     Abnormal weight loss       Relevant Orders   COMPLETE METABOLIC PANEL WITH GFR   CBC with Differential/Platelet   TSH      Abnormal weight loss-I suspect that her drop in her weight was probably  really from cleaning up her diet which she did to help reduce her glucose levels.  I am not highly suspicious of a malignant cause.  Though she is scheduled for her mammogram coming up and then has a repeat vaginal cuff sampling for April.  Her colonoscopy is up-to-date.  We will check some labs just to rule out thyroid disorder, check for blood disorders and anemia.  Will check liver and kidney function as well.  Will continue to monitor for excessive weight loss.  Return in about 4 months (around 09/30/2022) for Diabetes follow-up.    Beatrice Lecher, MD

## 2022-05-31 NOTE — Assessment & Plan Note (Signed)
Well controlled. Continue current regimen. Follow up in  6 mo  

## 2022-05-31 NOTE — Assessment & Plan Note (Signed)
A1c looks absolutely phenomenal today.  She has done a fantastic job and adjusting her diet.  Continue with metformin for now.  Follow-up in 4 months.

## 2022-05-31 NOTE — Assessment & Plan Note (Signed)
Will increase trazodone to 100 mg.  She is already on a very high dose of sertraline so we will need to monitor for serotonin syndrome.  Encouraged her to call if she notices any unusual symptoms.

## 2022-06-01 NOTE — Progress Notes (Signed)
Hi Bianca Mckee, hemoglobin dropped to 10 from 7 months ago.  Will call the lab and see if we can add an iron panel and B12 and folate.  Are you noticing any excess blood or bleeding in the stools etc.?  The other labs look great.  Thyroid is normal

## 2022-06-05 LAB — CBC WITH DIFFERENTIAL/PLATELET
Absolute Monocytes: 499 cells/uL (ref 200–950)
Basophils Absolute: 38 cells/uL (ref 0–200)
Basophils Relative: 0.6 %
Eosinophils Absolute: 288 cells/uL (ref 15–500)
Eosinophils Relative: 4.5 %
HCT: 32.2 % — ABNORMAL LOW (ref 35.0–45.0)
Hemoglobin: 10.6 g/dL — ABNORMAL LOW (ref 11.7–15.5)
Lymphs Abs: 1805 cells/uL (ref 850–3900)
MCH: 30.2 pg (ref 27.0–33.0)
MCHC: 32.9 g/dL (ref 32.0–36.0)
MCV: 91.7 fL (ref 80.0–100.0)
MPV: 11.6 fL (ref 7.5–12.5)
Monocytes Relative: 7.8 %
Neutro Abs: 3770 cells/uL (ref 1500–7800)
Neutrophils Relative %: 58.9 %
Platelets: 215 10*3/uL (ref 140–400)
RBC: 3.51 10*6/uL — ABNORMAL LOW (ref 3.80–5.10)
RDW: 12.9 % (ref 11.0–15.0)
Total Lymphocyte: 28.2 %
WBC: 6.4 10*3/uL (ref 3.8–10.8)

## 2022-06-05 LAB — COMPLETE METABOLIC PANEL WITH GFR
AG Ratio: 1.9 (calc) (ref 1.0–2.5)
ALT: 6 U/L (ref 6–29)
AST: 11 U/L (ref 10–35)
Albumin: 4.1 g/dL (ref 3.6–5.1)
Alkaline phosphatase (APISO): 63 U/L (ref 37–153)
BUN: 15 mg/dL (ref 7–25)
CO2: 26 mmol/L (ref 20–32)
Calcium: 8.9 mg/dL (ref 8.6–10.4)
Chloride: 110 mmol/L (ref 98–110)
Creat: 0.94 mg/dL (ref 0.50–1.05)
Globulin: 2.2 g/dL (calc) (ref 1.9–3.7)
Glucose, Bld: 79 mg/dL (ref 65–99)
Potassium: 4.2 mmol/L (ref 3.5–5.3)
Sodium: 145 mmol/L (ref 135–146)
Total Bilirubin: 0.2 mg/dL (ref 0.2–1.2)
Total Protein: 6.3 g/dL (ref 6.1–8.1)
eGFR: 66 mL/min/{1.73_m2} (ref >=60)

## 2022-06-05 LAB — B12 AND FOLATE PANEL
Folate: 7.6 ng/mL
Vitamin B-12: 299 pg/mL (ref 200–1100)

## 2022-06-05 LAB — IRON,TIBC AND FERRITIN PANEL
%SAT: 12 % (calc) — ABNORMAL LOW (ref 16–45)
Ferritin: 37 ng/mL (ref 16–288)
Iron: 42 ug/dL — ABNORMAL LOW (ref 45–160)
TIBC: 352 mcg/dL (calc) (ref 250–450)

## 2022-06-05 LAB — TSH: TSH: 1.71 mIU/L (ref 0.40–4.50)

## 2022-06-05 NOTE — Progress Notes (Signed)
We had the lab add an iron panel and iron was low.  I like for her to's go ahead and start some over-the-counter iron starting with 1 tab daily.  It can be a little constipating so she may need to take a softener with it.  Also work on eating iron rich foods.  Recommend recheck CBC and iron panel in 3 weeks.

## 2022-06-06 ENCOUNTER — Encounter: Payer: Self-pay | Admitting: Family Medicine

## 2022-06-06 ENCOUNTER — Other Ambulatory Visit: Payer: Self-pay

## 2022-06-06 DIAGNOSIS — D509 Iron deficiency anemia, unspecified: Secondary | ICD-10-CM

## 2022-06-18 DIAGNOSIS — Z800 Family history of malignant neoplasm of digestive organs: Secondary | ICD-10-CM | POA: Diagnosis not present

## 2022-06-18 DIAGNOSIS — N891 Moderate vaginal dysplasia: Secondary | ICD-10-CM | POA: Diagnosis not present

## 2022-06-22 ENCOUNTER — Other Ambulatory Visit: Payer: Self-pay | Admitting: Family Medicine

## 2022-06-22 DIAGNOSIS — F331 Major depressive disorder, recurrent, moderate: Secondary | ICD-10-CM

## 2022-06-27 ENCOUNTER — Encounter: Payer: Self-pay | Admitting: Family Medicine

## 2022-06-27 ENCOUNTER — Ambulatory Visit (INDEPENDENT_AMBULATORY_CARE_PROVIDER_SITE_OTHER): Payer: Medicare HMO

## 2022-06-27 DIAGNOSIS — Z1231 Encounter for screening mammogram for malignant neoplasm of breast: Secondary | ICD-10-CM

## 2022-06-28 NOTE — Progress Notes (Signed)
Please call patient. Normal mammogram.  Repeat in 1 year.  

## 2022-07-01 DIAGNOSIS — B029 Zoster without complications: Secondary | ICD-10-CM | POA: Diagnosis not present

## 2022-07-02 ENCOUNTER — Encounter: Payer: Self-pay | Admitting: Family Medicine

## 2022-07-02 ENCOUNTER — Telehealth: Payer: Self-pay | Admitting: Family Medicine

## 2022-07-02 NOTE — Telephone Encounter (Signed)
Pt called stating she was seen at Urgent Care this weekend for shingles. Pt was given an antiviral medication and wanted to know if there was anything else she could do.  Phone number [phone].

## 2022-07-03 NOTE — Telephone Encounter (Signed)
There is a MyChart message about this same problem.

## 2022-07-06 ENCOUNTER — Encounter: Payer: Self-pay | Admitting: Family Medicine

## 2022-07-09 ENCOUNTER — Encounter: Payer: Self-pay | Admitting: Family Medicine

## 2022-07-09 ENCOUNTER — Ambulatory Visit (INDEPENDENT_AMBULATORY_CARE_PROVIDER_SITE_OTHER): Payer: Medicare HMO | Admitting: Family Medicine

## 2022-07-09 VITALS — BP 149/72 | HR 76 | Ht 62.0 in | Wt 134.8 lb

## 2022-07-09 DIAGNOSIS — B0229 Other postherpetic nervous system involvement: Secondary | ICD-10-CM | POA: Insufficient documentation

## 2022-07-09 DIAGNOSIS — R051 Acute cough: Secondary | ICD-10-CM | POA: Diagnosis not present

## 2022-07-09 MED ORDER — HYDROCOD POLI-CHLORPHE POLI ER 10-8 MG/5ML PO SUER: 5.0000 mL | Freq: Every evening | ORAL | 0 refills | Status: DC | PRN

## 2022-07-09 MED ORDER — GABAPENTIN 100 MG PO CAPS: ORAL_CAPSULE | ORAL | 3 refills | Status: DC

## 2022-07-09 NOTE — Assessment & Plan Note (Signed)
Secondary to shingles.  We discussed that the shingles can cause a residual nerve pain.  We will treat with gabapentin.  Told her to take 300 mg at bedtime and 100 mg in the morning.  Did write her out for work tomorrow so we can see how her body does with this medication. - We did discuss that she will still need to get shingles vaccine and a 2 dose series at the pharmacy since she has Medicare.

## 2022-07-09 NOTE — Patient Instructions (Signed)
Take gabapentin  at night  Take gabapentin 

## 2022-07-09 NOTE — Progress Notes (Signed)
Acute Office Visit  Subjective:     Patient ID: Bianca Mckee, female    DOB: [birth year], 72 y.o.   MRN: [id number]  Chief Complaint  Patient presents with   Cough    Lingering cough x since she had Covid infection in February 2024.   Herpes Zoster    Patient states was seen at urgent care. Diagnosed with Shingles. Last shingles antiviral was taken on Saturday 07/07/22. Patient still having right sided tenderness and some swelling / hardness in Right side abdomen.     HPI Patient is in today for shingles follow up. She is having residual pain.  She was treated with antiviral.  She is having a dry, nonproductive cough.  Said she had COVID back in January and has had this cough ever since.  Nothing makes it better.   Review of Systems  Constitutional:  Negative for chills and fever.  Respiratory:  Positive for cough. Negative for shortness of breath.   Cardiovascular:  Negative for chest pain.  Neurological:  Negative for headaches.        Objective:    BP (!) 149/72   Pulse 76   Ht  (1.575 m)   Wt 134 lb 12 oz (61.1 kg)   SpO2 99%   BMI 24.65 kg/m    Physical Exam Vitals and nursing note reviewed.  Constitutional:      General: She is not in acute distress.    Appearance: Normal appearance.  HENT:     Head: Normocephalic and atraumatic.     Right Ear: External ear normal.     Left Ear: External ear normal.     Nose: Nose normal.  Eyes:     Conjunctiva/sclera: Conjunctivae normal.  Cardiovascular:     Rate and Rhythm: Normal rate and regular rhythm.  Pulmonary:     Effort: Pulmonary effort is normal.     Breath sounds: Normal breath sounds.  Skin:    Comments: Rash present that is indicative of shingles.  No more vesicles are present.  Scabbing is present.  No erythema.  Neurological:     General: No focal deficit present.     Mental Status: She is alert and oriented to person, place, and time.  Psychiatric:        Mood and Affect: Mood normal.         Behavior: Behavior normal.        Thought Content: Thought content normal.        Judgment: Judgment normal.     No results found for any visits on 07/09/22.      Assessment & Plan:   Problem List Items Addressed This Visit       Nervous and Auditory   Postherpetic neuralgia - Primary    Secondary to shingles.  We discussed that the shingles can cause a residual nerve pain.  We will treat with gabapentin.  Told her to take 300 mg at bedtime and 100 mg in the morning.  Did write her out for work tomorrow so we can see how her body does with this medication. - We did discuss that she will still need to get shingles vaccine and a 2 dose series at the pharmacy since she has Medicare.      Relevant Medications   gabapentin (NEURONTIN) 100 MG capsule     Other   Acute cough    Please secondary to COVID.  Have sent in Tussionex to see if we can get better  symptom control.  If this does not improve her cough we may need to consider referral to pulmonology or PFT testing to see if there is any underlying lung disease.      Relevant Medications   chlorpheniramine-HYDROcodone (TUSSIONEX) 10-8 MG/5ML    Meds ordered this encounter  Medications   gabapentin (NEURONTIN) 100 MG capsule    Sig: Take 3 capsules by mouth nightly prn nerve pain    Dispense:  90 capsule    Refill:  3   chlorpheniramine-HYDROcodone (TUSSIONEX) 10-8 MG/5ML    Sig: Take 5 mLs by mouth at bedtime as needed for cough.    Dispense:  70 mL    Refill:  0    Return in about 2 weeks (around 07/23/2022).  Charlton Amor, DO

## 2022-07-09 NOTE — Assessment & Plan Note (Signed)
Please secondary to COVID.  Have sent in Tussionex to see if we can get better symptom control.  If this does not improve her cough we may need to consider referral to pulmonology or PFT testing to see if there is any underlying lung disease.

## 2022-07-13 ENCOUNTER — Encounter: Payer: Self-pay | Admitting: Family Medicine

## 2022-07-16 ENCOUNTER — Other Ambulatory Visit: Payer: Self-pay | Admitting: Family Medicine

## 2022-07-16 ENCOUNTER — Encounter: Payer: Self-pay | Admitting: Family Medicine

## 2022-07-16 MED ORDER — QUTENZA (2 PATCH) 8 % EX KIT: PACK | CUTANEOUS | 0 refills | Status: DC

## 2022-07-21 ENCOUNTER — Other Ambulatory Visit: Payer: Self-pay | Admitting: Family Medicine

## 2022-07-24 ENCOUNTER — Ambulatory Visit (INDEPENDENT_AMBULATORY_CARE_PROVIDER_SITE_OTHER): Payer: Medicare HMO | Admitting: Family Medicine

## 2022-07-24 ENCOUNTER — Encounter: Payer: Self-pay | Admitting: Family Medicine

## 2022-07-24 VITALS — BP 127/79 | HR 87 | Ht 63.0 in | Wt 135.8 lb

## 2022-07-24 DIAGNOSIS — B0229 Other postherpetic nervous system involvement: Secondary | ICD-10-CM | POA: Diagnosis not present

## 2022-07-24 DIAGNOSIS — D509 Iron deficiency anemia, unspecified: Secondary | ICD-10-CM | POA: Diagnosis not present

## 2022-07-24 NOTE — Assessment & Plan Note (Signed)
-   continue to use gabapentin nightly prn for pain  - pt notes feeling swollen, recommend ice vs heat

## 2022-07-24 NOTE — Progress Notes (Signed)
Established patient visit   Patient: Bianca Mckee   DOB: [birth year]   72 y.o. Female  MRN: [id number] Visit Date: 07/24/2022  Today's healthcare provider: Charlton Amor, DO   Chief Complaint  Patient presents with   Follow-up    Shingles and cough f/u- patient states shingles sores have gone but still having some  nerve pain , numbness and swelling  R flank into R abdomen. Cough is better but will still occasionally get a tickle that will cause coughing.     SUBJECTIVE    Chief Complaint  Patient presents with   Follow-up    Shingles and cough f/u- patient states shingles sores have gone but still having some  nerve pain , numbness and swelling  R flank into R abdomen. Cough is better but will still occasionally get a tickle that will cause coughing.    HPI  Pt presents for follow up on the shingles. She notes the rash is better but she is still having some nerve pain and tenderness. She was given gabapentin and has noted improvement with this.  Review of Systems  Constitutional:  Negative for activity change, fatigue and fever.  Respiratory:  Negative for cough and shortness of breath.   Cardiovascular:  Negative for chest pain.  Gastrointestinal:  Negative for abdominal pain.  Genitourinary:  Negative for difficulty urinating.       Current Meds  Medication Sig   Accu-Chek Softclix Lancets lancets    ALPRAZolam (XANAX) 1 MG tablet TAKE 1/2-1 TABLET BY MOUTH DAILY AS NEEDED FOR ANXIETY   atorvastatin (LIPITOR) 40 MG tablet TAKE 1 TABLET BY MOUTH EVERYDAY AT BEDTIME   blood glucose meter kit and supplies Dispense based on patient and insurance preference. Use to test blood sugar once daily. Dx: E11.9   capsaicin topical system (QUTENZA, 2 PATCH,) 8 % Apply 1 patch to most painful area for once. Can apply a max of 4 patches to that area at one time only. No more than one use.   chlorpheniramine-HYDROcodone (TUSSIONEX) 10-8 MG/5ML Take 5 mLs by mouth at bedtime  as needed for cough.   gabapentin (NEURONTIN) 100 MG capsule Take 3 capsules by mouth nightly prn nerve pain   glucose blood (ACCU-CHEK GUIDE) test strip USE DAILY AS DIRECTED   hydrochlorothiazide (HYDRODIURIL) 25 MG tablet TAKE 1 TABLET BY MOUTH EVERY DAY   metFORMIN (GLUCOPHAGE) 1000 MG tablet Take 1 tablet (1,000 mg total) by mouth 2 (two) times daily with a meal.   sertraline (ZOLOFT) 100 MG tablet TAKE 1.5 TABLETS (150MG  TOTAL) BY MOUTH DAILY   telmisartan (MICARDIS) 20 MG tablet TAKE 1 TABLET BY MOUTH EVERY DAY   traZODone (DESYREL) 100 MG tablet Take 1-1.5 tablets (100-150 mg total) by mouth at bedtime.    OBJECTIVE    BP 127/79   Pulse 87   Ht 5\' 3"  (1.6 m)   Wt 135 lb 12 oz (61.6 kg)   SpO2 98%   BMI 24.05 kg/m   Physical Exam Vitals and nursing note reviewed.  Constitutional:      General: She is not in acute distress.    Appearance: Normal appearance.  HENT:     Head: Normocephalic and atraumatic.     Right Ear: External ear normal.     Left Ear: External ear normal.     Nose: Nose normal.  Eyes:     Conjunctiva/sclera: Conjunctivae normal.  Cardiovascular:     Rate and Rhythm: Normal rate and  regular rhythm.  Pulmonary:     Effort: Pulmonary effort is normal.     Breath sounds: Normal breath sounds.  Skin:    Comments: Rash is gone. Some numbness to palpation of right oblique area  Neurological:     General: No focal deficit present.     Mental Status: She is alert and oriented to person, place, and time.  Psychiatric:        Mood and Affect: Mood normal.        Behavior: Behavior normal.        Thought Content: Thought content normal.        Judgment: Judgment normal.       ASSESSMENT & PLAN    Problem List Items Addressed This Visit       Nervous and Auditory   Postherpetic neuralgia - Primary    - continue to use gabapentin nightly prn for pain  - pt notes feeling swollen, recommend ice vs heat              No orders of the defined  types were placed in this encounter.   No orders of the defined types were placed in this encounter.    Charlton Amor, DO  [facility] at [facility] [phone] (phone) [phone] (fax)  [facility]

## 2022-07-25 ENCOUNTER — Encounter: Payer: Self-pay | Admitting: Family Medicine

## 2022-07-25 LAB — CBC WITH DIFFERENTIAL/PLATELET
Absolute Monocytes: 616 cells/uL (ref 200–950)
Basophils Absolute: 87 cells/uL (ref 0–200)
Basophils Relative: 1.1 %
Eosinophils Absolute: 269 cells/uL (ref 15–500)
Eosinophils Relative: 3.4 %
HCT: 37.4 % (ref 35.0–45.0)
Hemoglobin: 12.4 g/dL (ref 11.7–15.5)
Lymphs Abs: 2370 cells/uL (ref 850–3900)
MCH: 31.6 pg (ref 27.0–33.0)
MCHC: 33.2 g/dL (ref 32.0–36.0)
MCV: 95.2 fL (ref 80.0–100.0)
MPV: 11.4 fL (ref 7.5–12.5)
Monocytes Relative: 7.8 %
Neutro Abs: 4558 cells/uL (ref 1500–7800)
Neutrophils Relative %: 57.7 %
Platelets: 311 10*3/uL (ref 140–400)
RBC: 3.93 10*6/uL (ref 3.80–5.10)
RDW: 12.4 % (ref 11.0–15.0)
Total Lymphocyte: 30 %
WBC: 7.9 10*3/uL (ref 3.8–10.8)

## 2022-07-25 LAB — IRON,TIBC AND FERRITIN PANEL
%SAT: 37 % (calc) (ref 16–45)
Ferritin: 35 ng/mL (ref 16–288)
Iron: 137 ug/dL (ref 45–160)
TIBC: 375 mcg/dL (calc) (ref 250–450)

## 2022-07-25 NOTE — Progress Notes (Signed)
Hi Mashonda, hemoglobin looks much better it is up to 12.4 which is great.  Iron levels have improved which is fantastic.  I would recommend continuing the extra iron for about 3 more months so probably end about August and then at that point you can stop.

## 2022-07-26 ENCOUNTER — Other Ambulatory Visit: Payer: Self-pay | Admitting: Family Medicine

## 2022-07-26 DIAGNOSIS — G4700 Insomnia, unspecified: Secondary | ICD-10-CM

## 2022-07-30 DIAGNOSIS — H2513 Age-related nuclear cataract, bilateral: Secondary | ICD-10-CM | POA: Diagnosis not present

## 2022-07-30 DIAGNOSIS — E119 Type 2 diabetes mellitus without complications: Secondary | ICD-10-CM | POA: Diagnosis not present

## 2022-07-30 DIAGNOSIS — H25013 Cortical age-related cataract, bilateral: Secondary | ICD-10-CM | POA: Diagnosis not present

## 2022-07-30 DIAGNOSIS — H43813 Vitreous degeneration, bilateral: Secondary | ICD-10-CM | POA: Diagnosis not present

## 2022-07-30 DIAGNOSIS — H35363 Drusen (degenerative) of macula, bilateral: Secondary | ICD-10-CM | POA: Diagnosis not present

## 2022-07-30 DIAGNOSIS — H35033 Hypertensive retinopathy, bilateral: Secondary | ICD-10-CM | POA: Diagnosis not present

## 2022-07-30 DIAGNOSIS — H52223 Regular astigmatism, bilateral: Secondary | ICD-10-CM | POA: Diagnosis not present

## 2022-07-30 DIAGNOSIS — D3131 Benign neoplasm of right choroid: Secondary | ICD-10-CM | POA: Diagnosis not present

## 2022-07-30 LAB — HM DIABETES EYE EXAM

## 2022-08-08 ENCOUNTER — Ambulatory Visit (INDEPENDENT_AMBULATORY_CARE_PROVIDER_SITE_OTHER): Payer: Medicare HMO | Admitting: Family Medicine

## 2022-08-08 DIAGNOSIS — Z0000 Encounter for general adult medical examination without abnormal findings: Secondary | ICD-10-CM | POA: Diagnosis not present

## 2022-08-08 DIAGNOSIS — Z780 Asymptomatic menopausal state: Secondary | ICD-10-CM

## 2022-08-08 NOTE — Progress Notes (Signed)
MEDICARE ANNUAL WELLNESS VISIT  08/08/2022  Telephone Visit Disclaimer This Medicare AWV was conducted by telephone due to national recommendations for restrictions regarding the COVID-19 Pandemic (e.g. social distancing).  I verified, using two identifiers, that I am speaking with Bianca Mckee or their authorized healthcare agent. I discussed the limitations, risks, security, and privacy concerns of performing an evaluation and management service by telephone and the potential availability of an in-person appointment in the future. The patient expressed understanding and agreed to proceed.  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  Subjective:    Bianca Mckee is a 72 y.o. female patient of Metheney, Barbarann Ehlers, MD who had a Medicare Annual Wellness Visit today via telephone. Bianca Mckee is Working full time and lives alone. she has 1 child. she reports that she is socially active and does interact with friends/family regularly. she is moderately physically active and enjoys reading, walking and being outdoors.  Patient Care Team: Bianca Games, MD as PCP - General Bianca Pavy, MD as Referring Physician (Nephrology) Dr. Darcey Mckee  (Gynecology)     08/08/2022    8:07 AM 07/19/2021   10:46 AM 07/08/2020    2:14 PM 06/07/2020    1:27 PM 12/02/2018    3:08 PM  Advanced Directives  Does Patient Have a Medical Advance Directive? No No No No No  Would patient like information on creating a medical advance directive? No - Patient declined No - Patient declined No - Patient declined No - Patient declined No - Patient declined    Hospital Utilization Over the Past 12 Months: # of hospitalizations or ER visits: 0 # of surgeries: 1  Review of Systems    Patient reports that her overall health is better compared to last year.  History obtained from chart review and the patient  Patient Reported Readings (BP, Pulse, CBG, Weight,  etc) none  Pain Assessment Pain : No/denies pain     Current Medications & Allergies (verified) Allergies as of 08/08/2022       Reactions   Ambien [zolpidem Tartrate] Other (See Comments)   Sleep walking, injury   Codeine Phosphate    Eszopiclone Other (See Comments)   Metallic taste   Prednisone Other (See Comments)   Increased BP        Medication List        Accurate as of Aug 08, 2022  8:21 AM. If you have any questions, ask your nurse or doctor.          Accu-Chek Guide test strip Generic drug: glucose blood USE DAILY AS DIRECTED   Accu-Chek Softclix Lancets lancets   ALPRAZolam 1 MG tablet Commonly known as: XANAX TAKE 1/2-1 TABLET BY MOUTH DAILY AS NEEDED FOR ANXIETY   atorvastatin 40 MG tablet Commonly known as: LIPITOR TAKE 1 TABLET BY MOUTH EVERYDAY AT BEDTIME   blood glucose meter kit and supplies Dispense based on patient and insurance preference. Use to test blood sugar once daily. Dx: E11.9   chlorpheniramine-HYDROcodone 10-8 MG/5ML Commonly known as: TUSSIONEX Take 5 mLs by mouth at bedtime as needed for cough.   gabapentin 100 MG capsule Commonly known as: NEURONTIN Take 3 capsules by mouth nightly prn nerve pain   hydrochlorothiazide 25 MG tablet Commonly known as: HYDRODIURIL TAKE 1 TABLET BY MOUTH EVERY DAY   metFORMIN 1000 MG tablet Commonly known as: GLUCOPHAGE Take 1 tablet (1,000 mg total) by mouth 2 (two) times daily with  a meal.   Qutenza (2 Patch) 8 % Generic drug: capsaicin topical system Apply 1 patch to most painful area for once. Can apply a max of 4 patches to that area at one time only. No more than one use.   sertraline 100 MG tablet Commonly known as: ZOLOFT TAKE 1.5 TABLETS (150MG  TOTAL) BY MOUTH DAILY   telmisartan 20 MG tablet Commonly known as: MICARDIS TAKE 1 TABLET BY MOUTH EVERY DAY   traZODone 100 MG tablet Commonly known as: DESYREL TAKE 1-1.5 TABLETS (100-150 MG TOTAL) BY MOUTH AT  BEDTIME.        History (reviewed): Past Medical History:  Diagnosis Date   Anxiety    Arthritis 2016   Depression 1996   Diabetes mellitus without complication (HCC)    GERD (gastroesophageal reflux disease)    Hypertension 1996   Neuromuscular disorder (HCC) 4-15   Shingles   Vaginal Pap smear, abnormal    Past Surgical History:  Procedure Laterality Date   ABDOMINAL HYSTERECTOMY  2020   ARTHROPLASTY Left 2022   left knee   CERVIX LESION DESTRUCTION     JOINT REPLACEMENT  2017&2022   thumb surgery Right 09/13/2021   TUBAL LIGATION     Family History  Problem Relation Age of Onset   Lung cancer Brother 25       smoker   Pancreatic cancer Brother    Pancreatic cancer Brother    Lung cancer Brother    Diabetes Mother    Arthritis Father    Hypertension Father    ADD / ADHD Son    Social History   Socioeconomic History   Marital status: Divorced    Spouse name: Not on file   Number of children: 1   Years of education: 12   Highest education level: 12th grade  Occupational History   Occupation: Full time   Tobacco Use   Smoking status: Never   Smokeless tobacco: Never  Vaping Use   Vaping Use: Never used  Substance and Sexual Activity   Alcohol use: Yes    Alcohol/week: 1.0 standard drink of alcohol    Types: 1 Shots of liquor per week    Comment: occasionally   Drug use: No   Sexual activity: Yes    Birth control/protection: Post-menopausal, None  Other Topics Concern   Not on file  Social History Narrative   Still working at [employer] in [place]. Lives alone. She enjoys watching t.v.    Social Determinants of Health   Financial Resource Strain: Low Risk  (08/08/2022)   Overall Financial Resource Strain (CARDIA)    Difficulty of Paying Living Expenses: Not hard at all  Food Insecurity: No Food Insecurity (08/08/2022)   Hunger Vital Sign    Worried About Running Out of Food in the Last Year: Never true    Ran Out of Food in the Last Year:  Never true  Transportation Needs: No Transportation Needs (08/08/2022)   PRAPARE - Administrator, Civil Service (Medical): No    Lack of Transportation (Non-Medical): No  Physical Activity: Sufficiently Active (08/08/2022)   Exercise Vital Sign    Days of Exercise per Week: 3 days    Minutes of Exercise per Session: 50 min  Stress: Stress Concern Present (08/08/2022)   [employer] of Occupational Health - Occupational Stress Questionnaire    Feeling of Stress : To some extent  Social Connections: Socially Isolated (08/08/2022)   Social Connection and Isolation Panel [NHANES]  Frequency of Communication with Friends and Family: More than three times a week    Frequency of Social Gatherings with Friends and Family: More than three times a week    Attends Religious Services: Never    Database administrator or Organizations: No    Attends Banker Meetings: Never    Marital Status: Divorced    Activities of Daily Living    08/08/2022    8:13 AM 07/23/2022    8:16 AM  In your present state of health, do you have any difficulty performing the following activities:  Hearing? 0 0  Vision? 0 0  Difficulty concentrating or making decisions? 1 1  Comment some short term memory loss   Walking or climbing stairs? 0 0  Dressing or bathing? 0 0  Doing errands, shopping? 0 0  Preparing Food and eating ? N N  Using the Toilet? N N  In the past six months, have you accidently leaked urine? N N  Do you have problems with loss of bowel control? N N  Managing your Medications? N N  Managing your Finances? N N  Housekeeping or managing your Housekeeping? N N    Patient Education/ Literacy How often do you need to have someone help you when you read instructions, pamphlets, or other written materials from your doctor or pharmacy?: 1 - Never What is the last grade level you completed in school?: 12th grade  Exercise Current Exercise Habits: Home exercise routine,  Type of exercise: walking;Other - see comments (stationary bike), Time (Minutes): 50, Frequency (Times/Week): 3, Weekly Exercise (Minutes/Week): 150, Intensity: Moderate, Exercise limited by: None identified  Diet Patient reports consuming 3 small meals a day and 0-1 snack(s) a day Patient reports that her primary diet is: Regular Patient reports that she does have regular access to food.   Depression Screen    08/08/2022    8:10 AM 07/24/2022    1:06 PM 05/31/2022    3:35 PM 02/22/2022    3:20 PM 07/19/2021   10:47 AM 04/10/2021    1:30 PM 01/05/2021    3:57 PM  PHQ 2/9 Scores  PHQ - 2 Score 0 0 2 0 1 0 2  PHQ- 9 Score   9  1  6      Fall Risk    08/08/2022    8:10 AM 07/24/2022    1:06 PM 07/23/2022    8:16 AM 05/31/2022    3:18 PM 02/22/2022    3:19 PM  Fall Risk   Falls in the past year? 0 0 0 1 0  Number falls in past yr: 0 0 0 0 0  Injury with Fall? 0 0 1 1 0  Risk for fall due to : History of fall(s) No Fall Risks  No Fall Risks No Fall Risks  Follow up Falls evaluation completed Falls evaluation completed  Falls evaluation completed Falls evaluation completed     Objective:  Bianca Mckee seemed alert and oriented and she participated appropriately during our telephone visit.  Blood Pressure Weight BMI  BP Readings from Last 3 Encounters:  07/24/22 127/79  07/09/22 (!) 149/72  05/31/22 123/66   Wt Readings from Last 3 Encounters:  07/24/22 135 lb 12 oz (61.6 kg)  07/09/22 134 lb 12 oz (61.1 kg)  05/31/22 137 lb (62.1 kg)   BMI Readings from Last 1 Encounters:  07/24/22 24.05 kg/m    *Unable to obtain current vital signs, weight, and BMI due to telephone  visit type  Hearing/Vision  Freda did not seem to have difficulty with hearing/understanding during the telephone conversation Reports that she has had a formal eye exam by an eye care professional within the past year Reports that she has not had a formal hearing evaluation within the past year *Unable to fully  assess hearing and vision during telephone visit type  Cognitive Function:    08/08/2022    8:16 AM 07/19/2021   10:54 AM 07/08/2020    2:28 PM 12/02/2018    3:14 PM  6CIT Screen  What Year? 0 points 0 points 0 points 0 points  What month? 0 points 0 points 0 points 0 points  What time? 0 points 0 points 0 points 0 points  Count back from 20 0 points 0 points 0 points 0 points  Months in reverse 0 points 0 points 0 points 0 points  Repeat phrase 4 points 0 points 0 points 0 points  Total Score 4 points 0 points 0 points 0 points   (Normal:0-7, Significant for Dysfunction: >8)  Normal Cognitive Function Screening: Yes   Immunization & Health Maintenance Record Immunization History  Administered Date(s) Administered   Fluad Quad(high Dose 65+) 02/27/2019   Influenza-Unspecified 02/27/2019   PFIZER(Purple Top)SARS-COV-2 Vaccination 05/28/2019, 06/18/2019, 12/30/2019   Pneumococcal Conjugate-13 03/09/2014   Pneumococcal Polysaccharide-23 01/06/2015, 04/12/2020   Tdap 06/14/2015, 05/01/2021   Zoster Recombinat (Shingrix) 12/11/2018   Zoster, Live 01/06/2015    Health Maintenance  Topic Date Due   OPHTHALMOLOGY EXAM  08/08/2022 (Originally 07/28/2022)   Zoster Vaccines- Shingrix (2 of 2) 08/31/2022 (Originally 02/05/2019)   COVID-19 Vaccine (4 - 2023-24 season) 05/31/2023 (Originally 11/17/2021)   Diabetic kidney evaluation - Urine ACR  10/13/2022   INFLUENZA VACCINE  10/18/2022   HEMOGLOBIN A1C  12/01/2022   DEXA SCAN  02/16/2023   Diabetic kidney evaluation - eGFR measurement  05/31/2023   FOOT EXAM  05/31/2023   MAMMOGRAM  06/27/2023   Medicare Annual Wellness (AWV)  08/08/2023   COLONOSCOPY (Pts 45-33yrs Insurance coverage will need to be confirmed)  05/30/2027   DTaP/Tdap/Td (3 - Td or Tdap) 05/02/2031   Pneumonia Vaccine 76+ Years old  Completed   Hepatitis C Screening  Completed   HPV VACCINES  Aged Out       Assessment  This is a routine wellness examination for  [employer].  Health Maintenance: Due or Overdue There are no preventive care reminders to display for this patient.   Bianca Mckee does not need a referral for [employer] Assistance: Care Management:   no Social Work:    no Prescription Assistance:  no Nutrition/Diabetes Education:  no   Plan:  Personalized Goals  Goals Addressed               This Visit's Progress     Patient Stated (pt-stated)        Patient stated that she would like to loose 10 lbs.       Personalized Health Maintenance & Screening Recommendations  Bone densitometry screening 2nd dose of shingles vaccine Diabetic eye exam- need records  Lung Cancer Screening Recommended: no (Low Dose CT Chest recommended if Age 71-80 years, 30 pack-year currently smoking OR have quit w/in past 15 years) Hepatitis C Screening recommended: no HIV Screening recommended: no  Advanced Directives: Written information was not prepared per patient's request.  Referrals & Orders Orders Placed This Encounter  Procedures   DEXAScan    Follow-up Plan Follow-up with Nani Gasser  D, MD as planned Schedule shingles vaccine at the pharmacy.  Bone density due in December. Diabetic eye exam-need records Medicare wellness visit in one year.  Patient will access AVS on my chart.   I have personally reviewed and noted the following in the patient's chart:   Medical and social history Use of alcohol, tobacco or illicit drugs  Current medications and supplements Functional ability and status Nutritional status Physical activity Advanced directives List of other physicians Hospitalizations, surgeries, and ER visits in previous 12 months Vitals Screenings to include cognitive, depression, and falls Referrals and appointments  In addition, I have reviewed and discussed with Bianca Mckee certain preventive protocols, quality metrics, and best practice recommendations. A written personalized care plan for  preventive services as well as general preventive health recommendations is available and can be mailed to the patient at her request.      Modesto Charon, RN BSN  08/08/2022

## 2022-08-08 NOTE — Patient Instructions (Addendum)
MEDICARE ANNUAL WELLNESS VISIT Health Maintenance Summary and Written Plan of Care  Bianca Mckee ,  Thank you for allowing me to perform your Medicare Annual Wellness Visit and for your ongoing commitment to your health.   Health Maintenance & Immunization History Health Maintenance  Topic Date Due   OPHTHALMOLOGY EXAM  08/08/2022 (Originally 07/28/2022)   Zoster Vaccines- Shingrix (2 of 2) 08/31/2022 (Originally 02/05/2019)   COVID-19 Vaccine (4 - 2023-24 season) 05/31/2023 (Originally 11/17/2021)   Diabetic kidney evaluation - Urine ACR  10/13/2022   INFLUENZA VACCINE  10/18/2022   HEMOGLOBIN A1C  12/01/2022   DEXA SCAN  02/16/2023   Diabetic kidney evaluation - eGFR measurement  05/31/2023   FOOT EXAM  05/31/2023   MAMMOGRAM  06/27/2023   Medicare Annual Wellness (AWV)  08/08/2023   COLONOSCOPY (Pts 45-49yrs Insurance coverage will need to be confirmed)  05/30/2027   DTaP/Tdap/Td (3 - Td or Tdap) 05/02/2031   Pneumonia Vaccine 70+ Years old  Completed   Hepatitis C Screening  Completed   HPV VACCINES  Aged Out   Immunization History  Administered Date(s) Administered   Fluad Quad(high Dose 65+) 02/27/2019   Influenza-Unspecified 02/27/2019   PFIZER(Purple Top)SARS-COV-2 Vaccination 05/28/2019, 06/18/2019, 12/30/2019   Pneumococcal Conjugate-13 03/09/2014   Pneumococcal Polysaccharide-23 01/06/2015, 04/12/2020   Tdap 06/14/2015, 05/01/2021   Zoster Recombinat (Shingrix) 12/11/2018   Zoster, Live 01/06/2015    These are the patient goals that we discussed:  Goals Addressed               This Visit's Progress     Patient Stated (pt-stated)        Patient stated that she would like to loose 10 lbs.         This is a list of Health Maintenance Items that are overdue or due now: Bone densitometry screening 2nd dose of shingles vaccine Diabetic eye exam- need records    Orders/Referrals Placed Today: Orders Placed This Encounter  Procedures   DEXAScan     Standing Status:   Future    Standing Expiration Date:   08/08/2023    Scheduling Instructions:     Please call patient to schedule.    Order Specific Question:   Reason for exam:    Answer:   post menopausal    Order Specific Question:   Preferred imaging location?    Answer:   [facility]   (Contact our referral department at [phone] if you have not spoken with someone about your referral appointment within the next 5 days)    Follow-up Plan Follow-up with Agapito Games, MD as planned Schedule shingles vaccine at the pharmacy.  Bone density due in December. Diabetic eye exam-need records Medicare wellness visit in one year.  Patient will access AVS on my chart.      Health Maintenance, Female Adopting a healthy lifestyle and getting preventive care are important in promoting health and wellness. Ask your health care provider about: The right schedule for you to have regular tests and exams. Things you can do on your own to prevent diseases and keep yourself healthy. What should I know about diet, weight, and exercise? Eat a healthy diet  Eat a diet that includes plenty of vegetables, fruits, low-fat dairy products, and lean protein. Do not eat a lot of foods that are high in solid fats, added sugars, or sodium. Maintain a healthy weight Body mass index (BMI) is used to identify weight problems. It estimates body fat based on height  and weight. Your health care provider can help determine your BMI and help you achieve or maintain a healthy weight. Get regular exercise Get regular exercise. This is one of the most important things you can do for your health. Most adults should: Exercise for at least 150 minutes each week. The exercise should increase your heart rate and make you sweat (moderate-intensity exercise). Do strengthening exercises at least twice a week. This is in addition to the moderate-intensity exercise. Spend less time sitting. Even light  physical activity can be beneficial. Watch cholesterol and blood lipids Have your blood tested for lipids and cholesterol at 70 years of age, then have this test every 5 years. Have your cholesterol levels checked more often if: Your lipid or cholesterol levels are high. You are older than 70 years of age. You are at high risk for heart disease. What should I know about cancer screening? Depending on your health history and family history, you may need to have cancer screening at various ages. This may include screening for: Breast cancer. Cervical cancer. Colorectal cancer. Skin cancer. Lung cancer. What should I know about heart disease, diabetes, and high blood pressure? Blood pressure and heart disease High blood pressure causes heart disease and increases the risk of stroke. This is more likely to develop in people who have high blood pressure readings or are overweight. Have your blood pressure checked: Every 3-5 years if you are 55-70 years of age. Every year if you are 70 years old or older. Diabetes Have regular diabetes screenings. This checks your fasting blood sugar level. Have the screening done: Once every three years after age 20 if you are at a normal weight and have a low risk for diabetes. More often and at a younger age if you are overweight or have a high risk for diabetes. What should I know about preventing infection? Hepatitis B If you have a higher risk for hepatitis B, you should be screened for this virus. Talk with your health care provider to find out if you are at risk for hepatitis B infection. Hepatitis C Testing is recommended for: Everyone born from [birth year] through 1963. Anyone with known risk factors for hepatitis C. Sexually transmitted infections (STIs) Get screened for STIs, including gonorrhea and chlamydia, if: You are sexually active and are younger than 70 years of age. You are older than 70 years of age and your health care provider tells you  that you are at risk for this type of infection. Your sexual activity has changed since you were last screened, and you are at increased risk for chlamydia or gonorrhea. Ask your health care provider if you are at risk. Ask your health care provider about whether you are at high risk for HIV. Your health care provider may recommend a prescription medicine to help prevent HIV infection. If you choose to take medicine to prevent HIV, you should first get tested for HIV. You should then be tested every 3 months for as long as you are taking the medicine. Pregnancy If you are about to stop having your period (premenopausal) and you may become pregnant, seek counseling before you get pregnant. Take 400 to 800 micrograms (mcg) of folic acid every day if you become pregnant. Ask for birth control (contraception) if you want to prevent pregnancy. Osteoporosis and menopause Osteoporosis is a disease in which the bones lose minerals and strength with aging. This can result in bone fractures. If you are 28 years old or older, or if  you are at risk for osteoporosis and fractures, ask your health care provider if you should: Be screened for bone loss. Take a calcium or vitamin D supplement to lower your risk of fractures. Be given hormone replacement therapy (HRT) to treat symptoms of menopause. Follow these instructions at home: Alcohol use Do not drink alcohol if: Your health care provider tells you not to drink. You are pregnant, may be pregnant, or are planning to become pregnant. If you drink alcohol: Limit how much you have to: 0-1 drink a day. Know how much alcohol is in your drink. In the [place] one drink equals one 12 oz bottle of beer (355 mL), one 5 oz glass of wine (148 mL), or one 1 oz glass of hard liquor (44 mL). Lifestyle Do not use any products that contain nicotine or tobacco. These products include cigarettes, chewing tobacco, and vaping devices, such as e-cigarettes. If you need help  quitting, ask your health care provider. Do not use street drugs. Do not share needles. Ask your health care provider for help if you need support or information about quitting drugs. General instructions Schedule regular health, dental, and eye exams. Stay current with your vaccines. Tell your health care provider if: You often feel depressed. You have ever been abused or do not feel safe at home. Summary Adopting a healthy lifestyle and getting preventive care are important in promoting health and wellness. Follow your health care provider's instructions about healthy diet, exercising, and getting tested or screened for diseases. Follow your health care provider's instructions on monitoring your cholesterol and blood pressure. This information is not intended to replace advice given to you by your health care provider. Make sure you discuss any questions you have with your health care provider. Document Revised: 07/25/2020 Document Reviewed: 07/25/2020 Elsevier Patient Education  2023 [employer].

## 2022-09-05 DIAGNOSIS — Z888 Allergy status to other drugs, medicaments and biological substances status: Secondary | ICD-10-CM | POA: Diagnosis not present

## 2022-09-05 DIAGNOSIS — Z791 Long term (current) use of non-steroidal anti-inflammatories (NSAID): Secondary | ICD-10-CM | POA: Diagnosis not present

## 2022-09-05 DIAGNOSIS — S20212A Contusion of left front wall of thorax, initial encounter: Secondary | ICD-10-CM | POA: Diagnosis not present

## 2022-09-05 DIAGNOSIS — R590 Localized enlarged lymph nodes: Secondary | ICD-10-CM | POA: Diagnosis not present

## 2022-09-05 DIAGNOSIS — S0083XA Contusion of other part of head, initial encounter: Secondary | ICD-10-CM | POA: Diagnosis not present

## 2022-09-05 DIAGNOSIS — S0512XA Contusion of eyeball and orbital tissues, left eye, initial encounter: Secondary | ICD-10-CM | POA: Diagnosis not present

## 2022-09-05 DIAGNOSIS — M542 Cervicalgia: Secondary | ICD-10-CM | POA: Diagnosis not present

## 2022-09-05 DIAGNOSIS — S60311A Abrasion of right thumb, initial encounter: Secondary | ICD-10-CM | POA: Diagnosis not present

## 2022-09-05 DIAGNOSIS — I10 Essential (primary) hypertension: Secondary | ICD-10-CM | POA: Diagnosis not present

## 2022-09-05 DIAGNOSIS — R071 Chest pain on breathing: Secondary | ICD-10-CM | POA: Diagnosis not present

## 2022-09-05 DIAGNOSIS — S0990XA Unspecified injury of head, initial encounter: Secondary | ICD-10-CM | POA: Diagnosis not present

## 2022-09-05 DIAGNOSIS — S299XXA Unspecified injury of thorax, initial encounter: Secondary | ICD-10-CM | POA: Diagnosis not present

## 2022-09-05 DIAGNOSIS — S6991XA Unspecified injury of right wrist, hand and finger(s), initial encounter: Secondary | ICD-10-CM | POA: Diagnosis not present

## 2022-09-05 DIAGNOSIS — S058X2A Other injuries of left eye and orbit, initial encounter: Secondary | ICD-10-CM | POA: Diagnosis not present

## 2022-09-05 DIAGNOSIS — S60811A Abrasion of right wrist, initial encounter: Secondary | ICD-10-CM | POA: Diagnosis not present

## 2022-09-05 DIAGNOSIS — Z7984 Long term (current) use of oral hypoglycemic drugs: Secondary | ICD-10-CM | POA: Diagnosis not present

## 2022-09-17 ENCOUNTER — Ambulatory Visit (INDEPENDENT_AMBULATORY_CARE_PROVIDER_SITE_OTHER): Payer: Medicare HMO | Admitting: Family Medicine

## 2022-09-17 ENCOUNTER — Encounter: Payer: Self-pay | Admitting: Family Medicine

## 2022-09-17 VITALS — BP 158/74 | HR 72 | Ht 62.0 in | Wt 133.0 lb

## 2022-09-17 DIAGNOSIS — R630 Anorexia: Secondary | ICD-10-CM

## 2022-09-17 DIAGNOSIS — R112 Nausea with vomiting, unspecified: Secondary | ICD-10-CM

## 2022-09-17 DIAGNOSIS — R590 Localized enlarged lymph nodes: Secondary | ICD-10-CM | POA: Insufficient documentation

## 2022-09-17 DIAGNOSIS — R5383 Other fatigue: Secondary | ICD-10-CM

## 2022-09-17 LAB — CBC WITH DIFFERENTIAL/PLATELET
Basophils Absolute: 62 cells/uL (ref 0–200)
Eosinophils Relative: 1.2 %
MCH: 30.8 pg (ref 27.0–33.0)
MCV: 92.4 fL (ref 80.0–100.0)
MPV: 10.9 fL (ref 7.5–12.5)
WBC: 8.9 10*3/uL (ref 3.8–10.8)

## 2022-09-17 NOTE — Progress Notes (Signed)
Established Patient Office Visit  Subjective   Patient ID: Bianca Mckee, female    DOB: December 04, 1950  Age: 72 y.o. MRN: [id number]  Chief Complaint  Patient presents with   Fall    HPI   F/U For ED visit at [facility] for a fall. Not sure what she tripped over something at work.  Has some swelling over her left eye, pain on right hand,left knee and ribs.  Been really tired and exhausted.  Having headaches since she hit her head but also had recently changed glasses so she actually switched back to her old glasses and that has helped with the headaches.  Also had a decreased appetite for several weeks even before the fall.  She was also noted to have some cervical lymphadenopathy up to 1 cm bilaterally on CT of the cervical spine that was performed in the emergency department 2 weeks ago.  He denies any abdominal pain though last night she actually vomited after 1 bite of food.  She did have COVID in February.  But no recent upper respiratory infection strep throat cold fever etc.  He is down about 3 pounds since I last saw her.   She is also no longer on gabapentin.  Everything is cleared up including the pain from the shingles so she discontinued it.    ROS    Objective:     BP (!) 158/74   Pulse 72   Ht 5\' 2"  (1.575 m)   Wt 133 lb (60.3 kg)   SpO2 98%   BMI 24.33 kg/m    Physical Exam Vitals and nursing note reviewed.  Constitutional:      Appearance: She is well-developed.  HENT:     Head: Normocephalic and atraumatic.     Right Ear: Tympanic membrane, ear canal and external ear normal.     Left Ear: Tympanic membrane, ear canal and external ear normal.     Nose: Nose normal.  Eyes:     Conjunctiva/sclera: Conjunctivae normal.     Pupils: Pupils are equal, round, and reactive to light.  Neck:     Thyroid: No thyromegaly.  Cardiovascular:     Rate and Rhythm: Normal rate and regular rhythm.     Heart sounds: Normal heart sounds.  Pulmonary:      Effort: Pulmonary effort is normal.     Breath sounds: Normal breath sounds. No wheezing.  Musculoskeletal:     Cervical back: Neck supple.  Lymphadenopathy:     Cervical: No cervical adenopathy.  Skin:    General: Skin is warm and dry.  Neurological:     Mental Status: She is alert and oriented to person, place, and time.  Psychiatric:        Behavior: Behavior normal.      No results found for any visits on 09/17/22.    The 10-year ASCVD risk score (Arnett DK, et al., 2019) is: 34.7%    Assessment & Plan:   Problem List Items Addressed This Visit       Immune and Lymphatic   Cervical lymphadenopathy   Relevant Orders   Urine Microalbumin w/creat. ratio   COMPLETE METABOLIC PANEL WITH GFR   CBC with Differential/Platelet   Lipase   US Soft Tissue Head/Neck (NON-THYROID)   Other Visit Diagnoses     Decreased appetite    -  Primary   Relevant Orders   Urine Microalbumin w/creat. ratio   COMPLETE METABOLIC PANEL WITH GFR   CBC with Differential/Platelet  Lipase   Other fatigue       Relevant Orders   Urine Microalbumin w/creat. ratio   COMPLETE METABOLIC PANEL WITH GFR   CBC with Differential/Platelet   Lipase   Nausea and vomiting, unspecified vomiting type       Relevant Orders   Urine Microalbumin w/creat. ratio   COMPLETE METABOLIC PANEL WITH GFR   CBC with Differential/Platelet   Lipase       Lymphadenopathy-recommend ultrasound for further evaluation in 1 month.  It could be secondary to COVID she is about 4 months out from that as that is been known to cause some still persistent lymphadenopathy.  But she is also had recent decreased appetite and fatigue in the last several weeks that could also be contributing will check labs including a CBC.  Nausea and vomiting-will check liver enzymes as well as check a lipase.  Still has bruising around her left eye and some tenderness over left anterior ribs.  Improving.    I spent 30 minutes on the day  of the encounter to include pre-visit record review, face-to-face time with the patient and post visit ordering of test.   Return in about 4 weeks (around 10/15/2022) for weight loss and dec appetite.    Nani Gasser, MD

## 2022-09-18 LAB — MICROALBUMIN / CREATININE URINE RATIO
Creatinine, Urine: 169 mg/dL (ref 20–275)
Microalb Creat Ratio: 22 mg/g creat (ref <30)
Microalb, Ur: 3.7 mg/dL

## 2022-09-18 LAB — CBC WITH DIFFERENTIAL/PLATELET
Absolute Monocytes: 650 {cells}/uL (ref 200–950)
Basophils Relative: 0.7 %
Eosinophils Absolute: 107 cells/uL (ref 15–500)
HCT: 36.3 % (ref 35.0–45.0)
Hemoglobin: 12.1 g/dL (ref 11.7–15.5)
Lymphs Abs: 2154 {cells}/uL (ref 850–3900)
MCHC: 33.3 g/dL (ref 32.0–36.0)
Monocytes Relative: 7.3 %
Neutro Abs: 5927 {cells}/uL (ref 1500–7800)
Neutrophils Relative %: 66.6 %
Platelets: 331 10*3/uL (ref 140–400)
RBC: 3.93 10*6/uL (ref 3.80–5.10)
RDW: 12.2 % (ref 11.0–15.0)
Total Lymphocyte: 24.2 %

## 2022-09-18 LAB — COMPLETE METABOLIC PANEL WITH GFR
AG Ratio: 1.9 (calc) (ref 1.0–2.5)
ALT: 8 U/L (ref 6–29)
AST: 15 U/L (ref 10–35)
Albumin: 4.7 g/dL (ref 3.6–5.1)
Alkaline phosphatase (APISO): 97 U/L (ref 37–153)
BUN/Creatinine Ratio: 35 (calc) — ABNORMAL HIGH (ref 6–22)
BUN: 31 mg/dL — ABNORMAL HIGH (ref 7–25)
CO2: 27 mmol/L (ref 20–32)
Calcium: 9.9 mg/dL (ref 8.6–10.4)
Chloride: 104 mmol/L (ref 98–110)
Creat: 0.88 mg/dL (ref 0.50–1.05)
Globulin: 2.5 g/dL (calc) (ref 1.9–3.7)
Glucose, Bld: 86 mg/dL (ref 65–99)
Potassium: 4.2 mmol/L (ref 3.5–5.3)
Sodium: 142 mmol/L (ref 135–146)
Total Bilirubin: 0.4 mg/dL (ref 0.2–1.2)
Total Protein: 7.2 g/dL (ref 6.1–8.1)
eGFR: 71 mL/min/{1.73_m2} (ref >=60)

## 2022-09-18 LAB — LIPASE: Lipase: 19 U/L (ref 7–60)

## 2022-09-18 NOTE — Progress Notes (Signed)
Your lab work is within acceptable range and there are no concerning findings.   ?

## 2022-09-18 NOTE — Addendum Note (Signed)
Addended by: Keno Caraway D on: 09/18/2022 06:07 PM   Modules accepted: Level of Service  

## 2022-09-25 ENCOUNTER — Other Ambulatory Visit: Payer: Self-pay | Admitting: Family Medicine

## 2022-09-25 DIAGNOSIS — G4700 Insomnia, unspecified: Secondary | ICD-10-CM

## 2022-09-28 ENCOUNTER — Ambulatory Visit: Payer: Medicare HMO | Admitting: Family Medicine

## 2022-10-03 DIAGNOSIS — Z7183 Encounter for nonprocreative genetic counseling: Secondary | ICD-10-CM | POA: Diagnosis not present

## 2022-10-03 DIAGNOSIS — Z800 Family history of malignant neoplasm of digestive organs: Secondary | ICD-10-CM | POA: Diagnosis not present

## 2022-10-12 ENCOUNTER — Ambulatory Visit (INDEPENDENT_AMBULATORY_CARE_PROVIDER_SITE_OTHER): Payer: Medicare HMO | Admitting: Family Medicine

## 2022-10-12 ENCOUNTER — Encounter: Payer: Self-pay | Admitting: Family Medicine

## 2022-10-12 ENCOUNTER — Telehealth: Payer: Self-pay | Admitting: Family Medicine

## 2022-10-12 VITALS — BP 140/70 | HR 58 | Ht 62.0 in | Wt 137.0 lb

## 2022-10-12 DIAGNOSIS — R630 Anorexia: Secondary | ICD-10-CM

## 2022-10-12 DIAGNOSIS — R590 Localized enlarged lymph nodes: Secondary | ICD-10-CM

## 2022-10-12 DIAGNOSIS — F331 Major depressive disorder, recurrent, moderate: Secondary | ICD-10-CM | POA: Diagnosis not present

## 2022-10-12 DIAGNOSIS — N1831 Chronic kidney disease, stage 3a: Secondary | ICD-10-CM

## 2022-10-12 DIAGNOSIS — E1122 Type 2 diabetes mellitus with diabetic chronic kidney disease: Secondary | ICD-10-CM

## 2022-10-12 DIAGNOSIS — I10 Essential (primary) hypertension: Secondary | ICD-10-CM | POA: Diagnosis not present

## 2022-10-12 DIAGNOSIS — R5383 Other fatigue: Secondary | ICD-10-CM

## 2022-10-12 LAB — POCT GLYCOSYLATED HEMOGLOBIN (HGB A1C): Hemoglobin A1C: 6.2 % — AB (ref 4.0–5.6)

## 2022-10-12 MED ORDER — TELMISARTAN 40 MG PO TABS: 40.0000 mg | ORAL_TABLET | Freq: Every day | ORAL | 0 refills | Status: DC

## 2022-10-12 NOTE — Assessment & Plan Note (Signed)
Pressure not well-controlled at home or here today.  Will increase to losartan to 40 mg.  New updated prescription sent to pharmacy.

## 2022-10-12 NOTE — Assessment & Plan Note (Signed)
A1c looks great today at 6.2.  Continue to work on [employer] and regular exercise.  Continue metformin.  Continue ARB and daily statin.  Lab Results  Component Value Date   HGBA1C 6.2 (A) 10/12/2022

## 2022-10-12 NOTE — Assessment & Plan Note (Signed)
Continue to follow renal function every 6 months.  Continue with some losartan.

## 2022-10-12 NOTE — Assessment & Plan Note (Signed)
Exam today which is reassuring.  I think this do think it was of a postviral related.  If any recurrence or new swollen lymph nodes please let us know.

## 2022-10-12 NOTE — Telephone Encounter (Signed)
Called patient. Left voice mail message to return our call.

## 2022-10-12 NOTE — Progress Notes (Signed)
Established Patient Office Visit  Subjective   Patient ID: ILANY TARDIE, female    DOB: 04/28/1950  Age: 72 y.o. MRN: [id number]  Chief Complaint  Patient presents with   Follow-up    Eye exam requested from [facility]  Ph: [phone] Fax:[phone]     HPI  Here for 1 month follow-up for weight loss and decreased appetite.  She was actually down about 3 pounds since she was last here.  CBC, CMP and lipase were all normal as she had also been having some nausea.  TSH a few months ago was normal.  There was no proteinuria or change in renal function. Overall she is feelng some better. Appetite is still down but she is back up on her weight.    As I last saw her she did have a genetic consultation at [facility] since she does have a family history of pancreatic cancer.  BP f/U - home BPs have been up-and-down but mostly running greater than 130.  Follow up insomnia-sleep is still fair.  She says sometimes she watches things and it just stresses her out makes it difficult for her to sleep.    ROS    Objective:     BP (!) 140/70   Pulse (!) 58   Ht 5\' 2"  (1.575 m)   Wt 137 lb (62.1 kg)   SpO2 98%   BMI 25.06 kg/m    Physical Exam Vitals and nursing note reviewed.  Constitutional:      Appearance: She is well-developed.  HENT:     Head: Normocephalic and atraumatic.  Cardiovascular:     Rate and Rhythm: Normal rate and regular rhythm.     Heart sounds: Normal heart sounds.  Pulmonary:     Effort: Pulmonary effort is normal.     Breath sounds: Normal breath sounds.  Musculoskeletal:     Cervical back: Neck supple. No tenderness.  Skin:    General: Skin is warm and dry.  Neurological:     Mental Status: She is alert and oriented to person, place, and time.  Psychiatric:        Behavior: Behavior normal.      Results for orders placed or performed in visit on 10/12/22  POCT glycosylated hemoglobin (Hb A1C)  Result Value Ref Range    Hemoglobin A1C 6.2 (A) 4.0 - 5.6 %   HbA1c POC (<> result, manual entry)     HbA1c, POC (prediabetic range)     HbA1c, POC (controlled diabetic range)        The 10-year ASCVD risk score (Arnett DK, et al., 2019) is: 28.4%    Assessment & Plan:   Problem List Items Addressed This Visit       Cardiovascular and Mediastinum   HYPERTENSION, BENIGN SYSTEMIC    Pressure not well-controlled at home or here today.  Will increase to losartan to 40 mg.  New updated prescription sent to pharmacy.      Relevant Medications   telmisartan (MICARDIS) 40 MG tablet   Other Relevant Orders   CMP14+EGFR     Endocrine   Type 2 diabetes mellitus with stage 3a chronic kidney disease, without long-term current use of insulin (HCC)    A1c looks great today at 6.2.  Continue to work on [employer] and regular exercise.  Continue metformin.  Continue ARB and daily statin.  Lab Results  Component Value Date   HGBA1C 6.2 (A) 10/12/2022  Relevant Medications   telmisartan (MICARDIS) 40 MG tablet   Other Relevant Orders   POCT glycosylated hemoglobin (Hb A1C) (Completed)     Immune and Lymphatic   Cervical lymphadenopathy    Exam today which is reassuring.  I think this do think it was of a postviral related.  If any recurrence or new swollen lymph nodes please let us know.        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  Continue with some losartan.        Other   Major depressive disorder, recurrent episode (HCC)    Stable on current dose of sertraline.  Continue current regimen follow-up in 6 months.      Other Visit Diagnoses     Decreased appetite    -  Primary   Other fatigue          Recheck for Cervical LN- resolved on exam today.    Return in about 3 weeks (around 11/02/2022) for nurse visit to recheck BP.    Nani Gasser, MD

## 2022-10-12 NOTE — Telephone Encounter (Signed)
Is call patient: I did increase her blood pressure pill.  When she starts the increased dose I will need to recheck her kidney function in about 2 weeks.  Lab order has been placed.

## 2022-10-12 NOTE — Assessment & Plan Note (Signed)
Stable on current dose of sertraline.  Continue current regimen follow-up in 6 months.

## 2022-10-15 NOTE — Telephone Encounter (Signed)
3 weeks will be fine.

## 2022-10-16 NOTE — Telephone Encounter (Signed)
Patient informed and will come in at scheduled appt 11/02/22.

## 2022-10-23 DIAGNOSIS — M79641 Pain in right hand: Secondary | ICD-10-CM | POA: Diagnosis not present

## 2022-11-02 ENCOUNTER — Other Ambulatory Visit: Payer: Self-pay | Admitting: Family Medicine

## 2022-11-02 ENCOUNTER — Ambulatory Visit: Payer: Medicare HMO

## 2022-11-02 ENCOUNTER — Ambulatory Visit (INDEPENDENT_AMBULATORY_CARE_PROVIDER_SITE_OTHER): Payer: Medicare HMO

## 2022-11-02 DIAGNOSIS — I10 Essential (primary) hypertension: Secondary | ICD-10-CM

## 2022-11-02 MED ORDER — TELMISARTAN 80 MG PO TABS: 80.0000 mg | ORAL_TABLET | Freq: Every day | ORAL | 1 refills | Status: DC

## 2022-11-02 NOTE — Progress Notes (Unsigned)
[facility] AMB POC  HPI= Patient is here for blood pressure check and labs. Denies trouble sleeping, palpitations or medication problems.  Assesment and plan- Dr. Linford Arnold increased patients telmisartan 40mg  daily to 80 mg. New prescription sent to pharmacy on file. Patient will double up on any 40mg  she has left. Will follow up for a nurse visit within 2-3 wks. Roselyn Reef, CMA

## 2022-11-03 LAB — CMP14+EGFR: Total Protein: 6.9 g/dL (ref 6.0–8.5)

## 2022-11-05 NOTE — Progress Notes (Signed)
Your lab work is within acceptable range and there are no concerning findings.   ?

## 2022-11-06 ENCOUNTER — Encounter: Payer: Self-pay | Admitting: Family Medicine

## 2022-11-23 ENCOUNTER — Ambulatory Visit (INDEPENDENT_AMBULATORY_CARE_PROVIDER_SITE_OTHER): Payer: Medicare HMO | Admitting: Family Medicine

## 2022-11-23 VITALS — BP 129/73 | HR 66 | Ht 62.0 in

## 2022-11-23 DIAGNOSIS — I10 Essential (primary) hypertension: Secondary | ICD-10-CM

## 2022-11-23 MED ORDER — AMLODIPINE BESYLATE 5 MG PO TABS: 5.0000 mg | ORAL_TABLET | Freq: Every day | ORAL | 3 refills | Status: DC

## 2022-11-23 NOTE — Progress Notes (Signed)
   Established Patient Office Visit  Subjective   Patient ID: Bianca Mckee, female    DOB: 07-22-1950  Age: 72 y.o. MRN: [id number]  Chief Complaint  Patient presents with   Hypertension    BP check nurse visit.     HPI  Hypertension-BP check. Patient denies chest pain, palpitations, shortness of breath, dizziness or medication problems.   ROS    Objective:     BP 129/73   Pulse 66   Ht 5\' 2"  (1.575 m)   SpO2 100%   BMI 25.06 kg/m    Physical Exam   No results found for any visits on 11/23/22.    The 10-year ASCVD risk score (Arnett DK, et al., 2019) is: 24.7%    Assessment & Plan:   BP check nurse visit. Initial reading = 139/61 and second reading = 129/73.  Patient is agreeable to starting amlodipine 5mg  once per day as suggested by Dr. Linford Arnold.Requesting be sent to  [facility] [address]  [place]. Has upcoming appt schld for 02/11/2023. Problem List Items Addressed This Visit       Cardiovascular and Mediastinum   HYPERTENSION, BENIGN SYSTEMIC - Primary    Return for upcoming appt schld for 02/11/2023.    Elizabeth Palau, LPN

## 2022-11-23 NOTE — Progress Notes (Signed)
Reports blood pressures have been up-and-down but nothing that is been low per se.  We discussed options.  Start amlodipine 5 mg to lower blood pressure by about 5-10 points and see if this helps with some of the spikes she asked been experiencing she is also had episodes where her diastolic pressure has jumped up over 100 so this should help with that as well.  Meds ordered this encounter  Medications   amLODipine (NORVASC) 5 MG tablet    Sig: Take 1 tablet (5 mg total) by mouth daily.    Dispense:  90 tablet    Refill:  3

## 2022-11-23 NOTE — Patient Instructions (Signed)
upcoming appt schld for 02/11/2023

## 2022-12-06 ENCOUNTER — Encounter: Payer: Self-pay | Admitting: Family Medicine

## 2022-12-19 ENCOUNTER — Ambulatory Visit: Payer: Medicare HMO | Admitting: Family Medicine

## 2022-12-22 ENCOUNTER — Other Ambulatory Visit: Payer: Self-pay | Admitting: Family Medicine

## 2022-12-22 DIAGNOSIS — F331 Major depressive disorder, recurrent, moderate: Secondary | ICD-10-CM

## 2022-12-24 ENCOUNTER — Encounter: Payer: Self-pay | Admitting: Family Medicine

## 2022-12-24 ENCOUNTER — Ambulatory Visit (INDEPENDENT_AMBULATORY_CARE_PROVIDER_SITE_OTHER): Payer: Medicare HMO | Admitting: Family Medicine

## 2022-12-24 VITALS — BP 126/68 | HR 75 | Ht 62.0 in | Wt 140.0 lb

## 2022-12-24 DIAGNOSIS — L821 Other seborrheic keratosis: Secondary | ICD-10-CM

## 2022-12-24 DIAGNOSIS — I10 Essential (primary) hypertension: Secondary | ICD-10-CM | POA: Diagnosis not present

## 2022-12-24 MED ORDER — TRETINOIN 0.01 % EX GEL: Freq: Two times a day (BID) | CUTANEOUS | 0 refills | Status: DC

## 2022-12-24 NOTE — Progress Notes (Signed)
Established Patient Office Visit  Subjective   Patient ID: Bianca Mckee, female    DOB: 02/24/1951  Age: 72 y.o. MRN: [id number]  No chief complaint on file.   HPI  Some skin lesions that she would like me look at today 1 is on the midfoot on the right on the bottom.  She has been wearing a corn/callus remover and when she took it off it did remove some of the dead skin.  She also has a lesion on her left posterior heel that seems to be growing and getting a little thicker and then standing up from the skin and started rubbing her shoe.  She also has a little brown lesion on her left facial cheek that she would like to look at today.    ROS    Objective:     BP 126/68   Pulse 75   Ht 5\' 2"  (1.575 m)   Wt 140 lb (63.5 kg)   SpO2 96%   BMI 25.61 kg/m    Physical Exam Vitals and nursing note reviewed.  Constitutional:      Appearance: Normal appearance.  HENT:     Head: Normocephalic and atraumatic.  Eyes:     Conjunctiva/sclera: Conjunctivae normal.  Cardiovascular:     Rate and Rhythm: Normal rate and regular rhythm.  Pulmonary:     Effort: Pulmonary effort is normal.     Breath sounds: Normal breath sounds.  Skin:    General: Skin is warm and dry.  Neurological:     Mental Status: She is alert.  Psychiatric:        Mood and Affect: Mood normal.      No results found for any visits on 12/24/22.    The 10-year ASCVD risk score (Arnett DK, et al., 2019) is: 23.7%    Assessment & Plan:   Problem List Items Addressed This Visit       Cardiovascular and Mediastinum   HYPERTENSION, BENIGN SYSTEMIC    Well controlled. Continue current regimen. Follow up in  6       Other Visit Diagnoses     Seborrheic keratoses    -  Primary   Relevant Medications   tretinoin (RETIN-A) 0.01 % gel      Seborrheic keratoses-I did take a blade and pare down the 1 on her heel to make it more comfortable.  The 1 on the midfoot we discussed maybe even freezing but  she says it is doing a little better now and so we will just keep an eye on it still a little bit tender from the topical she was using so she wants to hold off on any additional treatment.  She also wanted to know if there was a topical option there is some data with using tretinoin when.  I think it is reasonable to try further a month or 2 to see if it is helpful if not then recommend come in for additional cryotherapy.  I spent 20 minutes on the day of the encounter to include pre-visit record review, face-to-face time with the patient and post visit ordering of test.   No follow-ups on file.    Nani Gasser, MD

## 2022-12-24 NOTE — Assessment & Plan Note (Signed)
Well controlled. Continue current regimen. Follow up in  6

## 2023-01-01 DIAGNOSIS — M79644 Pain in right finger(s): Secondary | ICD-10-CM | POA: Diagnosis not present

## 2023-01-10 ENCOUNTER — Other Ambulatory Visit: Payer: Self-pay | Admitting: Family Medicine

## 2023-01-10 DIAGNOSIS — E785 Hyperlipidemia, unspecified: Secondary | ICD-10-CM

## 2023-01-10 DIAGNOSIS — N1831 Chronic kidney disease, stage 3a: Secondary | ICD-10-CM

## 2023-01-10 DIAGNOSIS — E1122 Type 2 diabetes mellitus with diabetic chronic kidney disease: Secondary | ICD-10-CM

## 2023-01-10 DIAGNOSIS — G4700 Insomnia, unspecified: Secondary | ICD-10-CM

## 2023-01-10 DIAGNOSIS — E119 Type 2 diabetes mellitus without complications: Secondary | ICD-10-CM

## 2023-01-11 ENCOUNTER — Other Ambulatory Visit: Payer: Self-pay | Admitting: Family Medicine

## 2023-02-11 ENCOUNTER — Ambulatory Visit: Payer: Medicare HMO | Admitting: Family Medicine

## 2023-03-07 ENCOUNTER — Ambulatory Visit: Payer: Medicare HMO | Admitting: Family Medicine

## 2023-03-14 ENCOUNTER — Ambulatory Visit: Payer: Medicare HMO | Admitting: Family Medicine

## 2023-03-14 ENCOUNTER — Encounter: Payer: Self-pay | Admitting: Family Medicine

## 2023-03-14 ENCOUNTER — Other Ambulatory Visit: Payer: Self-pay | Admitting: Family Medicine

## 2023-03-14 VITALS — BP 136/68 | HR 58 | Ht 62.0 in | Wt 142.0 lb

## 2023-03-14 DIAGNOSIS — N1831 Chronic kidney disease, stage 3a: Secondary | ICD-10-CM

## 2023-03-14 DIAGNOSIS — I10 Essential (primary) hypertension: Secondary | ICD-10-CM

## 2023-03-14 DIAGNOSIS — Z7984 Long term (current) use of oral hypoglycemic drugs: Secondary | ICD-10-CM

## 2023-03-14 DIAGNOSIS — L821 Other seborrheic keratosis: Secondary | ICD-10-CM

## 2023-03-14 DIAGNOSIS — E1122 Type 2 diabetes mellitus with diabetic chronic kidney disease: Secondary | ICD-10-CM | POA: Diagnosis not present

## 2023-03-14 DIAGNOSIS — F331 Major depressive disorder, recurrent, moderate: Secondary | ICD-10-CM

## 2023-03-14 LAB — POCT GLYCOSYLATED HEMOGLOBIN (HGB A1C): Hemoglobin A1C: 6 % — AB (ref 4.0–5.6)

## 2023-03-14 MED ORDER — TAZAROTENE 0.05 % EX CREA
TOPICAL_CREAM | Freq: Every day | CUTANEOUS | 2 refills | Status: DC
Start: 2023-03-14 — End: 2023-03-22

## 2023-03-14 NOTE — Progress Notes (Signed)
Established Patient Office Visit  Subjective  Patient ID: Bianca Mckee, female    DOB: 1950-05-31  Age: 72 y.o. MRN: [id number]  Chief Complaint  Patient presents with   Diabetes   Hypertension    HPI  Hypertension- Pt denies chest pain, SOB, dizziness, or heart palpitations.  Taking meds as directed w/o problems.  Denies medication side effects.    Diabetes - no hypoglycemic events. No wounds or sores that are not healing well. No increased thirst or urination. Checking glucose at home. Taking medications as prescribed without any side effects.  And losing weight without any specific reason previously and was concerned about it but since I last saw her she is actually been gaining weight on her home scale she had actually gotten down to 129 but she is now back up to 142.  She does not think anything is necessarily changed with her diet or activity level.  Flowsheet Row Office Visit from 03/14/2023 in [facility] at [facility]  PHQ-9 Total Score 6       Says that the cream made originally prescribed for the crusty lesions around her ankles and feet was not covered by insurance.     ROS    Objective:     BP 136/68 (BP Location: Left Arm, Cuff Size: Normal)   Pulse (!) 58   Ht 5\' 2"  (1.575 m)   Wt 142 lb (64.4 kg)   SpO2 100%   BMI 25.97 kg/m    Physical Exam Vitals and nursing note reviewed.  Constitutional:      Appearance: Normal appearance.  HENT:     Head: Normocephalic and atraumatic.  Eyes:     Conjunctiva/sclera: Conjunctivae normal.  Cardiovascular:     Rate and Rhythm: Normal rate and regular rhythm.  Pulmonary:     Effort: Pulmonary effort is normal.     Breath sounds: Normal breath sounds.  Skin:    General: Skin is warm and dry.  Neurological:     Mental Status: She is alert.  Psychiatric:        Mood and Affect: Mood normal.      Results for orders placed or performed in visit on 03/14/23   POCT HgB A1C  Result Value Ref Range   Hemoglobin A1C 6.0 (A) 4.0 - 5.6 %   HbA1c POC (<> result, manual entry)     HbA1c, POC (prediabetic range)     HbA1c, POC (controlled diabetic range)        The 10-year ASCVD risk score (Arnett DK, et al., 2019) is: 29.1%    Assessment & Plan:   Problem List Items Addressed This Visit       Cardiovascular and Mediastinum   HYPERTENSION, BENIGN SYSTEMIC - Primary   Initial BP was high will recheck again.         Endocrine   Type 2 diabetes mellitus with stage 3a chronic kidney disease, without long-term current use of insulin (HCC)   Lab Results  Component Value Date   HGBA1C 6.0 (A) 03/14/2023   A1C looks great today at 6.0.  Continue to read current regimen continue to keep carbs and sugars to a minimum and work on increasing vegetables and lean proteins.      Relevant Orders   POCT HgB A1C (Completed)     Other   Major depressive disorder, recurrent episode (HCC)   Well controlled. Continue current regimen. Follow up in  2mo  Other Visit Diagnoses       Seborrheic keratoses       Relevant Medications   tazarotene (TAZORAC) 0.05 % cream      Switch to tretinoin cream to Tazorac to see if it might be better covered with the insurance.  Return in about 4 months (around 07/13/2023) for Diabetes follow-up.    Nani Gasser, MD

## 2023-03-14 NOTE — Assessment & Plan Note (Signed)
Initial BP was high will recheck again.

## 2023-03-14 NOTE — Assessment & Plan Note (Signed)
Well controlled. Continue current regimen. Follow up in  6 mo  

## 2023-03-14 NOTE — Assessment & Plan Note (Signed)
Lab Results  Component Value Date   HGBA1C 6.0 (A) 03/14/2023   A1C looks great today at 6.0.  Continue to read current regimen continue to keep carbs and sugars to a minimum and work on increasing vegetables and lean proteins.

## 2023-03-22 NOTE — Telephone Encounter (Signed)
 Meds ordered this encounter  Medications   triamcinolone cream (KENALOG) 0.1 %    Sig: Apply 1 Application topically daily as needed.    Dispense:  30 g    Refill:  0

## 2023-04-18 DIAGNOSIS — Z1331 Encounter for screening for depression: Secondary | ICD-10-CM | POA: Diagnosis not present

## 2023-04-18 DIAGNOSIS — M25512 Pain in left shoulder: Secondary | ICD-10-CM | POA: Diagnosis not present

## 2023-04-18 DIAGNOSIS — M19031 Primary osteoarthritis, right wrist: Secondary | ICD-10-CM | POA: Diagnosis not present

## 2023-04-22 DIAGNOSIS — N891 Moderate vaginal dysplasia: Secondary | ICD-10-CM | POA: Diagnosis not present

## 2023-04-29 ENCOUNTER — Encounter: Payer: Self-pay | Admitting: Family Medicine

## 2023-04-29 ENCOUNTER — Ambulatory Visit (INDEPENDENT_AMBULATORY_CARE_PROVIDER_SITE_OTHER): Payer: Medicare HMO | Admitting: Family Medicine

## 2023-04-29 VITALS — BP 140/64 | HR 67 | Ht 62.0 in | Wt 140.0 lb

## 2023-04-29 DIAGNOSIS — F331 Major depressive disorder, recurrent, moderate: Secondary | ICD-10-CM | POA: Diagnosis not present

## 2023-04-29 DIAGNOSIS — I10 Essential (primary) hypertension: Secondary | ICD-10-CM

## 2023-04-29 MED ORDER — SERTRALINE HCL 100 MG PO TABS: 200.0000 mg | ORAL_TABLET | Freq: Every day | ORAL | 1 refills | Status: DC

## 2023-04-29 MED ORDER — BUPROPION HCL ER (XL) 150 MG PO TB24: 150.0000 mg | ORAL_TABLET | Freq: Every day | ORAL | 1 refills | Status: DC

## 2023-04-29 MED ORDER — TELMISARTAN-HCTZ 80-12.5 MG PO TABS: 1.0000 | ORAL_TABLET | Freq: Every day | ORAL | 1 refills | Status: DC

## 2023-04-29 NOTE — Assessment & Plan Note (Signed)
   Flowsheet Row Office Visit from 04/29/2023 in [facility] at [facility]  PHQ-9 Total Score 17         04/29/2023    3:42 PM 03/14/2023    2:27 PM 10/12/2022    1:51 PM 09/17/2022    4:09 PM  GAD 7 : Generalized Anxiety Score  Nervous, Anxious, on Edge 2 1 1 1   Control/stop worrying 3 0 0 1  Worry too much - different things 3 1 1 1   Trouble relaxing 2 1 0 2  Restless 1 0 0 2  Easily annoyed or irritable 1 1 1 2   Afraid - awful might happen 0 0 0 1  Total GAD 7 Score 12 4 3 10   Anxiety Difficulty Somewhat difficult Not difficult at all Not difficult at all Somewhat difficult    We discussed options.  She used to take Wellbutrin  in combination with her sertraline .  But she was doing really well at 1 point and we have discontinued it so we will add it back at least for short period of time to see if we can help her with her mood as well as her focus at work.  Maybe in the spring of this summer we can discontinue it I also discussed the option of referral for therapy/counseling she does feel like that would be helpful so we will go ahead and place that referral now.

## 2023-04-29 NOTE — Assessment & Plan Note (Signed)
 Repeat blood pressure better but not completely well-controlled.  Will going to switch her telmisartan  to 10 losartan/HCT.  She did have hydrochlorothiazide  on her file but said she had not taken in a long time it was really more as needed's were going to add it to the combo pill at a 12.5 mg dose.

## 2023-04-29 NOTE — Progress Notes (Signed)
 Established Patient Office Visit  Subjective  Patient ID: Bianca Mckee, female    DOB: 10/04/1950  Age: 73 y.o. MRN: [id number]  Chief Complaint  Patient presents with   mood    HPI  She is here today to discuss her mood.  She is really struggling with her mood over the last couple of months.  She said that she just feels like more down than usual more easily distracted.  Low energy.  She had 1 day about a week ago where she said she just could not get motivated to get out of bed and go to work.  He is on Effexor 200 mg daily and uses her alprazolam  as needed.  Work has also been stressful.  She has a coworker who does not help carry their workload.  She is just noticing she is more easily distracted at work.  She also reports that a few weeks ago she took some liquid THC that her son gave her to help with some arm pain.  She said that she fell asleep and then when her boyfriend came over he could not wake her up from his 15 to 20 minutes.  She woke up feeling out of it and disoriented she is never had that happen before and has not happened since then.  She has not taken any more of the THC.    ROS    Objective:     BP (!) 140/64   Pulse 67   Ht 5\' 2"  (1.575 m)   Wt 140 lb (63.5 kg)   SpO2 97%   BMI 25.61 kg/m    Physical Exam Vitals and nursing note reviewed.  Constitutional:      Appearance: Normal appearance.  HENT:     Head: Normocephalic and atraumatic.  Eyes:     Conjunctiva/sclera: Conjunctivae normal.  Cardiovascular:     Rate and Rhythm: Normal rate and regular rhythm.  Pulmonary:     Effort: Pulmonary effort is normal.     Breath sounds: Normal breath sounds.  Skin:    General: Skin is warm and dry.  Neurological:     Mental Status: She is alert.  Psychiatric:        Mood and Affect: Mood normal.      No results found for any visits on 04/29/23.    The 10-year ASCVD risk score (Arnett DK, et al., 2019) is: 30.5%    Assessment & Plan:    Problem List Items Addressed This Visit       Cardiovascular and Mediastinum   HYPERTENSION, BENIGN SYSTEMIC - Primary   Repeat blood pressure better but not completely well-controlled.  Will going to switch her telmisartan  to 10 losartan/HCT.  She did have hydrochlorothiazide  on her file but said she had not taken in a long time it was really more as needed's were going to add it to the combo pill at a 12.5 mg dose.      Relevant Medications   telmisartan -hydrochlorothiazide  (MICARDIS  HCT) 80-12.5 MG tablet     Other   Major depressive disorder, recurrent episode (HCC)     Flowsheet Row Office Visit from 04/29/2023 in [facility] & [facility] Medicine at [facility]  PHQ-9 Total Score 17         04/29/2023    3:42 PM 03/14/2023    2:27 PM 10/12/2022    1:51 PM 09/17/2022    4:09 PM  GAD 7 : Generalized Anxiety Score  Nervous, Anxious, on  Edge 2 1 1 1   Control/stop worrying 3 0 0 1  Worry too much - different things 3 1 1 1   Trouble relaxing 2 1 0 2  Restless 1 0 0 2  Easily annoyed or irritable 1 1 1 2   Afraid - awful might happen 0 0 0 1  Total GAD 7 Score 12 4 3 10   Anxiety Difficulty Somewhat difficult Not difficult at all Not difficult at all Somewhat difficult    We discussed options.  She used to take Wellbutrin  in combination with her sertraline .  But she was doing really well at 1 point and we have discontinued it so we will add it back at least for short period of time to see if we can help her with her mood as well as her focus at work.  Maybe in the spring of this summer we can discontinue it I also discussed the option of referral for therapy/counseling she does feel like that would be helpful so we will go ahead and place that referral now.      Relevant Medications   buPROPion  (WELLBUTRIN  XL) 150 MG 24 hr tablet   sertraline  (ZOLOFT ) 100 MG tablet   Other Relevant Orders   Ambulatory referral to Behavioral Health    Return in about 2  months (around 06/27/2023) for Mood.    Duaine German, MD

## 2023-05-02 ENCOUNTER — Other Ambulatory Visit: Payer: Self-pay | Admitting: Family Medicine

## 2023-05-02 DIAGNOSIS — I10 Essential (primary) hypertension: Secondary | ICD-10-CM

## 2023-05-20 ENCOUNTER — Encounter: Payer: Self-pay | Admitting: Family Medicine

## 2023-05-20 NOTE — Telephone Encounter (Signed)
 Ok to drop off FMLA.  Also is she seeing a counselor

## 2023-05-23 ENCOUNTER — Other Ambulatory Visit: Payer: Self-pay | Admitting: Family Medicine

## 2023-05-23 DIAGNOSIS — F331 Major depressive disorder, recurrent, moderate: Secondary | ICD-10-CM

## 2023-05-31 DIAGNOSIS — M1811 Unilateral primary osteoarthritis of first carpometacarpal joint, right hand: Secondary | ICD-10-CM | POA: Diagnosis not present

## 2023-05-31 DIAGNOSIS — M25512 Pain in left shoulder: Secondary | ICD-10-CM | POA: Diagnosis not present

## 2023-06-03 DIAGNOSIS — N952 Postmenopausal atrophic vaginitis: Secondary | ICD-10-CM | POA: Diagnosis not present

## 2023-06-03 DIAGNOSIS — N891 Moderate vaginal dysplasia: Secondary | ICD-10-CM | POA: Diagnosis not present

## 2023-06-05 ENCOUNTER — Encounter: Payer: Self-pay | Admitting: Family Medicine

## 2023-06-05 ENCOUNTER — Other Ambulatory Visit: Payer: Self-pay | Admitting: Family Medicine

## 2023-06-05 DIAGNOSIS — F331 Major depressive disorder, recurrent, moderate: Secondary | ICD-10-CM

## 2023-06-05 DIAGNOSIS — G4700 Insomnia, unspecified: Secondary | ICD-10-CM

## 2023-06-05 NOTE — Telephone Encounter (Signed)
 Last OV 04/29/2023 Next OV 07/11/2023 Last RF 01/10/2023 #30 with 1 refill   Denied Sertraline 100 mg this was sent on 04/29/2023 #180 1 refill. To early for refill

## 2023-06-13 DIAGNOSIS — M25512 Pain in left shoulder: Secondary | ICD-10-CM | POA: Diagnosis not present

## 2023-07-11 ENCOUNTER — Ambulatory Visit (INDEPENDENT_AMBULATORY_CARE_PROVIDER_SITE_OTHER): Payer: Medicare HMO | Admitting: Family Medicine

## 2023-07-11 ENCOUNTER — Encounter: Payer: Self-pay | Admitting: Family Medicine

## 2023-07-11 VITALS — BP 114/75 | HR 62 | Ht 62.0 in | Wt 135.0 lb

## 2023-07-11 DIAGNOSIS — E1122 Type 2 diabetes mellitus with diabetic chronic kidney disease: Secondary | ICD-10-CM | POA: Diagnosis not present

## 2023-07-11 DIAGNOSIS — N1831 Chronic kidney disease, stage 3a: Secondary | ICD-10-CM | POA: Diagnosis not present

## 2023-07-11 DIAGNOSIS — Z1231 Encounter for screening mammogram for malignant neoplasm of breast: Secondary | ICD-10-CM

## 2023-07-11 DIAGNOSIS — Z7984 Long term (current) use of oral hypoglycemic drugs: Secondary | ICD-10-CM | POA: Diagnosis not present

## 2023-07-11 DIAGNOSIS — R413 Other amnesia: Secondary | ICD-10-CM | POA: Diagnosis not present

## 2023-07-11 DIAGNOSIS — I10 Essential (primary) hypertension: Secondary | ICD-10-CM

## 2023-07-11 DIAGNOSIS — E785 Hyperlipidemia, unspecified: Secondary | ICD-10-CM

## 2023-07-11 DIAGNOSIS — F331 Major depressive disorder, recurrent, moderate: Secondary | ICD-10-CM

## 2023-07-11 LAB — POCT GLYCOSYLATED HEMOGLOBIN (HGB A1C): Hemoglobin A1C: 5.7 % — AB (ref 4.0–5.6)

## 2023-07-11 NOTE — Assessment & Plan Note (Signed)
 Blood pressure looks great today.  Continue current regimen.  No concerns.  If anything monitor for low blood pressure.

## 2023-07-11 NOTE — Assessment & Plan Note (Signed)
 finally feels better if she feels like she just was in some type of funk over the wintertime she says is not unusual so I suspect she probably has a seasonal component to her depression so right now she is actually doing really well we discussed trying to decrease the sertraline  down since she is already on the max dose at 200 mg daily will try to cut down to 150 mg daily will maintain the Wellbutrin  she feels like the addition of the Wellbutrin  was really helpful.

## 2023-07-11 NOTE — Assessment & Plan Note (Signed)
 06/2023: MOCA score

## 2023-07-11 NOTE — Patient Instructions (Signed)
 Decrease Sertraline  to 1.5 tabs daily for one month.  If doing well then ok to decrease to 1 tab daily

## 2023-07-11 NOTE — Progress Notes (Addendum)
 Established Patient Office Visit  Subjective  Patient ID: Bianca Mckee, female    DOB: 1950-11-21  Age: 73 y.o. MRN: [id number]  Chief Complaint  Patient presents with   Diabetes   Hypertension    HPI  F/U Mood -she is doing well overall.  Work has been stressful.  But otherwise doing okay.  Hypertension- Pt denies chest pain, SOB, dizziness, or heart palpitations.  Taking meds as directed w/o problems.  Denies medication side effects.    Diabetes - no hypoglycemic events. No wounds or sores that are not healing well. No increased thirst or urination. Checking glucose at home. Taking medications as prescribed without any side effects.  She also reports that she feels like her memory is getting a little bit worse.  She says sometimes she will be in the middle of telling somebody something and then forget what she was going to say.  Sleep has been stable on trazodone  she has not had any significant changes in sleep quality.  She feels like it has been getting progressively worse over the last couple years.   ROS    Objective:     BP 114/75   Pulse 62   Ht 5\' 2"  (1.575 m)   Wt 135 lb (61.2 kg)   SpO2 100%   BMI 24.69 kg/m    Physical Exam Vitals and nursing note reviewed.  Constitutional:      Appearance: Normal appearance.  HENT:     Head: Normocephalic and atraumatic.  Eyes:     Conjunctiva/sclera: Conjunctivae normal.  Cardiovascular:     Rate and Rhythm: Normal rate and regular rhythm.  Pulmonary:     Effort: Pulmonary effort is normal.     Breath sounds: Normal breath sounds.  Skin:    General: Skin is warm and dry.  Neurological:     Mental Status: She is alert.  Psychiatric:        Mood and Affect: Mood normal.      Results for orders placed or performed in visit on 07/11/23  POCT HgB A1C  Result Value Ref Range   Hemoglobin A1C 5.7 (A) 4.0 - 5.6 %   HbA1c POC (<> result, manual entry)     HbA1c, POC (prediabetic range)     HbA1c, POC  (controlled diabetic range)        The 10-year ASCVD risk score (Arnett DK, et al., 2019) is: 21.4%    Assessment & Plan:   Problem List Items Addressed This Visit       Cardiovascular and Mediastinum   HYPERTENSION, BENIGN SYSTEMIC - Primary   Blood pressure looks great today.  Continue current regimen.  No concerns.  If anything monitor for low blood pressure.      Relevant Orders   CMP14+EGFR   Lipid panel   CBC     Endocrine   Type 2 diabetes mellitus with stage 3a chronic kidney disease, without long-term current use of insulin (HCC)   A1c looks great today.  Continue to work on healthy diet and regular exercise.  Lab Results  Component Value Date   HGBA1C 5.7 (A) 07/11/2023         Relevant Orders   POCT HgB A1C (Completed)   CMP14+EGFR   Lipid panel   CBC     Other   Memory change   06/2023: MOCA score       Major depressive disorder, recurrent episode (HCC)   finally feels better if she feels like she  just was in some type of funk over the wintertime she says is not unusual so I suspect she probably has a seasonal component to her depression so right now she is actually doing really well we discussed trying to decrease the sertraline  down since she is already on the max dose at 200 mg daily will try to cut down to 150 mg daily will maintain the Wellbutrin  she feels like the addition of the Wellbutrin  was really helpful.      Other Visit Diagnoses       Screening mammogram for breast cancer       Relevant Orders   MM 3D SCREENING MAMMOGRAM BILATERAL BREAST       Return in about 4 months (around 11/10/2023) for Mood.    Duaine German, MD

## 2023-07-11 NOTE — Assessment & Plan Note (Signed)
 A1c looks great today.  Continue to work on healthy diet and regular exercise.  Lab Results  Component Value Date   HGBA1C 5.7 (A) 07/11/2023

## 2023-07-12 ENCOUNTER — Encounter: Payer: Self-pay | Admitting: Family Medicine

## 2023-07-12 DIAGNOSIS — E119 Type 2 diabetes mellitus without complications: Secondary | ICD-10-CM

## 2023-07-12 DIAGNOSIS — E785 Hyperlipidemia, unspecified: Secondary | ICD-10-CM

## 2023-07-12 LAB — CBC
Hematocrit: 34.2 % (ref 34.0–46.6)
Hemoglobin: 11.6 g/dL (ref 11.1–15.9)
MCH: 31.2 pg (ref 26.6–33.0)
MCHC: 33.9 g/dL (ref 31.5–35.7)
MCV: 92 fL (ref 79–97)
Platelets: 285 10*3/uL (ref 150–450)
RBC: 3.72 x10E6/uL — ABNORMAL LOW (ref 3.77–5.28)
RDW: 13.1 % (ref 11.7–15.4)
WBC: 6.8 10*3/uL (ref 3.4–10.8)

## 2023-07-12 LAB — CMP14+EGFR
ALT: 10 IU/L (ref 0–32)
AST: 13 IU/L (ref 0–40)
Albumin: 4.4 g/dL (ref 3.9–4.9)
Alkaline Phosphatase: 82 IU/L (ref 44–121)
BUN/Creatinine Ratio: 22 (ref 12–28)
BUN: 22 mg/dL (ref 8–27)
Bilirubin Total: <0.2 mg/dL (ref 0.0–1.2)
CO2: 24 mmol/L (ref 20–29)
Calcium: 9.3 mg/dL (ref 8.7–10.3)
Chloride: 102 mmol/L (ref 96–106)
Creatinine, Ser: 0.98 mg/dL (ref 0.57–1.00)
Globulin, Total: 2.2 g/dL (ref 1.5–4.5)
Glucose: 80 mg/dL (ref 70–99)
Potassium: 4.3 mmol/L (ref 3.5–5.2)
Sodium: 139 mmol/L (ref 134–144)
Total Protein: 6.6 g/dL (ref 6.0–8.5)
eGFR: 62 mL/min/{1.73_m2} (ref >59)

## 2023-07-12 LAB — LIPID PANEL
Chol/HDL Ratio: 6 ratio — ABNORMAL HIGH (ref 0.0–4.4)
Cholesterol, Total: 294 mg/dL — ABNORMAL HIGH (ref 100–199)
HDL: 49 mg/dL (ref >39)
LDL Chol Calc (NIH): 208 mg/dL — ABNORMAL HIGH (ref 0–99)
Triglycerides: 194 mg/dL — ABNORMAL HIGH (ref 0–149)
VLDL Cholesterol Cal: 37 mg/dL (ref 5–40)

## 2023-07-12 NOTE — Telephone Encounter (Signed)
 Per notes on 07/11/23, patient is doing better.

## 2023-07-12 NOTE — Telephone Encounter (Signed)
Which one?

## 2023-07-12 NOTE — Addendum Note (Signed)
 Addended by: Daya Dutt D on: 07/12/2023 08:42 AM   Modules accepted: Orders

## 2023-07-12 NOTE — Progress Notes (Signed)
 Hi Bianca Mckee, total cholesterol and LDL are very elevated.  This puts you into an extremely high risk category for heart attack or stroke.  Are you taking your atorvastatin ?  Your levels are high enough that we might even want to test you for familial hypercholesterolemia.  Blood count is normal.  Metabolic panel looks great.  Lab test ordered please come by at her convenience in the next couple of weeks to have that drawn.

## 2023-07-15 MED ORDER — ATORVASTATIN CALCIUM 80 MG PO TABS: 80.0000 mg | ORAL_TABLET | Freq: Every day | ORAL | 0 refills | Status: DC

## 2023-07-17 DIAGNOSIS — M25512 Pain in left shoulder: Secondary | ICD-10-CM | POA: Diagnosis not present

## 2023-07-17 DIAGNOSIS — M1811 Unilateral primary osteoarthritis of first carpometacarpal joint, right hand: Secondary | ICD-10-CM | POA: Diagnosis not present

## 2023-07-17 DIAGNOSIS — M79644 Pain in right finger(s): Secondary | ICD-10-CM | POA: Diagnosis not present

## 2023-07-23 ENCOUNTER — Encounter: Payer: Self-pay | Admitting: Family Medicine

## 2023-07-24 ENCOUNTER — Ambulatory Visit: Payer: Self-pay

## 2023-07-24 ENCOUNTER — Ambulatory Visit (INDEPENDENT_AMBULATORY_CARE_PROVIDER_SITE_OTHER): Admitting: Physician Assistant

## 2023-07-24 VITALS — BP 126/80 | HR 72 | Ht 62.0 in | Wt 132.0 lb

## 2023-07-24 DIAGNOSIS — J3489 Other specified disorders of nose and nasal sinuses: Secondary | ICD-10-CM | POA: Diagnosis not present

## 2023-07-24 DIAGNOSIS — M542 Cervicalgia: Secondary | ICD-10-CM

## 2023-07-24 DIAGNOSIS — H8113 Benign paroxysmal vertigo, bilateral: Secondary | ICD-10-CM

## 2023-07-24 DIAGNOSIS — G8929 Other chronic pain: Secondary | ICD-10-CM

## 2023-07-24 DIAGNOSIS — E785 Hyperlipidemia, unspecified: Secondary | ICD-10-CM | POA: Diagnosis not present

## 2023-07-24 DIAGNOSIS — R42 Dizziness and giddiness: Secondary | ICD-10-CM

## 2023-07-24 MED ORDER — MECLIZINE HCL 25 MG PO TABS
25.0000 mg | ORAL_TABLET | Freq: Three times a day (TID) | ORAL | 0 refills | Status: DC | PRN
Start: 2023-07-24 — End: 2023-08-13

## 2023-07-24 MED ORDER — FLUTICASONE PROPIONATE 50 MCG/ACT NA SUSP: 2.0000 | Freq: Every day | NASAL | 0 refills | Status: DC

## 2023-07-24 NOTE — Telephone Encounter (Signed)
 FYI - Patient is scheduled to see the provider today.

## 2023-07-24 NOTE — Patient Instructions (Signed)
 Antivert as needed for dizziness.  Use flonase 2 sprays each nostril for 1 week.  Do epley manuevers twice a day for 3 rounds

## 2023-07-24 NOTE — Progress Notes (Signed)
   Acute Office Visit  Subjective:     Patient ID: Bianca Mckee, female    DOB: 1950-12-27, 73 y.o.   MRN: [id number]  Chief Complaint  Patient presents with   Medical Management of Chronic Issues     Dizziness    HPI Patient is in today for dizziness for the past 3 days. She notices when she changes positions of her head the room spins. She has never had anything like this before. Her mother had vertigo. She reports having sinus pressure, congestion, ear popping for the last few weeks. She has been treating with OTC medications. She denies any fever, chills, SOB or cough. She has been having more headaches but resolved with ibuprofen. She has ongoing neck pain and stiffiness.    ROS See HPI.     Objective:    BP 126/80   Pulse 72   Ht 5\' 2"  (1.575 m)   Wt 132 lb (59.9 kg)   SpO2 99%   BMI 24.14 kg/m  BP Readings from Last 3 Encounters:  07/24/23 126/80  07/11/23 114/75  04/29/23 (!) 140/64   Wt Readings from Last 3 Encounters:  07/24/23 132 lb (59.9 kg)  07/11/23 135 lb (61.2 kg)  04/29/23 140 lb (63.5 kg)    Physical Exam Constitutional:      Appearance: Normal appearance.  Cardiovascular:     Rate and Rhythm: Normal rate and regular rhythm.  Pulmonary:     Effort: Pulmonary effort is normal.     Breath sounds: Normal breath sounds.  Musculoskeletal:     Comments: Decreased ROM side to side head due to pain and stiffness.   Neurological:     General: No focal deficit present.     Mental Status: She is alert.     Motor: No weakness.     Gait: Gait normal.     Comments: Dix hallpike without nystagmus but with dizziness bilaterally with position changes and worse with sitting up.           Assessment & Plan:  .Aaron AasCarington was seen today for medical management of chronic issues.  Diagnoses and all orders for this visit:  Benign paroxysmal positional vertigo due to bilateral vestibular disorder -     meclizine  (ANTIVERT ) 25 MG tablet; Take 1 tablet (25  mg total) by mouth 3 (three) times daily as needed for dizziness. -     fluticasone  (FLONASE ) 50 MCG/ACT nasal spray; Place 2 sprays into both nostrils daily.  Dizziness -     meclizine  (ANTIVERT ) 25 MG tablet; Take 1 tablet (25 mg total) by mouth 3 (three) times daily as needed for dizziness. -     fluticasone  (FLONASE ) 50 MCG/ACT nasal spray; Place 2 sprays into both nostrils daily.  Sinus pressure -     fluticasone  (FLONASE ) 50 MCG/ACT nasal spray; Place 2 sprays into both nostrils daily.  Neck pain, chronic   Pt has chronic neck pain and stiffness noted on exam today. Encouraged regular massages/exercises/stretches to help with this. Consider sleeping with cervical pillow for support.   Neck pain made dix hallpike hard but she did report dizziness with position changes to suggest BPPV Orthostatic BP were normal view in extended vitals.  Discussed epley manuevers Antivert  given to use as needed Could be sinus symptoms causing BPPV Start flonase  for sinus pressure and congestion and to help with ear pressure Follow up as needed if symptoms worsen or persist  Sandy Crumb, PA-C

## 2023-07-24 NOTE — Telephone Encounter (Signed)
 Chief Complaint: Dizziness Symptoms: Dizziness, intermittent wobbly when walking Frequency: 1 month, worsened over last two days Pertinent Negatives: Patient denies numbness/tingling, chest pain, difficulty breathing Disposition: [] ED /[] Urgent Care (no appt availability in office) / [x] Appointment(In office/virtual)/ []  [facility]/ [] Home Care/ [] Refused Recommended Disposition /[] [facility]/ []  Follow-up with PCP Additional Notes: Patient called in stating she has been experiencing dizziness for roughly 1 month, but it has worsened over last two days. Patient states "I have been nervous about my high cholesterol and not sure if I am stressing too much". Patient reports when she gets up in the middle of the night to walk to restroom she is very wobbly, and noticed she was wobbly at work. Patient does take telmisartan -hydrochlorothiazide  and uncertain if dehydration or hypotension is a factor. Patient appt made for today for evaluation.    Copied from CRM [id number]. Topic: Clinical - Red Word Triage >> Jul 24, 2023 11:08 AM Retta Caster wrote: Red Word that prompted transfer to Nurse Triage: Dizzyness 1 month but 1-2 days worse Reason for Disposition  [1] MODERATE dizziness (e.g., interferes with normal activities) AND [2] has NOT been evaluated by doctor (or NP/PA) for this  (Exception: Dizziness caused by heat exposure, sudden standing, or poor fluid intake.)  Answer Assessment - Initial Assessment Questions 1. DESCRIPTION: "Describe your dizziness."     Room is spinning 2. LIGHTHEADED: "Do you feel lightheaded?" (e.g., somewhat faint, woozy, weak upon standing)     Room is spinning 3. VERTIGO: "Do you feel like either you or the room is spinning or tilting?" (i.e. vertigo)     Room is spinning 4. SEVERITY: "How bad is it?"  "Do you feel like you are going to faint?" "Can you stand and walk?"   - MILD: Feels slightly dizzy, but walking normally.   - MODERATE: Feels unsteady  when walking, but not falling; interferes with normal activities (e.g., school, work).   - SEVERE: Unable to walk without falling, or requires assistance to walk without falling; feels like passing out now.      Wobbly while walking 5. ONSET:  "When did the dizziness begin?"     On and off about a month 6. AGGRAVATING FACTORS: "Does anything make it worse?" (e.g., standing, change in head position)     Uncertain  7. HEART RATE: "Can you tell me your heart rate?" "How many beats in 15 seconds?"  (Note: not all patients can do this)       N/a 8. CAUSE: "What do you think is causing the dizziness?"     Unsure 9. RECURRENT SYMPTOM: "Have you had dizziness before?" If Yes, ask: "When was the last time?" "What happened that time?"     N/a 10. OTHER SYMPTOMS: "Do you have any other symptoms?" (e.g., fever, chest pain, vomiting, diarrhea, bleeding)       No  Protocols used: Dizziness - Lightheadedness-A-AH

## 2023-07-26 ENCOUNTER — Encounter: Payer: Self-pay | Admitting: Physician Assistant

## 2023-07-26 DIAGNOSIS — G8929 Other chronic pain: Secondary | ICD-10-CM | POA: Insufficient documentation

## 2023-07-26 DIAGNOSIS — H8113 Benign paroxysmal vertigo, bilateral: Secondary | ICD-10-CM | POA: Insufficient documentation

## 2023-07-30 DIAGNOSIS — Z008 Encounter for other general examination: Secondary | ICD-10-CM | POA: Diagnosis not present

## 2023-07-30 DIAGNOSIS — G4700 Insomnia, unspecified: Secondary | ICD-10-CM | POA: Diagnosis not present

## 2023-07-30 DIAGNOSIS — F411 Generalized anxiety disorder: Secondary | ICD-10-CM | POA: Diagnosis not present

## 2023-07-31 ENCOUNTER — Other Ambulatory Visit: Payer: Self-pay | Admitting: Family Medicine

## 2023-07-31 DIAGNOSIS — L821 Other seborrheic keratosis: Secondary | ICD-10-CM

## 2023-08-02 ENCOUNTER — Ambulatory Visit: Payer: Self-pay | Admitting: Family Medicine

## 2023-08-02 LAB — FAMILIAL HYPERCHOLESTEROLEMIA

## 2023-08-02 NOTE — Progress Notes (Signed)
 Hi Sheleen, it looks like you are negative for the genes for familial hypercholesterolemia which is good.  So for now we will just continue to work on healthy diet and regular exercise.  Plus continue to take your atorvastatin .  If you need any refills let us  know.

## 2023-08-07 ENCOUNTER — Ambulatory Visit (INDEPENDENT_AMBULATORY_CARE_PROVIDER_SITE_OTHER)

## 2023-08-07 DIAGNOSIS — Z1231 Encounter for screening mammogram for malignant neoplasm of breast: Secondary | ICD-10-CM

## 2023-08-08 ENCOUNTER — Other Ambulatory Visit: Payer: Self-pay | Admitting: Family Medicine

## 2023-08-08 ENCOUNTER — Encounter (INDEPENDENT_AMBULATORY_CARE_PROVIDER_SITE_OTHER): Payer: Self-pay | Admitting: Family Medicine

## 2023-08-08 DIAGNOSIS — G4700 Insomnia, unspecified: Secondary | ICD-10-CM | POA: Diagnosis not present

## 2023-08-09 MED ORDER — TRAZODONE HCL 100 MG PO TABS: 100.0000 mg | ORAL_TABLET | Freq: Every day | ORAL | 3 refills | Status: AC

## 2023-08-09 NOTE — Telephone Encounter (Signed)

## 2023-08-13 ENCOUNTER — Ambulatory Visit (INDEPENDENT_AMBULATORY_CARE_PROVIDER_SITE_OTHER): Payer: Medicare HMO

## 2023-08-13 VITALS — Ht 63.0 in | Wt 132.0 lb

## 2023-08-13 DIAGNOSIS — Z780 Asymptomatic menopausal state: Secondary | ICD-10-CM | POA: Diagnosis not present

## 2023-08-13 DIAGNOSIS — Z0000 Encounter for general adult medical examination without abnormal findings: Secondary | ICD-10-CM

## 2023-08-13 DIAGNOSIS — Z13820 Encounter for screening for osteoporosis: Secondary | ICD-10-CM

## 2023-08-13 NOTE — Progress Notes (Signed)
 Subjective:   ERCIA CRISAFULLI is a 73 y.o. female who presents for Medicare Annual (Subsequent) preventive examination.  Visit Complete: Virtual I connected with  Laurell Pond on 08/13/23 by a audio enabled telemedicine application and verified that I am speaking with the correct person using two identifiers.  Patient Location: Other:  work  Restaurant manager, fast food Location: Office/Clinic  I discussed the limitations of evaluation and management by telemedicine. The patient expressed understanding and agreed to proceed.  Vital Signs: Because this visit was a virtual/telehealth visit, some criteria may be missing or patient reported. Any vitals not documented were not able to be obtained and vitals that have been documented are patient reported.  Patient Medicare AWV questionnaire was completed by the patient on n/a; I have confirmed that all information answered by patient is correct and no changes since this date.  Cardiac Risk Factors include: advanced age (>79men, >41 women);dyslipidemia;diabetes mellitus;hypertension;family history of premature cardiovascular disease     Objective:    Today's Vitals   08/13/23 1300  Weight: 132 lb (59.9 kg)  Height: 5\' 3"  (1.6 m)   Body mass index is 23.38 kg/m.     08/13/2023    1:22 PM 08/08/2022    8:07 AM 07/19/2021   10:46 AM 07/08/2020    2:14 PM 06/07/2020    1:27 PM 12/02/2018    3:08 PM  Advanced Directives  Does Patient Have a Medical Advance Directive? No No No No No No  Would patient like information on creating a medical advance directive? No - Patient declined No - Patient declined No - Patient declined No - Patient declined No - Patient declined No - Patient declined    Current Medications (verified) Outpatient Encounter Medications as of 08/13/2023  Medication Sig   ACCU-CHEK GUIDE test strip USE DAILY AS DIRECTED   Accu-Chek Softclix Lancets lancets    atorvastatin  (LIPITOR) 80 MG tablet Take 1 tablet (80 mg total) by mouth at  bedtime.   blood glucose meter kit and supplies Dispense based on patient and insurance preference. Use to test blood sugar once daily. Dx: E11.9   buPROPion  (WELLBUTRIN  XL) 150 MG 24 hr tablet Take 1 tablet (150 mg total) by mouth daily.   fluticasone  (FLONASE ) 50 MCG/ACT nasal spray Place 2 sprays into both nostrils daily.   sertraline  (ZOLOFT ) 100 MG tablet Take 2 tablets (200 mg total) by mouth daily.   telmisartan -hydrochlorothiazide  (MICARDIS  HCT) 80-12.5 MG tablet Take 1 tablet by mouth daily.   traZODone  (DESYREL ) 100 MG tablet Take 1 tablet (100 mg total) by mouth at bedtime.   triamcinolone  cream (KENALOG ) 0.1 % APPLY TOPICALLY DAILY AS NEEDED   ALPRAZolam  (XANAX ) 1 MG tablet TAKE 1/2-1 TABLET BY MOUTH DAILY AS NEEDED FOR ANXIETY (Patient not taking: Reported on 08/13/2023)   amLODipine  (NORVASC ) 5 MG tablet Take 1 tablet (5 mg total) by mouth daily. (Patient not taking: Reported on 08/13/2023)   metFORMIN  (GLUCOPHAGE ) 1000 MG tablet TAKE 1 TABLET (1,000 MG TOTAL) BY MOUTH TWICE A DAY WITH FOOD (Patient not taking: Reported on 08/13/2023)   [DISCONTINUED] meclizine  (ANTIVERT ) 25 MG tablet Take 1 tablet (25 mg total) by mouth 3 (three) times daily as needed for dizziness.   No facility-administered encounter medications on file as of 08/13/2023.    Allergies (verified) Ambien  [zolpidem  tartrate], Codeine phosphate, Eszopiclone , and Prednisone    History: Past Medical History:  Diagnosis Date   Anxiety    Arthritis 2016   Depression 1996   Diabetes mellitus without  complication (HCC)    GERD (gastroesophageal reflux disease)    Hypertension 1996   Neuromuscular disorder (HCC) 4-15   Shingles   Vaginal Pap smear, abnormal    Past Surgical History:  Procedure Laterality Date   ABDOMINAL HYSTERECTOMY  2020   ARTHROPLASTY Left 2022   left knee   CERVIX LESION DESTRUCTION     JOINT REPLACEMENT  2017&2022   thumb surgery Right 09/13/2021   TUBAL LIGATION     Family History   Problem Relation Age of Onset   Lung cancer Brother 21       smoker   Pancreatic cancer Brother    Pancreatic cancer Brother    Lung cancer Brother    Diabetes Mother    Arthritis Father    Hypertension Father    ADD / ADHD Son    Social History   Socioeconomic History   Marital status: Divorced    Spouse name: Not on file   Number of children: 1   Years of education: 12   Highest education level: 12th grade  Occupational History   Occupation: Full time   Tobacco Use   Smoking status: Never   Smokeless tobacco: Never  Vaping Use   Vaping status: Never Used  Substance and Sexual Activity   Alcohol use: Yes    Alcohol/week: 1.0 standard drink of alcohol    Types: 1 Shots of liquor per week    Comment: occasionally   Drug use: No   Sexual activity: Yes    Birth control/protection: Post-menopausal, None  Other Topics Concern   Not on file  Social History Narrative   Still working at [employer] in [place]. Lives alone. She enjoys watching t.v.    Social Drivers of Health   Financial Resource Strain: Low Risk  (08/13/2023)   Overall Financial Resource Strain (CARDIA)    Difficulty of Paying Living Expenses: Not hard at all  Food Insecurity: No Food Insecurity (08/13/2023)   Hunger Vital Sign    Worried About Running Out of Food in the Last Year: Never true    Ran Out of Food in the Last Year: Never true  Transportation Needs: No Transportation Needs (08/13/2023)   PRAPARE - Administrator, Civil Service (Medical): No    Lack of Transportation (Non-Medical): No  Physical Activity: Insufficiently Active (08/13/2023)   Exercise Vital Sign    Days of Exercise per Week: 2 days    Minutes of Exercise per Session: 20 min  Stress: Stress Concern Present (08/13/2023)   [employer] of Occupational Health - Occupational Stress Questionnaire    Feeling of Stress : Rather much  Social Connections: Moderately Isolated (08/13/2023)   Social Connection and  Isolation Panel [NHANES]    Frequency of Communication with Friends and Family: More than three times a week    Frequency of Social Gatherings with Friends and Family: Once a week    Attends Religious Services: Never    Database administrator or Organizations: Yes    Attends Engineer, structural: More than 4 times per year    Marital Status: Divorced    Tobacco Counseling Counseling given: Not Answered   Clinical Intake:  Pre-visit preparation completed: Yes  Pain : No/denies pain     BMI - recorded: 23.38 Nutritional Status: BMI of 19-24  Normal Nutritional Risks: None Diabetes: Yes CBG done?: No Did pt. bring in CBG monitor from home?: No  How often do you need to have someone  help you when you read instructions, pamphlets, or other written materials from your doctor or pharmacy?: 1 - Never What is the last grade level you completed in school?: 12  Interpreter Needed?: No      Activities of Daily Living    08/13/2023    1:05 PM  In your present state of health, do you have any difficulty performing the following activities:  Hearing? 0  Vision? 1  Comment Eye appointment tomorrow  Difficulty concentrating or making decisions? 1  Walking or climbing stairs? 1  Dressing or bathing? 0  Doing errands, shopping? 0  Preparing Food and eating ? N  Using the Toilet? N  In the past six months, have you accidently leaked urine? N  Do you have problems with loss of bowel control? N  Managing your Medications? N  Managing your Finances? N  Housekeeping or managing your Housekeeping? N    Patient Care Team: Cydney Draft, MD as PCP - Jenness Mock, MD as Referring Physician (Orthopedic Surgery) Amelie Baize, MD as Referring Physician (Obstetrics and Gynecology)  Indicate any recent Medical Services you may have received from other than [facility] providers in the past year (date may be approximate).     Assessment:   This is a routine  wellness examination for Hayes.  Hearing/Vision screen No results found.   Goals Addressed             This Visit's Progress    Patient Stated       Patient states she would like to increase water intake and increase exercise.       Depression Screen    08/13/2023    1:20 PM 07/11/2023    4:30 PM 04/29/2023    3:42 PM 03/14/2023    2:27 PM 10/12/2022    1:50 PM 10/12/2022    1:31 PM 09/17/2022    4:09 PM  PHQ 2/9 Scores  PHQ - 2 Score 0 1 6 2 2 2 3   PHQ- 9 Score  3 17 6 6  14     Fall Risk    08/13/2023    1:23 PM 10/12/2022    1:31 PM 09/17/2022    4:08 PM 08/08/2022    8:10 AM 07/24/2022    1:06 PM  Fall Risk   Falls in the past year? 1 1 1  0 0  Number falls in past yr: 0 0 0 0 0  Injury with Fall? 1 1 1  0 0  Risk for fall due to : History of fall(s) History of fall(s) No Fall Risks History of fall(s) No Fall Risks  Follow up Falls evaluation completed Falls evaluation completed Falls evaluation completed Falls evaluation completed Falls evaluation completed    MEDICARE RISK AT HOME: Medicare Risk at Home Any stairs in or around the home?: No Home free of loose throw rugs in walkways, pet beds, electrical cords, etc?: No Adequate lighting in your home to reduce risk of falls?: No Life alert?: No Use of a cane, walker or w/c?: No Grab bars in the bathroom?: Yes Shower chair or bench in shower?: No Elevated toilet seat or a handicapped toilet?: Yes  TIMED UP AND GO:  Was the test performed?  No    Cognitive Function:      07/11/2023    4:39 PM 07/11/2023    4:31 PM  Montreal Cognitive Assessment   Visuospatial/ Executive (0/5) 3   Naming (0/3) 3 3  Attention: Read list of digits (0/2) 2 2  Attention: Read list of letters (0/1) 1 1  Attention: Serial 7 subtraction starting at 100 (0/3) 2 2  Language: Repeat phrase (0/2) 2 2  Language : Fluency (0/1) 1 1  Abstraction (0/2) 0 0  Delayed Recall (0/5) 4 4  Orientation (0/6) 6 6  Total 24   Adjusted Score  (based on education) 25       08/13/2023    1:27 PM 08/08/2022    8:16 AM 07/19/2021   10:54 AM 07/08/2020    2:28 PM 12/02/2018    3:14 PM  6CIT Screen  What Year? 0 points 0 points 0 points 0 points 0 points  What month? 0 points 0 points 0 points 0 points 0 points  What time? 0 points 0 points 0 points 0 points 0 points  Count back from 20 0 points 0 points 0 points 0 points 0 points  Months in reverse 0 points 0 points 0 points 0 points 0 points  Repeat phrase 2 points 4 points 0 points 0 points 0 points  Total Score 2 points 4 points 0 points 0 points 0 points    Immunizations Immunization History  Administered Date(s) Administered   Fluad Quad(high Dose 65+) 02/27/2019   Influenza-Unspecified 02/27/2019   PFIZER(Purple Top)SARS-COV-2 Vaccination 05/28/2019, 06/18/2019, 12/30/2019   Pneumococcal Conjugate-13 03/09/2014   Pneumococcal Polysaccharide-23 01/06/2015, 04/12/2020   Tdap 06/14/2015, 05/01/2021   Zoster Recombinant(Shingrix) 12/11/2018   Zoster, Live 01/06/2015    TDAP status: Up to date  Flu Vaccine status: Declined, Education has been provided regarding the importance of this vaccine but patient still declined. Advised may receive this vaccine at local pharmacy or Health Dept. Aware to provide a copy of the vaccination record if obtained from local pharmacy or Health Dept. Verbalized acceptance and understanding.  Pneumococcal vaccine status: Up to date  Covid-19 vaccine status: Completed vaccines  Qualifies for Shingles Vaccine? Yes   Zostavax completed Yes   Shingrix Completed?: No.    Education has been provided regarding the importance of this vaccine. Patient has been advised to call insurance company to determine out of pocket expense if they have not yet received this vaccine. Advised may also receive vaccine at local pharmacy or Health Dept. Verbalized acceptance and understanding.  Screening Tests Health Maintenance  Topic Date Due   Zoster Vaccines-  Shingrix (2 of 2) 02/05/2019   COVID-19 Vaccine (4 - 2024-25 season) 11/18/2022   DEXA SCAN  02/16/2023   FOOT EXAM  05/31/2023   MAMMOGRAM  06/27/2023   OPHTHALMOLOGY EXAM  07/30/2023   Diabetic kidney evaluation - Urine ACR  09/17/2023   INFLUENZA VACCINE  10/18/2023   HEMOGLOBIN A1C  01/10/2024   Diabetic kidney evaluation - eGFR measurement  07/10/2024   Medicare Annual Wellness (AWV)  08/12/2024   Colonoscopy  05/30/2027   DTaP/Tdap/Td (3 - Td or Tdap) 05/02/2031   Pneumonia Vaccine 50+ Years old  Completed   Hepatitis C Screening  Completed   HPV VACCINES  Aged Out   Meningococcal B Vaccine  Aged Out    Health Maintenance  Health Maintenance Due  Topic Date Due   Zoster Vaccines- Shingrix (2 of 2) 02/05/2019   COVID-19 Vaccine (4 - 2024-25 season) 11/18/2022   DEXA SCAN  02/16/2023   FOOT EXAM  05/31/2023   MAMMOGRAM  06/27/2023   OPHTHALMOLOGY EXAM  07/30/2023    Colorectal cancer screening: Type of screening: Colonoscopy. Completed 05/30/2022. Repeat every 10 years  Mammogram status: Completed 08/07/2023. Repeat every year  Bone Density status: Ordered 08/13/2023. Pt provided with contact info and advised to call to schedule appt.  Lung Cancer Screening: (Low Dose CT Chest recommended if Age 17-80 years, 20 pack-year currently smoking OR have quit w/in 15years.) does not qualify.   Lung Cancer Screening Referral: n/a  Additional Screening:  Hepatitis C Screening: does qualify; Completed 01/07/2018  Vision Screening: Recommended annual ophthalmology exams for early detection of glaucoma and other disorders of the eye. Is the patient up to date with their annual eye exam?  Yes  Who is the provider or what is the name of the office in which the patient attends annual eye exams? [facility] in [place] If pt is not established with a provider, would they like to be referred to a provider to establish care? N/a.   Dental Screening: Recommended annual dental exams  for proper oral hygiene  Diabetic Foot Exam: Diabetic Foot Exam: Overdue, Pt has been advised about the importance in completing this exam. Pt is scheduled for diabetic foot exam on 11/07/2023.  Community Resource Referral / Chronic Care Management: CRR required this visit?  No   CCM required this visit?  No     Plan:     I have personally reviewed and noted the following in the patient's chart:   Medical and social history Use of alcohol, tobacco or illicit drugs  Current medications and supplements including opioid prescriptions. Patient is not currently taking opioid prescriptions. Functional ability and status Nutritional status Physical activity Advanced directives List of other physicians Hospitalizations, surgeries, and ER # 1 visits in previous 12 months.  Vitals Screenings to include cognitive, depression, and falls Referrals and appointments  In addition, I have reviewed and discussed with patient certain preventive protocols, quality metrics, and best practice recommendations. A written personalized care plan for preventive services as well as general preventive health recommendations were provided to patient.     Aubrey Leaf, CMA   08/13/2023   After Visit Summary: (MyChart) Due to this being a telephonic visit, the after visit summary with patients personalized plan was offered to patient via MyChart   Nurse Notes:   JERENE YEAGER is a 73 y.o. female patient of Metheney, Corita Diego, MD who had a Medicare Annual Wellness Visit today via telephone. Melysa is Working full time and lives alone. She has 1 child. She is moderately physically active and enjoys reading, walking and being outdoors.   Ordered bone density.

## 2023-08-13 NOTE — Progress Notes (Signed)
 Please call patient. Normal mammogram.  Repeat in 1 year.

## 2023-08-13 NOTE — Patient Instructions (Signed)
  Bianca Mckee , Thank you for taking time to come for your Medicare Wellness Visit. I appreciate your ongoing commitment to your health goals. Please review the following plan we discussed and let me know if I can assist you in the future.   These are the goals we discussed:  Goals       Exercise 3x per week (30 min per time)      Wants to get started back into the gym and start exercising again.      Patient Stated (pt-stated)      07/08/2020 AWV Goal: Improved Nutrition/Diet  Patient will verbalize understanding that diet plays an important role in overall health and that a poor diet is a risk factor for many chronic medical conditions.  Over the next year, patient will improve self management of their diet by incorporating more water. Patient will utilize available community resources to help with food acquisition if needed (ex: food pantries, [address], etc) Patient will work with nutrition specialist if a referral was made       Patient Stated (pt-stated)      Would like to be able to get her legs stronger.      Patient Stated (pt-stated)      Patient stated that she would like to loose 10 lbs.      Patient Stated      Patient states she would like to increase water intake and increase exercise.         This is a list of the screening recommended for you and due dates:  Health Maintenance  Topic Date Due   Zoster (Shingles) Vaccine (2 of 2) 02/05/2019   COVID-19 Vaccine (4 - 2024-25 season) 11/18/2022   DEXA scan (bone density measurement)  02/16/2023   Complete foot exam   05/31/2023   Mammogram  06/27/2023   Eye exam for diabetics  07/30/2023   Yearly kidney health urinalysis for diabetes  09/17/2023   Flu Shot  10/18/2023   Hemoglobin A1C  01/10/2024   Yearly kidney function blood test for diabetes  07/10/2024   Medicare Annual Wellness Visit  08/12/2024   Colon Cancer Screening  05/30/2027   DTaP/Tdap/Td vaccine (3 - Td or Tdap) 05/02/2031   Pneumonia Vaccine   Completed   Hepatitis C Screening  Completed   HPV Vaccine  Aged Out   Meningitis B Vaccine  Aged Out

## 2023-08-16 ENCOUNTER — Other Ambulatory Visit: Payer: Self-pay | Admitting: Physician Assistant

## 2023-08-16 DIAGNOSIS — R42 Dizziness and giddiness: Secondary | ICD-10-CM

## 2023-08-16 DIAGNOSIS — J3489 Other specified disorders of nose and nasal sinuses: Secondary | ICD-10-CM

## 2023-08-16 DIAGNOSIS — H8113 Benign paroxysmal vertigo, bilateral: Secondary | ICD-10-CM

## 2023-08-21 ENCOUNTER — Ambulatory Visit (INDEPENDENT_AMBULATORY_CARE_PROVIDER_SITE_OTHER): Admitting: Physician Assistant

## 2023-08-21 VITALS — BP 132/76 | HR 80

## 2023-08-21 DIAGNOSIS — F430 Acute stress reaction: Secondary | ICD-10-CM

## 2023-08-21 DIAGNOSIS — H353131 Nonexudative age-related macular degeneration, bilateral, early dry stage: Secondary | ICD-10-CM | POA: Diagnosis not present

## 2023-08-21 DIAGNOSIS — D3131 Benign neoplasm of right choroid: Secondary | ICD-10-CM | POA: Diagnosis not present

## 2023-08-21 DIAGNOSIS — H35033 Hypertensive retinopathy, bilateral: Secondary | ICD-10-CM | POA: Diagnosis not present

## 2023-08-21 DIAGNOSIS — E119 Type 2 diabetes mellitus without complications: Secondary | ICD-10-CM | POA: Diagnosis not present

## 2023-08-21 DIAGNOSIS — G4700 Insomnia, unspecified: Secondary | ICD-10-CM | POA: Diagnosis not present

## 2023-08-21 DIAGNOSIS — F331 Major depressive disorder, recurrent, moderate: Secondary | ICD-10-CM

## 2023-08-21 DIAGNOSIS — H2513 Age-related nuclear cataract, bilateral: Secondary | ICD-10-CM | POA: Diagnosis not present

## 2023-08-21 DIAGNOSIS — H43813 Vitreous degeneration, bilateral: Secondary | ICD-10-CM | POA: Diagnosis not present

## 2023-08-21 DIAGNOSIS — H25013 Cortical age-related cataract, bilateral: Secondary | ICD-10-CM | POA: Diagnosis not present

## 2023-08-21 DIAGNOSIS — H35363 Drusen (degenerative) of macula, bilateral: Secondary | ICD-10-CM | POA: Diagnosis not present

## 2023-08-21 MED ORDER — ALPRAZOLAM 1 MG PO TABS: ORAL_TABLET | ORAL | Status: DC

## 2023-08-21 MED ORDER — BUPROPION HCL ER (XL) 150 MG PO TB24: 150.0000 mg | ORAL_TABLET | Freq: Every day | ORAL | 1 refills | Status: DC

## 2023-08-21 NOTE — Patient Instructions (Signed)
 Increase back up to zoloft  200mg  daily and continue on wellbutrin  150mg  daily.  Go back to work on June 18th.

## 2023-08-23 ENCOUNTER — Encounter: Payer: Self-pay | Admitting: Physician Assistant

## 2023-08-23 DIAGNOSIS — F430 Acute stress reaction: Secondary | ICD-10-CM | POA: Insufficient documentation

## 2023-08-23 NOTE — Progress Notes (Signed)
 Established Patient Office Visit  Subjective   Patient ID: Bianca Mckee, female    DOB: November 01, 1950  Age: 73 y.o. MRN: [id number]  Chief Complaint  Patient presents with   Medical Management of Chronic Issues    Paperwork for work need to be completed to return    HPI Pt is a 73 yo female who presents to the clinic to have forms to have her go back to work without restrictions. She is having trouble with co-worker and getting along. She has reporter co-worker to HR and nothing has been done. One day patient took trazodone  at night and then early morning took xanax . She came into work a little groggy. Co-worker reported her slurring words and stumbling and they drug tested her and sent her home. Drug test was negative for any concerning findings. She now has to have a form filled out so she can go back to work June 18th.    ROS See HPI.    Objective:     BP 132/76   Pulse 80   SpO2 99%  BP Readings from Last 3 Encounters:  08/23/23 132/76  07/24/23 126/80  07/11/23 114/75   Wt Readings from Last 3 Encounters:  08/13/23 132 lb (59.9 kg)  07/24/23 132 lb (59.9 kg)  07/11/23 135 lb (61.2 kg)      Physical Exam Constitutional:      Appearance: Normal appearance.  HENT:     Head: Normocephalic.  Cardiovascular:     Rate and Rhythm: Normal rate and regular rhythm.  Pulmonary:     Effort: Pulmonary effort is normal.     Breath sounds: Normal breath sounds.  Musculoskeletal:     Right lower leg: No edema.     Left lower leg: No edema.  Neurological:     General: No focal deficit present.     Mental Status: She is alert and oriented to person, place, and time.  Psychiatric:        Mood and Affect: Mood normal.    ..    08/23/2023   11:59 AM 08/13/2023    1:20 PM 07/11/2023    4:30 PM 04/29/2023    3:42 PM 03/14/2023    2:27 PM  Depression screen PHQ 2/9  Decreased Interest 3 0 1 3 2   Down, Depressed, Hopeless 2 0 0 3 0  PHQ - 2 Score 5 0 1 6 2   Altered  sleeping 2  1 2 2   Tired, decreased energy 3  1 3 1   Change in appetite 3  0 1 1  Feeling bad or failure about yourself  2  0 2 0  Trouble concentrating 1  0 3 0  Moving slowly or fidgety/restless 0  0 0 0  Suicidal thoughts 1  0 0 0  PHQ-9 Score 17  3 17 6   Difficult doing work/chores   Not difficult at all Somewhat difficult Somewhat difficult   ..    08/23/2023   11:59 AM 07/11/2023    4:31 PM 04/29/2023    3:42 PM 03/14/2023    2:27 PM  GAD 7 : Generalized Anxiety Score  Nervous, Anxious, on Edge 3 1 2 1   Control/stop worrying 3 0 3 0  Worry too much - different things 3 0 3 1  Trouble relaxing 2 1 2 1   Restless 2 0 1 0  Easily annoyed or irritable 3 1 1 1   Afraid - awful might happen 3 0 0 0  Total GAD  7 Score 19 3 12 4   Anxiety Difficulty Very difficult Not difficult at all Somewhat difficult Not difficult at all       The 10-year ASCVD risk score (Arnett DK, et al., 2019) is: 27.4%    Assessment & Plan:  .Aaron AasChaquetta was seen today for medical management of chronic issues.  Diagnoses and all orders for this visit:  Acute stress reaction -     Ambulatory referral to Psychology -     ALPRAZolam  (XANAX ) 1 MG tablet; TAKE 1/2-1 TABLET BY MOUTH DAILY AS NEEDED FOR ANXIETY -     buPROPion  (WELLBUTRIN  XL) 150 MG 24 hr tablet; Take 1 tablet (150 mg total) by mouth daily.  Moderate episode of recurrent major depressive disorder (HCC) -     Ambulatory referral to Psychology -     buPROPion  (WELLBUTRIN  XL) 150 MG 24 hr tablet; Take 1 tablet (150 mg total) by mouth daily.  Insomnia, unspecified type -     ALPRAZolam  (XANAX ) 1 MG tablet; TAKE 1/2-1 TABLET BY MOUTH DAILY AS NEEDED FOR ANXIETY   Acute stress reaction with anxiety and depression due to co-worker relationship problems Her zoloft  was previously decreased go back up to 200mg  daily Consider counseling, referral made Stay on wellbutrin , refilled today Xanax  only as needed and avoid taking it with trazodone . Pt ok  to go back to work on June 18th and paperwork filled out.    Raja Liska, PA-C

## 2023-08-28 ENCOUNTER — Ambulatory Visit

## 2023-08-28 DIAGNOSIS — Z13820 Encounter for screening for osteoporosis: Secondary | ICD-10-CM | POA: Diagnosis not present

## 2023-08-28 DIAGNOSIS — M810 Age-related osteoporosis without current pathological fracture: Secondary | ICD-10-CM | POA: Diagnosis not present

## 2023-08-28 DIAGNOSIS — Z780 Asymptomatic menopausal state: Secondary | ICD-10-CM | POA: Diagnosis not present

## 2023-08-29 ENCOUNTER — Other Ambulatory Visit: Payer: Self-pay | Admitting: Family Medicine

## 2023-08-29 DIAGNOSIS — E1122 Type 2 diabetes mellitus with diabetic chronic kidney disease: Secondary | ICD-10-CM

## 2023-09-11 ENCOUNTER — Encounter: Payer: Self-pay | Admitting: Family Medicine

## 2023-09-18 ENCOUNTER — Encounter: Payer: Self-pay | Admitting: Family Medicine

## 2023-09-18 ENCOUNTER — Ambulatory Visit: Payer: Self-pay | Admitting: Family Medicine

## 2023-09-18 DIAGNOSIS — M810 Age-related osteoporosis without current pathological fracture: Secondary | ICD-10-CM | POA: Insufficient documentation

## 2023-09-18 NOTE — Progress Notes (Signed)
 Hi Autie, your bone density test shows a T-score of my is 2.5 which is consistent with osteoporosis.   The current recommendation for osteoporosis treatment includes:   #1 calcium -total of 1200 mg of calcium  daily.  If you eat a very calcium  rich diet you may be able to obtain that without a supplement.  If not, then I recommend calcium  500 mg twice a day.  There are several products over-the-counter such as Caltrate D and Viactiv chews which are great options that contain calcium  and vitamin D. #2 vitamin D-recommend 800 international units daily. #3 exercise-recommend 30 minutes of weightbearing exercise 3 days a week.  Resistance training ,such as doing bands and light weights, can be particularly helpful. #4 medication-if you are not currently on a bone builder, also called a bisphosphonate, then this has been shown to be very helpful in maintaining bone strength, preventing further thinning of the bones, and reducing your risk for fractures.  I would highly recommend that you consider starting 1 of these medications.  If you are okay with that then please let us  know and we will send one to your pharmacy.  If you would like to discuss further we are happy to make an appointment for you so that we can go over options for treatment.

## 2023-09-21 MED ORDER — ALENDRONATE SODIUM 70 MG PO TABS: 70.0000 mg | ORAL_TABLET | ORAL | 3 refills | Status: AC

## 2023-10-08 DIAGNOSIS — H25813 Combined forms of age-related cataract, bilateral: Secondary | ICD-10-CM | POA: Diagnosis not present

## 2023-10-08 DIAGNOSIS — H353131 Nonexudative age-related macular degeneration, bilateral, early dry stage: Secondary | ICD-10-CM | POA: Diagnosis not present

## 2023-10-08 DIAGNOSIS — H524 Presbyopia: Secondary | ICD-10-CM | POA: Diagnosis not present

## 2023-10-09 ENCOUNTER — Encounter: Payer: Self-pay | Admitting: Family Medicine

## 2023-10-16 ENCOUNTER — Encounter: Payer: Self-pay | Admitting: Family Medicine

## 2023-10-16 DIAGNOSIS — F430 Acute stress reaction: Secondary | ICD-10-CM

## 2023-10-16 DIAGNOSIS — E785 Hyperlipidemia, unspecified: Secondary | ICD-10-CM

## 2023-10-16 DIAGNOSIS — F331 Major depressive disorder, recurrent, moderate: Secondary | ICD-10-CM

## 2023-10-16 DIAGNOSIS — E119 Type 2 diabetes mellitus without complications: Secondary | ICD-10-CM

## 2023-10-16 DIAGNOSIS — G4700 Insomnia, unspecified: Secondary | ICD-10-CM

## 2023-10-16 MED ORDER — ALPRAZOLAM 1 MG PO TABS: ORAL_TABLET | ORAL | 1 refills | Status: DC

## 2023-10-16 MED ORDER — SERTRALINE HCL 100 MG PO TABS: 200.0000 mg | ORAL_TABLET | Freq: Every day | ORAL | 0 refills | Status: DC

## 2023-10-16 MED ORDER — ALPRAZOLAM 1 MG PO TABS: 1.0000 mg | ORAL_TABLET | Freq: Every day | ORAL | 1 refills | Status: DC | PRN

## 2023-10-16 MED ORDER — ATORVASTATIN CALCIUM 80 MG PO TABS: 80.0000 mg | ORAL_TABLET | Freq: Every day | ORAL | 2 refills | Status: AC

## 2023-10-17 ENCOUNTER — Encounter: Payer: Self-pay | Admitting: Family Medicine

## 2023-10-17 DIAGNOSIS — M79641 Pain in right hand: Secondary | ICD-10-CM | POA: Diagnosis not present

## 2023-10-19 ENCOUNTER — Other Ambulatory Visit: Payer: Self-pay | Admitting: Family Medicine

## 2023-10-19 DIAGNOSIS — I10 Essential (primary) hypertension: Secondary | ICD-10-CM

## 2023-10-19 NOTE — Telephone Encounter (Signed)
 Ok for letter requesting walk in showed in place of tub for safety reasons due to Jonit problems, arthritis, and instability

## 2023-10-21 ENCOUNTER — Encounter: Payer: Self-pay | Admitting: Family Medicine

## 2023-10-22 ENCOUNTER — Other Ambulatory Visit: Payer: Self-pay | Admitting: Family Medicine

## 2023-10-22 DIAGNOSIS — I10 Essential (primary) hypertension: Secondary | ICD-10-CM

## 2023-10-22 DIAGNOSIS — N1831 Chronic kidney disease, stage 3a: Secondary | ICD-10-CM

## 2023-10-22 DIAGNOSIS — F331 Major depressive disorder, recurrent, moderate: Secondary | ICD-10-CM

## 2023-11-07 ENCOUNTER — Ambulatory Visit (INDEPENDENT_AMBULATORY_CARE_PROVIDER_SITE_OTHER): Admitting: Family Medicine

## 2023-11-07 VITALS — BP 128/89 | HR 70 | Ht 62.0 in | Wt 136.1 lb

## 2023-11-07 DIAGNOSIS — Z566 Other physical and mental strain related to work: Secondary | ICD-10-CM

## 2023-11-07 DIAGNOSIS — I10 Essential (primary) hypertension: Secondary | ICD-10-CM

## 2023-11-07 DIAGNOSIS — E1122 Type 2 diabetes mellitus with diabetic chronic kidney disease: Secondary | ICD-10-CM | POA: Diagnosis not present

## 2023-11-07 DIAGNOSIS — N1831 Chronic kidney disease, stage 3a: Secondary | ICD-10-CM

## 2023-11-07 DIAGNOSIS — B070 Plantar wart: Secondary | ICD-10-CM

## 2023-11-07 NOTE — Progress Notes (Unsigned)
 Established Patient Office Visit  Subjective  Patient ID: Bianca Mckee, female    DOB: Jan 10, 1951  Age: 73 y.o. MRN: [id number]  Chief Complaint  Patient presents with   Hypertension   Diabetes    HPI Discussed the use of AI scribe software for clinical note transcription with the patient, who gave verbal consent to proceed.  History of Present Illness Bianca Mckee is a 73 year old female who presents with work-related stress and harassment issues.  Occupational stress and harassment - Significant workplace stress due to harassment and bullying by coworkers - Incidents include workspace tampering, such as items being rearranged and a Halloween skeleton placed in her chair - Belief that HR is complicit in the harassment - Job responsibilities have been reduced and reassigned to a less experienced coworker - Required to undergo a fitness for duty test after a negative drug test, following an accusation of being 'wee wobbly' at work, which she attributes to her footwear  Acute anxiety and panic symptoms - Experienced a panic attack related to workplace harassment - Required medication and rest for symptom management  Diabetes mellitus management - Monitors blood glucose every two to three days - Recent blood glucose readings range from 117 to 175 mg/dL - Uncertain of current hemoglobin A1c level  Musculoskeletal and ophthalmologic concerns - Requires hand surgery (details of indication not specified) - Scheduled for cataract surgery in November - Concerned about the timing and impact of surgeries on her work situation  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  - History of a lesion on the right foot, previously treated with over-the-counter wart remover - Lesion has recurred   {History (Optional):23778}  ROS    Objective:     BP 128/89   Pulse 70   Ht 5' 2 (1.575 m)   Wt 136 lb 1.6 oz (61.7 kg)   BMI 24.89 kg/m  {Vitals History (Optional):23777}  Physical  Exam   No results found for any visits on 11/07/23.  {Labs (Optional):23779}  The 10-year ASCVD risk score (Arnett DK, et al., 2019) is: 26%    Assessment & Plan:   Problem List Items Addressed This Visit       Cardiovascular and Mediastinum   HYPERTENSION, BENIGN SYSTEMIC - Primary   Relevant Orders   Urine Microalbumin w/creat. ratio     Endocrine   Type 2 diabetes mellitus with stage 3a chronic kidney disease, without long-term current use of insulin (HCC)   Relevant Orders   Urine Microalbumin w/creat. ratio     Genitourinary   CKD stage G3a/A1, GFR 45-59 and albumin creatinine ratio <30 mg/g (HCC)   Relevant Orders   Urine Microalbumin w/creat. ratio   Other Visit Diagnoses       Plantar wart          Assessment and Plan Assessment & Plan Plantar wart, right foot Recurrent plantar wart causing discomfort when walking. - Apply a bandage to cushion the area and reduce pain while walking.  Work-related stress and panic attacks Significant work-related stress and panic attacks due to workplace bullying and harassment, impacting her ability to work. - Discuss the possibility of a two-week leave for stress-related issues. - Consider extending leave based on surgical schedule and stress management needs.  Type 2 diabetes mellitus with diabetic chronic kidney disease Type 2 diabetes mellitus with diabetic chronic kidney disease. Blood sugar levels generally well-controlled with occasional spikes. - restart metformin .  - due for urine microalubjin.    Essential  hypertension No specific issues with blood pressure were discussed.  Hand pain requiring surgical evaluation Hand pain requiring surgical evaluation. - Referral to a hand surgeon with an appointment scheduled for September 2nd.  Cataract, planned surgery Cataract surgery planned for November with significant visual impairment reported.    Return in about 4 months (around 03/08/2024) for Diabetes  follow-up.    Dorothyann Byars, MD

## 2023-11-08 ENCOUNTER — Encounter: Payer: Self-pay | Admitting: Family Medicine

## 2023-11-09 LAB — MICROALBUMIN / CREATININE URINE RATIO
Creatinine, Urine: 82 mg/dL
Microalb/Creat Ratio: <4 mg/g{creat} (ref 0–29)
Microalbumin, Urine: <3 ug/mL

## 2023-11-10 ENCOUNTER — Ambulatory Visit: Payer: Self-pay | Admitting: Family Medicine

## 2023-11-10 NOTE — Progress Notes (Signed)
 Your lab work is within acceptable range and there are no concerning findings.   ?

## 2023-11-14 DIAGNOSIS — F331 Major depressive disorder, recurrent, moderate: Secondary | ICD-10-CM | POA: Diagnosis not present

## 2023-11-19 DIAGNOSIS — M19041 Primary osteoarthritis, right hand: Secondary | ICD-10-CM | POA: Diagnosis not present

## 2023-11-19 DIAGNOSIS — M1811 Unilateral primary osteoarthritis of first carpometacarpal joint, right hand: Secondary | ICD-10-CM | POA: Diagnosis not present

## 2023-11-25 DIAGNOSIS — N891 Moderate vaginal dysplasia: Secondary | ICD-10-CM | POA: Diagnosis not present

## 2023-11-28 ENCOUNTER — Other Ambulatory Visit: Payer: Self-pay | Admitting: Family Medicine

## 2023-12-23 DIAGNOSIS — N891 Moderate vaginal dysplasia: Secondary | ICD-10-CM | POA: Diagnosis not present

## 2023-12-24 ENCOUNTER — Other Ambulatory Visit: Payer: Self-pay

## 2023-12-24 ENCOUNTER — Encounter: Payer: Self-pay | Admitting: Family Medicine

## 2023-12-24 DIAGNOSIS — R42 Dizziness and giddiness: Secondary | ICD-10-CM

## 2023-12-24 DIAGNOSIS — J3489 Other specified disorders of nose and nasal sinuses: Secondary | ICD-10-CM

## 2023-12-24 DIAGNOSIS — H8113 Benign paroxysmal vertigo, bilateral: Secondary | ICD-10-CM

## 2023-12-24 MED ORDER — FLUTICASONE PROPIONATE 50 MCG/ACT NA SUSP: 2.0000 | Freq: Every day | NASAL | 0 refills | Status: AC

## 2024-02-03 ENCOUNTER — Encounter: Payer: Self-pay | Admitting: Family Medicine

## 2024-03-09 ENCOUNTER — Ambulatory Visit: Admitting: Family Medicine

## 2024-03-17 ENCOUNTER — Ambulatory Visit (INDEPENDENT_AMBULATORY_CARE_PROVIDER_SITE_OTHER): Admitting: Family Medicine

## 2024-03-17 ENCOUNTER — Other Ambulatory Visit: Payer: Self-pay | Admitting: Family Medicine

## 2024-03-17 ENCOUNTER — Encounter: Payer: Self-pay | Admitting: Family Medicine

## 2024-03-17 VITALS — BP 135/73 | HR 62 | Ht 62.0 in | Wt 145.0 lb

## 2024-03-17 DIAGNOSIS — N1831 Chronic kidney disease, stage 3a: Secondary | ICD-10-CM

## 2024-03-17 DIAGNOSIS — K5909 Other constipation: Secondary | ICD-10-CM | POA: Diagnosis not present

## 2024-03-17 DIAGNOSIS — E1122 Type 2 diabetes mellitus with diabetic chronic kidney disease: Secondary | ICD-10-CM

## 2024-03-17 DIAGNOSIS — R140 Abdominal distension (gaseous): Secondary | ICD-10-CM

## 2024-03-17 LAB — POCT GLYCOSYLATED HEMOGLOBIN (HGB A1C): Hemoglobin A1C: 6.1 % — AB (ref 4.0–5.6)

## 2024-03-17 MED ORDER — LINACLOTIDE 145 MCG PO CAPS: 145.0000 ug | ORAL_CAPSULE | Freq: Every day | ORAL | 3 refills | Status: DC

## 2024-03-17 MED ORDER — LUBIPROSTONE 24 MCG PO CAPS: 24.0000 ug | ORAL_CAPSULE | Freq: Two times a day (BID) | ORAL | 1 refills | Status: DC

## 2024-03-17 MED ORDER — RYBELSUS 3 MG PO TABS: 3.0000 mg | ORAL_TABLET | Freq: Every day | ORAL | 0 refills | Status: AC

## 2024-03-17 NOTE — Assessment & Plan Note (Signed)
 She had side effects with the metformin  so couldn't take it, nausea.  Will see if insurance will cover Rybelsus.

## 2024-03-17 NOTE — Progress Notes (Signed)
 "  Established Patient Office Visit  Patient ID: Bianca Mckee, female    DOB: [birth year]  Age: 73 y.o. MRN: [id number] PCP: Alvan Dorothyann BIRCH, MD  Chief Complaint  Patient presents with   Diabetes   Hypertension    Subjective:     HPI  Discussed the use of AI scribe software for clinical note transcription with the patient, who gave verbal consent to proceed.  History of Present Illness Bianca Mckee is a 73 year old female with diabetes who presents for follow-up on A1c and weight management.  Glycemic control - A1c currently 6.1, improved from previous levels - A1c was 5.7 in April, attributed to weight loss at that time  Weight gain and management - Struggling with weight loss despite exercising and dietary modifications - Regained weight after previous loss - Approximately 15 pound weight gain - Unable to fit into previously purchased smaller-sized jeans - Frustrated by continued weight increase despite efforts  Medication intolerance - Unable to tolerate metformin  due to taste - Currently taking magnesium  supplements  Chronic constipation and abdominal bloating - Chronic constipation with infrequent, difficult bowel movements and hard stools - Significant bloating, sometimes described as feeling 'nine months pregnant' and unable to see her feet - Certain foods, such as Italian dishes and wings, exacerbate bloating - Milk of magnesia caused diarrhea - Stool softeners and bloating pill provided no significant relief - Magnesium  supplements do not alleviate bloating - Previously used Linzess  for constipation, which was effective but expensive     ROS    Objective:     BP 135/73   Pulse 62   Ht 5' 2 (1.575 m)   Wt 145 lb (65.8 kg)   SpO2 100%   BMI 26.52 kg/m    Physical Exam Vitals and nursing note reviewed.  Constitutional:      Appearance: Normal appearance.  HENT:     Head: Normocephalic and atraumatic.  Eyes:      Conjunctiva/sclera: Conjunctivae normal.  Cardiovascular:     Rate and Rhythm: Normal rate and regular rhythm.  Pulmonary:     Effort: Pulmonary effort is normal.     Breath sounds: Normal breath sounds.  Skin:    General: Skin is warm and dry.  Neurological:     Mental Status: She is alert.  Psychiatric:        Mood and Affect: Mood normal.      Results for orders placed or performed in visit on 03/17/24  POCT HgB A1C  Result Value Ref Range   Hemoglobin A1C 6.1 (A) 4.0 - 5.6 %   HbA1c POC (<> result, manual entry)     HbA1c, POC (prediabetic range)     HbA1c, POC (controlled diabetic range)        The ASCVD Risk score (Arnett DK, et al., 2019) failed to calculate for the following reasons:   According to ACC/AHA guidelines, patients with an LDL-C level greater than 190 mg/dL (5.08 mmol/L) should be considered for high-intensity statin therapy. This patient's most recent LDL-C level is 208 mg/dL.    Assessment & Plan:   Problem List Items Addressed This Visit       Digestive   Chronic constipation   Relevant Medications   lubiprostone (AMITIZA) 24 MCG capsule   Other Relevant Orders   CMP14+EGFR   CBC with Differential/Platelet     Endocrine   Type 2 diabetes mellitus with stage 3a chronic kidney disease, without long-term current use of insulin (HCC) -  Primary   She had side effects with the metformin  so couldn't take it, nausea.  Will see if insurance will cover Rybelsus.       Relevant Medications   Semaglutide (RYBELSUS) 3 MG TABS   Other Relevant Orders   POCT HgB A1C (Completed)   CMP14+EGFR   CBC with Differential/Platelet   Other Visit Diagnoses       Bloating       Relevant Orders   CMP14+EGFR   CBC with Differential/Platelet       Assessment and Plan Assessment & Plan Type 2 diabetes mellitus with diabetic chronic kidney disease A1c improved to 6.1. Metformin  not tolerated. Considering GLP-1 receptor agonists for weight loss and diabetes  management. Rybelsus preferred due to aversion to injections. - Ordered Rybelsus and sent to pharmacy for insurance approval. - Will ensure prior authorization is obtained due to previous metformin  trial.  Chronic constipation Infrequent bowel movements and bloating. Limited success with current regimen. Linzess  may be covered by insurance. - Recommended Miralax, half a cap mixed with non-carbonated liquid, to be taken nightly until soft bowel movements are achieved. - Ordered Linzess  and sent to pharmacy for insurance approval. - Advised establishing a regular regimen with Linzess , possibly twice a week or more frequently as needed.  Overweight Weight gain despite dietary efforts and exercise. Considering GLP-1 receptor agonists for weight loss. - Ordered Rybelsus and sent to pharmacy for insurance approval.  General health maintenance Has not received flu shot this year. Upcoming eye check scheduled for cataract surgery follow-up. - Encouraged flu vaccination. - Confirmed follow-up for eye check post-cataract surgery.    Return in about 3 months (around 06/15/2024) for Diabetes follow-up.    Dorothyann Byars, MD [facility] at [facility]   "

## 2024-03-17 NOTE — Patient Instructions (Signed)
 If you do well on the Rybelsus we can go up on the dose after one month on it.

## 2024-03-18 ENCOUNTER — Encounter: Payer: Self-pay | Admitting: Family Medicine

## 2024-03-18 ENCOUNTER — Ambulatory Visit: Payer: Self-pay | Admitting: Family Medicine

## 2024-03-18 ENCOUNTER — Other Ambulatory Visit (HOSPITAL_COMMUNITY): Payer: Self-pay

## 2024-03-18 ENCOUNTER — Telehealth: Payer: Self-pay

## 2024-03-18 LAB — CMP14+EGFR
ALT: 9 IU/L (ref 0–32)
AST: 15 IU/L (ref 0–40)
Albumin: 4.3 g/dL (ref 3.8–4.8)
Alkaline Phosphatase: 88 IU/L (ref 49–135)
BUN/Creatinine Ratio: 27 (ref 12–28)
BUN: 25 mg/dL (ref 8–27)
Bilirubin Total: 0.2 mg/dL (ref 0.0–1.2)
CO2: 25 mmol/L (ref 20–29)
Calcium: 9.3 mg/dL (ref 8.7–10.3)
Chloride: 100 mmol/L (ref 96–106)
Creatinine, Ser: 0.94 mg/dL (ref 0.57–1.00)
Globulin, Total: 2.3 g/dL (ref 1.5–4.5)
Glucose: 95 mg/dL (ref 70–99)
Potassium: 3.8 mmol/L (ref 3.5–5.2)
Sodium: 142 mmol/L (ref 134–144)
Total Protein: 6.6 g/dL (ref 6.0–8.5)
eGFR: 65 mL/min/1.73

## 2024-03-18 LAB — CBC WITH DIFFERENTIAL/PLATELET
Basophils Absolute: 0.1 x10E3/uL (ref 0.0–0.2)
Basos: 1 %
EOS (ABSOLUTE): 0.3 x10E3/uL (ref 0.0–0.4)
Eos: 4 %
Hematocrit: 35.1 % (ref 34.0–46.6)
Hemoglobin: 11.3 g/dL (ref 11.1–15.9)
Immature Grans (Abs): 0 x10E3/uL (ref 0.0–0.1)
Immature Granulocytes: 0 %
Lymphocytes Absolute: 2.1 x10E3/uL (ref 0.7–3.1)
Lymphs: 29 %
MCH: 31 pg (ref 26.6–33.0)
MCHC: 32.2 g/dL (ref 31.5–35.7)
MCV: 96 fL (ref 79–97)
Monocytes Absolute: 0.5 x10E3/uL (ref 0.1–0.9)
Monocytes: 7 %
Neutrophils Absolute: 4.3 x10E3/uL (ref 1.4–7.0)
Neutrophils: 58 %
Platelets: 280 x10E3/uL (ref 150–450)
RBC: 3.65 x10E6/uL — ABNORMAL LOW (ref 3.77–5.28)
RDW: 11.6 % — ABNORMAL LOW (ref 11.7–15.4)
WBC: 7.3 x10E3/uL (ref 3.4–10.8)

## 2024-03-18 LAB — LIPID PANEL W/O CHOL/HDL RATIO
Cholesterol, Total: 187 mg/dL (ref 100–199)
HDL: 41 mg/dL
LDL Chol Calc (NIH): 114 mg/dL — ABNORMAL HIGH (ref 0–99)
Triglycerides: 179 mg/dL — ABNORMAL HIGH (ref 0–149)
VLDL Cholesterol Cal: 32 mg/dL (ref 5–40)

## 2024-03-18 NOTE — Telephone Encounter (Signed)
 Pharmacy Patient Advocate Encounter   Received notification from Physician's Office that prior authorization for Lubiprostone 24MCG capsules  is required/requested.   Insurance verification completed.   The patient is insured through [facility].   Per test claim: PA required; PA submitted to above mentioned insurance via Latent Key/confirmation #/EOC [id number] Status is pending

## 2024-03-18 NOTE — Progress Notes (Signed)
 Hi Deysha, hemoglobin is stable.  Cholesterol actually looks much better.  Great work.  Metabolic panel including liver and kidney function is stable.

## 2024-03-18 NOTE — Telephone Encounter (Signed)
 Changes Requested   Name from pharmacy: LUBIPROSTONE 24 MCG CAPSULE     Showing as non-formulary Forwarding to prior auth team to see if they can assist with a PA for this medication

## 2024-03-20 NOTE — Telephone Encounter (Signed)
 Pharmacy Patient Advocate Encounter  Received notification from [facility] that Prior Authorization for Lubiprostone 24mcg caps has been APPROVED from 12/19/23 to 03/18/25   PA #/Case ID/Reference #: [id number]

## 2024-03-20 NOTE — Telephone Encounter (Signed)
 Pharmacy informed of approval. States that runs through insurance but cost is $82.19  she will reach out to patient to see if this is an acceptable cost for the patient.

## 2024-03-24 ENCOUNTER — Encounter: Payer: Self-pay | Admitting: Family Medicine

## 2024-03-24 DIAGNOSIS — N1831 Chronic kidney disease, stage 3a: Secondary | ICD-10-CM

## 2024-03-25 NOTE — Telephone Encounter (Signed)
 Let me see if I can get one of our pharmacist to help ans see what options we have.    Orders Placed This Encounter  Procedures   AMB Referral VBCI Care Management    Referral Priority:   Routine    Referral Type:   Consultation    Referral Reason:   Care Coordination    Number of Visits Requested:   1

## 2024-03-31 ENCOUNTER — Telehealth: Payer: Self-pay | Admitting: *Deleted

## 2024-03-31 NOTE — Progress Notes (Unsigned)
 Care Guide Pharmacy Note  03/31/2024 Name: Bianca Mckee MRN: [id number] DOB: 12-Jun-1950  Referred By: Alvan Dorothyann BIRCH, MD Reason for referral: Call Attempt #1 (Outreach to schedule referral with pharmacist )   Bianca Mckee is a 74 y.o. year old female who is a primary care patient of Alvan, Dorothyann BIRCH, MD.  Bianca Mckee was referred to the pharmacist for assistance related to: DMII  An unsuccessful telephone outreach was attempted today to contact the patient who was referred to the pharmacy team for assistance with medication assistance. Additional attempts will be made to contact the patient.  Thedford Franks, CMA, AAMA [place]  [facility], [facility] Guide, Lead Direct Dial: [phone]  Fax: [phone]

## 2024-04-01 NOTE — Progress Notes (Signed)
 Care Guide Pharmacy Note  04/01/2024 Name: ATONYA TEMPLER MRN: [id number] DOB: [birth year]  Referred By: Alvan Dorothyann BIRCH, MD Reason for referral: Call Attempt #1 (Outreach to schedule referral with pharmacist )   Merlynn VEAR Collet is a 74 y.o. year old female who is a primary care patient of Alvan, Dorothyann BIRCH, MD.  Merlynn VEAR Collet was referred to the pharmacist for assistance related to: DMII  A second unsuccessful telephone outreach was attempted today to contact the patient who was referred to the pharmacy team for assistance with medication management. Additional attempts will be made to contact the patient.  Thedford Franks, CMA, AAMA [place]  [facility], [facility] Guide, Lead Direct Dial: [phone]  Fax: [phone]

## 2024-04-02 NOTE — Progress Notes (Signed)
 Care Guide Pharmacy Note  04/02/2024 Name: Bianca Mckee MRN: [id number] DOB: [birth year]  Referred By: Alvan Dorothyann BIRCH, MD Reason for referral: Call Attempt #1 (Outreach to schedule referral with pharmacist )   Bianca Mckee is a 74 y.o. year old female who is a primary care patient of Alvan, Dorothyann BIRCH, MD.  Bianca Mckee was referred to the pharmacist for assistance related to: DMII  A third unsuccessful telephone outreach was attempted today to contact the patient who was referred to the pharmacy team for assistance with medication assistance. The Population Health team is pleased to engage with this patient at any time in the future upon receipt of referral and should he/she be interested in assistance from the Population Health team.  Thedford Franks, CMA, [facility]  [facility], [facility] Guide, Lead Direct Dial: [phone]  Fax: [phone]

## 2024-04-11 ENCOUNTER — Other Ambulatory Visit: Payer: Self-pay | Admitting: Family Medicine

## 2024-04-11 DIAGNOSIS — K5909 Other constipation: Secondary | ICD-10-CM

## 2024-04-16 ENCOUNTER — Other Ambulatory Visit: Payer: Self-pay | Admitting: Family Medicine

## 2024-04-16 ENCOUNTER — Other Ambulatory Visit: Payer: Self-pay | Admitting: Physician Assistant

## 2024-04-16 DIAGNOSIS — I10 Essential (primary) hypertension: Secondary | ICD-10-CM

## 2024-04-16 DIAGNOSIS — F331 Major depressive disorder, recurrent, moderate: Secondary | ICD-10-CM

## 2024-04-16 DIAGNOSIS — F430 Acute stress reaction: Secondary | ICD-10-CM

## 2024-04-16 NOTE — Telephone Encounter (Signed)
 Requesting rx rf of Buproprion XL 150mg   Last written 08/21/2023 90 day supply with one refill  Last OV 03/17/2024 Upcoming appt 06/16/2024  Refilled per protocol

## 2024-04-22 ENCOUNTER — Encounter: Payer: Self-pay | Admitting: Family Medicine

## 2024-04-22 DIAGNOSIS — G4700 Insomnia, unspecified: Secondary | ICD-10-CM

## 2024-04-22 DIAGNOSIS — F430 Acute stress reaction: Secondary | ICD-10-CM

## 2024-04-22 MED ORDER — ALPRAZOLAM 1 MG PO TABS: ORAL_TABLET | ORAL | 1 refills | Status: AC

## 2024-04-22 NOTE — Telephone Encounter (Signed)
 Meds ordered this encounter  Medications   ALPRAZolam  (XANAX ) 1 MG tablet    Sig: TAKE 1/2-1 TABLET BY MOUTH DAILY AS NEEDED FOR ANXIETY    Dispense:  30 tablet    Refill:  1    Not to exceed 4 additional fills before 07/09/2023

## 2024-06-16 ENCOUNTER — Ambulatory Visit: Admitting: Family Medicine

## 2024-08-13 ENCOUNTER — Ambulatory Visit
